# Patient Record
Sex: Female | Born: 1970 | Hispanic: No | Marital: Single | Smoking: Never smoker
Health system: Southern US, Community
[De-identification: ages and names within clinical notes are randomized; demographics above are authoritative.]

## PROBLEM LIST (undated history)

## (undated) ENCOUNTER — Emergency Department (HOSPITAL_BASED_OUTPATIENT_CLINIC_OR_DEPARTMENT_OTHER)

## (undated) DIAGNOSIS — I5189 Other ill-defined heart diseases: Secondary | ICD-10-CM

## (undated) DIAGNOSIS — F329 Major depressive disorder, single episode, unspecified: Secondary | ICD-10-CM

## (undated) DIAGNOSIS — R011 Cardiac murmur, unspecified: Secondary | ICD-10-CM

## (undated) DIAGNOSIS — R87619 Unspecified abnormal cytological findings in specimens from cervix uteri: Secondary | ICD-10-CM

## (undated) DIAGNOSIS — G479 Sleep disorder, unspecified: Secondary | ICD-10-CM

## (undated) DIAGNOSIS — R519 Headache, unspecified: Secondary | ICD-10-CM

## (undated) DIAGNOSIS — G8929 Other chronic pain: Secondary | ICD-10-CM

## (undated) DIAGNOSIS — F419 Anxiety disorder, unspecified: Secondary | ICD-10-CM

## (undated) DIAGNOSIS — N809 Endometriosis, unspecified: Secondary | ICD-10-CM

## (undated) DIAGNOSIS — F32A Depression, unspecified: Secondary | ICD-10-CM

## (undated) DIAGNOSIS — R51 Headache: Secondary | ICD-10-CM

## (undated) DIAGNOSIS — F429 Obsessive-compulsive disorder, unspecified: Secondary | ICD-10-CM

## (undated) DIAGNOSIS — R000 Tachycardia, unspecified: Secondary | ICD-10-CM

## (undated) DIAGNOSIS — A630 Anogenital (venereal) warts: Secondary | ICD-10-CM

## (undated) DIAGNOSIS — I341 Nonrheumatic mitral (valve) prolapse: Secondary | ICD-10-CM

## (undated) HISTORY — PX: OTHER SURGICAL HISTORY: SHX169

## (undated) HISTORY — DX: Other chronic pain: G89.29

## (undated) HISTORY — DX: Other ill-defined heart diseases: I51.89

## (undated) HISTORY — DX: Unspecified abnormal cytological findings in specimens from cervix uteri: R87.619

## (undated) HISTORY — DX: Depression, unspecified: F32.A

## (undated) HISTORY — DX: Major depressive disorder, single episode, unspecified: F32.9

## (undated) HISTORY — DX: Anxiety disorder, unspecified: F41.9

## (undated) HISTORY — DX: Nonrheumatic mitral (valve) prolapse: I34.1

## (undated) HISTORY — DX: Headache: R51

## (undated) HISTORY — DX: Headache, unspecified: R51.9

## (undated) HISTORY — DX: Sleep disorder, unspecified: G47.9

## (undated) HISTORY — DX: Tachycardia, unspecified: R00.0

## (undated) HISTORY — DX: Obsessive-compulsive disorder, unspecified: F42.9

## (undated) HISTORY — DX: Endometriosis, unspecified: N80.9

## (undated) HISTORY — DX: Cardiac murmur, unspecified: R01.1

## (undated) HISTORY — DX: Anogenital (venereal) warts: A63.0

---

## 1999-07-26 ENCOUNTER — Other Ambulatory Visit: Admission: RE | Admit: 1999-07-26 | Discharge: 1999-07-26 | Payer: Self-pay | Admitting: Obstetrics and Gynecology

## 2003-01-28 HISTORY — PX: FOOT SURGERY: SHX648

## 2003-05-29 ENCOUNTER — Other Ambulatory Visit: Admission: RE | Admit: 2003-05-29 | Discharge: 2003-05-29 | Payer: Self-pay | Admitting: Obstetrics and Gynecology

## 2003-11-13 ENCOUNTER — Encounter (INDEPENDENT_AMBULATORY_CARE_PROVIDER_SITE_OTHER): Payer: Self-pay | Admitting: Specialist

## 2003-11-13 ENCOUNTER — Ambulatory Visit (HOSPITAL_COMMUNITY): Admission: RE | Admit: 2003-11-13 | Discharge: 2003-11-13 | Payer: Self-pay | Admitting: Obstetrics and Gynecology

## 2003-11-13 HISTORY — PX: PELVIC LAPAROSCOPY: SHX162

## 2004-07-23 ENCOUNTER — Other Ambulatory Visit: Admission: RE | Admit: 2004-07-23 | Discharge: 2004-07-23 | Payer: Self-pay | Admitting: *Deleted

## 2005-09-09 ENCOUNTER — Other Ambulatory Visit: Admission: RE | Admit: 2005-09-09 | Discharge: 2005-09-09 | Payer: Self-pay | Admitting: Obstetrics & Gynecology

## 2006-12-21 ENCOUNTER — Other Ambulatory Visit: Admission: RE | Admit: 2006-12-21 | Discharge: 2006-12-21 | Payer: Self-pay | Admitting: Obstetrics & Gynecology

## 2007-01-19 ENCOUNTER — Encounter: Admission: RE | Admit: 2007-01-19 | Discharge: 2007-01-19 | Payer: Self-pay | Admitting: Obstetrics & Gynecology

## 2007-01-28 DIAGNOSIS — R87619 Unspecified abnormal cytological findings in specimens from cervix uteri: Secondary | ICD-10-CM

## 2007-01-28 HISTORY — DX: Unspecified abnormal cytological findings in specimens from cervix uteri: R87.619

## 2007-03-22 ENCOUNTER — Other Ambulatory Visit: Admission: RE | Admit: 2007-03-22 | Discharge: 2007-03-22 | Payer: Self-pay | Admitting: Obstetrics & Gynecology

## 2007-03-28 HISTORY — PX: COLPOSCOPY: SHX161

## 2007-09-08 ENCOUNTER — Other Ambulatory Visit: Admission: RE | Admit: 2007-09-08 | Discharge: 2007-09-08 | Payer: Self-pay | Admitting: Obstetrics & Gynecology

## 2007-09-22 ENCOUNTER — Other Ambulatory Visit: Admission: RE | Admit: 2007-09-22 | Discharge: 2007-09-22 | Payer: Self-pay | Admitting: Obstetrics and Gynecology

## 2008-03-30 ENCOUNTER — Other Ambulatory Visit: Admission: RE | Admit: 2008-03-30 | Discharge: 2008-03-30 | Payer: Self-pay | Admitting: Obstetrics & Gynecology

## 2009-10-26 ENCOUNTER — Ambulatory Visit: Payer: Self-pay | Admitting: Family Medicine

## 2009-10-26 DIAGNOSIS — S0083XA Contusion of other part of head, initial encounter: Secondary | ICD-10-CM | POA: Insufficient documentation

## 2009-10-26 DIAGNOSIS — S0003XA Contusion of scalp, initial encounter: Secondary | ICD-10-CM | POA: Insufficient documentation

## 2009-10-26 DIAGNOSIS — R51 Headache: Secondary | ICD-10-CM | POA: Insufficient documentation

## 2009-10-26 DIAGNOSIS — R519 Headache, unspecified: Secondary | ICD-10-CM | POA: Insufficient documentation

## 2009-10-26 DIAGNOSIS — S1093XA Contusion of unspecified part of neck, initial encounter: Secondary | ICD-10-CM

## 2010-02-26 NOTE — Assessment & Plan Note (Signed)
Summary: NP/HEAD AND FACE INJURY/LP   Vital Signs:  Patient profile:   40 year old female Pulse rate:   103 / minute BP sitting:   121 / 83 BP standing:   127 / 85  History of Present Illness: 40 yo F here for head/face injury  Patient reports that yesterday she had lesions biopsied from face by another physician. Today she was looking at face and felt like she was going to pass out upon seeing blood in these areas. She tried to walk back to her bed but before she could do so, she lost consciousness while standing and fell between washing machine and a sink. Mother stated she heard two thud noises - patient thinks she may have hit back of head on something then the ground. No chest pain, shortness of breath, palpitations prior to this. Thinks at most she was unconscious for 2 minutes. When came to, felt dizzy especially with standing and headache occipital/temporal. Face also hurts - swollen over nasal bridge mildly and has bruised left eye. Able to move eyes completely.  Dizziness is now gone - only with headache. No nausea or vomiting. No confusion. No prior h/o concussion. No changes in vision, speech, numbness/tingling or weakness in extremities. Did say it hurts some to look to the left side in her left eye.  Problems Prior to Update: None  Allergies (verified): 1)  Vicodin (Hydrocodone-Acetaminophen)  Family History: + DM in grandfather.  HTN in grandmother and grandfather.  Negative for heart disease.  Social History: nonsmoker, non drinker.  works in Warehouse manager at [employer]  Physical Exam  General:  mildly anxious but in NAD Head:  Bruising over left upper eyelid.  Small superficial hemostatic laceration over nasal bridge with mild swelling.  Small hemostatic area over right eyebrow (from biopsy yesterday).  Eyes:  PERRL.  EOMI.  No stepoffs or crepitus around orbits Ears:  TMs glossy white without blood. Nose:  No stepoffs, mild swelling.  No crepitation.   Tender bilateral nasal bones.  No epistaxis Mouth:  no loose teeth.  No blood in oropharynx. Lungs:  Normal respiratory effort, chest expands symmetrically. Lungs are clear to auscultation, no crackles or wheezes. Heart:  Mildly tachycardic, regular rhythm. S1 and S2 normal without gallop, murmur, click, rub or other extra sounds. Neurologic:  Alert and oriented x3, CN 2-12 grossly intact, Strength grossly intact.  Sensation and MSRs not tested.  Gait normal.  Romberg negative.  Finger to nose normal.  Normal one leg balance bilaterally.  Memory registration and recall intact.  Able to say months of year backwards and do reverse number sequencing without difficulty.   Impression & Recommendations:  Problem # 1:  HEADACHE (ICD-784.0) Assessment New Patient may have had a mild concussion (her headache and dizziness post-accident indicate this) though aside from headache, all other testing is negative.  Discussed relative rest (does not exercise), try meloxicam or advil for pain (but not both) with tramadol for severe pain.  Ice over nasal bridge and left eye - see instructions.  Discussed red flags (persistent vomiting, confusion) though getting further from window for acute intracranial hemorrhage and exam normal outside of headache.    Her updated medication list for this problem includes:    Meloxicam 15 Mg Tabs (Meloxicam) .Marland Kitchen... 1 tab by mouth daily with food for pain and inflammation    Tramadol Hcl 50 Mg Tabs (Tramadol hcl) .Marland Kitchen... 1 tab by mouth qid as needed severe pain  Problem # 2:  CONTUSION,  FACE (ICD-920) Assessment: New Discussed if she had a small crack in nasal bones, may not be seen initially due to swelling.  No indication on exam of obvious fracture requiring reduction at this time (and discussed would wait 1 week before attempting reduction if one was present so swelling could subside).  Meloxicam, tramadol, icing.  If appears asymmetric in 1 week, to return for reevaluation of  possible nasal fracture - we discussed x-rays would then be attempted but are difficult at assessing subtle nasal fractures.  Complete Medication List: 1)  Meloxicam 15 Mg Tabs (Meloxicam) .Marland Kitchen.. 1 tab by mouth daily with food for pain and inflammation 2)  Tramadol Hcl 50 Mg Tabs (Tramadol hcl) .Marland Kitchen.. 1 tab by mouth qid as needed severe pain 3)  Yaz 3-0.02 Mg Tabs (Drospirenone-ethinyl estradiol) 4)  Glucosamine 500 Mg Caps (Glucosamine sulfate) 5)  Daily Multiple Vitamins Tabs (Multiple vitamin) 6)  Ativan 0.5 Mg Tabs (Lorazepam)  Patient Instructions: 1)  You had a vasovagal reaction (passing out from benign causes) and may have had a mild concussion that has since cleared by testing. 2)  Take meloxicam 15mg  daily with food (do not take advil with this - you can take advil 600mg  every 6 hours INSTEAD of this though if you choose). 3)  Ice your nasal bridge for 15 minutes at a time 3-4 times a day at least - put a cloth under the ice. 4)  Take tramadol as needed for severe pain - if this makes you sleepy do not drive while taking this medicine.  Start by splitting this in half to make sure it doesn't make you nauseous. 5)  You do not have evidence of a nasal or orbital fracture on exam or intracranial injury.  Sometimes a small crack in nasal bones is not felt on exam or appreciated because of swelling - if this looks asymmetric in a week when the swelling goes down, make an appointment to come back and see me.  Otherwise follow up with me as needed. 6)  It may take 2-3 weeks for the bruising over your eye to go completely away. Prescriptions: TRAMADOL HCL 50 MG TABS (TRAMADOL HCL) 1 tab by mouth qid as needed severe pain  #60 x 0   Entered and Authorized by:   Norton Blizzard MD   Signed by:   Norton Blizzard MD on 10/26/2009   Method used:   Print then Give to Patient   RxID:   [id number] MELOXICAM 15 MG TABS (MELOXICAM) 1 tab by mouth daily with food for pain and inflammation  #30 x 1    Entered and Authorized by:   Norton Blizzard MD   Signed by:   Norton Blizzard MD on 10/26/2009   Method used:   Print then Give to Patient   RxID:   [id number]

## 2010-03-15 ENCOUNTER — Ambulatory Visit: Payer: Self-pay | Admitting: Family Medicine

## 2010-03-15 ENCOUNTER — Telehealth: Payer: Self-pay | Admitting: Family Medicine

## 2010-03-20 ENCOUNTER — Telehealth: Payer: Self-pay | Admitting: Family Medicine

## 2010-03-20 DIAGNOSIS — F0781 Postconcussional syndrome: Secondary | ICD-10-CM | POA: Insufficient documentation

## 2010-03-20 NOTE — Progress Notes (Signed)
  Phone Note Call from Patient   Caller: Patient Summary of Call: Spoke with patient - since injury she has continued to have headaches, most recently associated with blurry vision.  Also with panic attacks.  She was going to come in today but instead was seen in past week at [facility] - states they did CT scan of head which was negative and prescribed nortriptyline which patient did not take because of risk of swelling.  She is having records faxed here but they have not arrived yet.  Also saw her ophathlmologist and eyes appeared normal per her report.  Advised her I would call her after reviewing records on how to proceed - likely with neurology referral as next step. Initial call taken by: Norton Blizzard MD,  March 15, 2010 4:25 PM

## 2010-03-26 NOTE — Progress Notes (Signed)
  Phone Note Outgoing Call   Call placed by: Norton Blizzard MD,  March 20, 2010 10:23 AM Summary of Call: Spoke with patient while she was at work.  She has been having headaches, blurred vision, dizziness off and on worse since her fall at home back in September.  Reviewed her records from [place] at [facility].  Neurologic exam appears normal.  CT scan without contrast was negative.  She was given a script for nortriptyline for probable migraines but did not take because of side effects.  Since scan she thinks symptoms are somewhat better but only partially.  Ophthalmologic exam normal as well.  Advised I feel she should see a neurologist at this point given length of symptoms and having more than just headaches.  Likely postconcussive syndrome with underlying migraine history as well.  She has been to [facility] in past - will refer back to them. 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

## 2010-04-04 ENCOUNTER — Telehealth: Payer: Self-pay | Admitting: Family Medicine

## 2010-04-09 NOTE — Progress Notes (Signed)
Summary: Wants second neurology referral  ---- 04/03/2010 12:29 PM, Judge Stall wrote: Received phone call at 12:10 today wanting another neurology referral - this time to [facility].  I explained that we had already referred her to a neurologist and that if she wanted further referral that she needed to contact her primary care physician. ------------------------------ Patient has called office on a couple occasions since her neurology consultation with [facility] which was last week.  At that visit she was diagnosed with chronic post-traumatic headache.  She was offered four medications to help treat this (elavil, neurontin, topamax, tizanadine) but declined all of these stating she was concerned about side effects.  They noted if she were to change her mind they would prescribe any to help with her symptoms.  Patient wants a second opinion from [facility] - we had arranged her referral for this location initially but she wanted a sooner appointment so we called several neurology groups in the area and were able to get her in with the group that she saw last week.  Advised her if she would like a second opinion she will have to contact her PCP or contact the neurology group she saw to consider medications that were discussed.  Norton Blizzard MD  April 04, 2010 8:42 AM

## 2010-06-14 NOTE — Op Note (Signed)
NAMEMARYAN, Frances Ho               ACCOUNT NO.:  [id number]   MEDICAL RECORD NO.:  [id number]          PATIENT TYPE:  AMB   LOCATION:  [facility]                           FACILITY:  [facility]   PHYSICIAN:  Cynthia P. Romine, M.D.DATE OF BIRTH:  October 01, 1970   DATE OF PROCEDURE:  11/13/2003  DATE OF DISCHARGE:                                 OPERATIVE REPORT   PREOPERATIVE DIAGNOSES:  Pelvic and leg pain and right ovarian complex cyst  consistent with endometriosis.   POSTOPERATIVE DIAGNOSES:  Bilateral endometriomas, extensive adhesions  involving the pelvis.   PROCEDURE:  Diagnostic laparoscopy, removal of bilateral endometriomas,  extensive lysis of adhesions.   SURGEON:  Cynthia P. Romine, M.D.   ASSISTANT:  Andres Ege, M.D.   ANESTHESIA:  General endotracheal.   ESTIMATED BLOOD LOSS:  50 mL.   COMPLICATIONS:  None.   DESCRIPTION OF PROCEDURE:  The patient was taken to the operating room and  after the induction of adequate general endotracheal anesthesia was prepped  and draped in the usual fashion, placed in the dorsal lithotomy position.  The bladder was drained with a Foley catheter. An acorn uterine manipulator  was placed. Attention was next turned to the abdomen and vertical  infraumbilical incision was made. The Veress needle was inserted into the  peritoneal space. Proper placement was tested by noting a negative aspirate  through the Veress needle with free flow of saline through the Veress needle  again with a negative aspirate and then by noting the response of saline  placed at the hub of the Veress needle to negative pressure as the abdominal  wall was elevated. Pneumoperitoneum was created with 2 liters of CO2 using  the automatic insufflator. A disposable 10/11 mm trocar was then inserted  into the peritoneal space and its proper placement noted with the  laparoscope. Next, two small 5 mm suprapubic incisions were made and the  lower trocars were inserted  under direct visualization. The pelvis was  inspected, the left ovary was enlarged and was adherent to the fundus of the  uterus. The tube was dilated from its proximal portion all the way out to  the fimbria although the fimbria appeared normal and were not clubbed. The  right ovary was socked into the cul-de-sac and adherent to the right pelvic  sidewall, the right tube appeared normal. The cul-de-sac was approximately  50% obliterated with adhesions. The anterior cul-de-sac was free.  The  appendix was enlarged and was filled with a white hard material that made  the appendix round rather than oblong like it is usually. Photographic  documentation was taken.  In order to begin the procedure, it is necessary  to begin with lysing adhesions between the colon and the fundus and the left  ovary. The harmonic scalpel was used to accomplish that goal, lysing  adhesions from the small and large bowel especially on the left between the  bowel and the posterior aspect of the fundus and the ovary that was adherent  to the fundus.  Once the bowel could be pushed away, the ovary could be seen  to be densely adherent along its apex to the fundus of the uterus.  Blunt  and sharp dissection were used to remove the ovary off the fundus of the  uterus and in the process of doing so, the endometrioma in that ovary was  ruptured and exuded a moderately large amount of chocolate brown fluid.  Once the ovary was off the uterus, the cul-de-sac became visible, it was  obliterated about 50% with adhesions. These were taken down careful with  meticulous dissection using the harmonic scalpel.  Once the adhesions in the  cul-de-sac were free then the right ovary became visible. It was densely  adherent to the right pelvic sidewall and contained an endometrioma which  also during the dissection of the ovary off the right pelvic sidewall was  ruptured and exuded and a moderate amount of chocolate brown fluid. Once  the  endometrioma had been ruptured, the ovary could be grasped at the site of  the rupture of the endometrioma. Grasping the ovary and pulling it away from  the sidewall to provide traction so that a plane could be developed between  the sidewall and the ovary, the large vessels could be seen separately and  rolled out of the field as was the ureter. Tedious and meticulous dissection  was used to get the ovary free from the right pelvic sidewall and once the  ovary could be mobilized up it could be seen that approximately 33% of the  ovary had been involved in this endometrioma.  The harmonic scalpel was used  to excise the endometrioma and the specimen was sent to pathology. Good  hemostasis was noted. On the left ovary, the endometrioma involved  approximately a third of the ovary and again the harmonic scalpel was used  to excise the part of the ovary containing the endometrioma.  The base of  the endometrioma was cauterized with bipolar cautery and bipolar was used to  achieve hemostasis on the left ovary.  The cul-de-sac was inspected and  there were raw edges but the cul-de-sac was free and the uterosacral  ligaments on both sides could be visualized.  The anterior cul-de-sac  appeared normal. Intercede was introduced into the pelvis and was placed  over the defect in the right and left ovary. Photographic documentation was  taken and the procedure was terminated. The instruments removed from the  abdomen, the pneumoperitoneum was allowed to escape, the sleeves were  removed, the incisions were closed subcuticularly with 4-0 Vicryl Rapide,  instruments removed from the vagina and the procedure was terminated. The  patient was taken to the recovery room in satisfactory condition.      CPR/MEDQ  D:  11/13/2003  T:  11/14/2003  Job:  [id number]

## 2010-06-21 ENCOUNTER — Encounter: Payer: Self-pay | Admitting: Emergency Medicine

## 2010-07-03 ENCOUNTER — Ambulatory Visit (INDEPENDENT_AMBULATORY_CARE_PROVIDER_SITE_OTHER): Payer: 59 | Admitting: Emergency Medicine

## 2010-07-03 ENCOUNTER — Encounter: Payer: Self-pay | Admitting: Emergency Medicine

## 2010-07-03 DIAGNOSIS — J302 Other seasonal allergic rhinitis: Secondary | ICD-10-CM

## 2010-07-03 DIAGNOSIS — R0989 Other specified symptoms and signs involving the circulatory and respiratory systems: Secondary | ICD-10-CM

## 2010-07-03 DIAGNOSIS — J309 Allergic rhinitis, unspecified: Secondary | ICD-10-CM

## 2010-07-03 DIAGNOSIS — R0609 Other forms of dyspnea: Secondary | ICD-10-CM

## 2010-07-03 DIAGNOSIS — R0600 Dyspnea, unspecified: Secondary | ICD-10-CM

## 2010-07-03 NOTE — Assessment & Plan Note (Signed)
Suspect some deconditioning and restriction from wearing back brace. ? Also component of nasal obstruction and inability to move air thru nasal passages.  - check full PFT - rov to discuss - she wants to defer allergy meds at this time

## 2010-07-03 NOTE — Progress Notes (Signed)
  Subjective:    Patient ID: Frances Ho, female    DOB: 09-Jun-1970, 40 y.o.   MRN: [id number]  HPI    Review of Systems  HENT: Positive for congestion.   Respiratory: Positive for shortness of breath.   Gastrointestinal: Positive for abdominal pain.  Musculoskeletal: Positive for joint swelling.  Neurological: Positive for headaches.  Psychiatric/Behavioral:       Depression anxiety       Objective:   Physical Exam        Assessment & Plan:

## 2010-07-03 NOTE — Progress Notes (Signed)
  Subjective:    Patient ID: Frances Ho, female    DOB: [birth year], 40 y.o.   MRN: [id number]  HPI 40 yo never smoker, hx trauma and head injury, post-concussion HA's, MV prolapse, tells me that she noticed DOE over about 5 months, ? Associated with tachycardia, occurs also at rest, esp when lying down - ? Whether she has sinus or nasal obstruction contributing. The exertional SOB seems to be worsening, she was worse when walking yesterday. She wears a back brace that is like a belt, compresses her abdomen and may be contributing. Has gained 15 lbs in the last year. CXR done 5/29 was normal.  She notes that her exercise routine decreased and that deconditioning could be conditioning. She does have some allergy symptoms.    Review of Systems As per HPI  Past Medical History  Diagnosis Date  . Chronic headaches   . Tachycardia      Family History  Problem Relation Age of Onset  . Diabetes      grandfather  . Hypertension      grandmother  . Hypertension      grandfather  . Heart disease Maternal Grandmother   . Heart disease Maternal Grandfather   . Heart disease Paternal Grandmother   . Heart disease Paternal Grandfather      History   Social History  . Marital Status: Single    Spouse Name: N/A    Number of Children: N/A  . Years of Education: N/A   Occupational History  . clerical at [employer]    Social History Main Topics  . Smoking status: Never Smoker   . Smokeless tobacco: Not on file  . Alcohol Use: No  . Drug Use: No  . Sexually Active: Not on file   Other Topics Concern  . Not on file   Social History Narrative  . No narrative on file     No Known Allergies   Outpatient Prescriptions Prior to Visit  Medication Sig Dispense Refill  . Glucosamine 500 MG CAPS Take 750 mg by mouth daily.       . Multiple Vitamin (MULTIVITAMIN) tablet        . drospirenone-ethinyl estradiol (YAZ) 3-0.02 MG per tablet        . LORazepam (ATIVAN) 0.5 MG tablet as  needed.        . meloxicam (MOBIC) 15 MG tablet Take 15 mg by mouth daily.        . traMADol (ULTRAM) 50 MG tablet Take 50 mg by mouth every 6 (six) hours as needed.             Objective:   Physical Exam  Gen: Pleasant, well-nourished, in no distress,  normal affect  ENT: No lesions,  mouth clear,  oropharynx clear, no postnasal drip  Neck: No JVD, no TMG, no carotid bruits  Lungs: No use of accessory muscles, no dullness to percussion, clear without rales or rhonchi  Cardiovascular: RRR, heart sounds normal, no murmur or gallops, no peripheral edema  Musculoskeletal: No deformities, no cyanosis or clubbing  Neuro: alert, non focal  Skin: Warm, no lesions or rashes      Assessment & Plan:  Dyspnea Suspect some deconditioning and restriction from wearing back brace. ? Also component of nasal obstruction and inability to move air thru nasal passages.  - check full PFT - rov to discuss - she wants to defer allergy meds at this time

## 2010-07-03 NOTE — Patient Instructions (Signed)
We will perform full pulmonary function testing on the same day as your next appointment Follow with Dr Delton Coombes next available opening

## 2010-07-30 ENCOUNTER — Encounter: Payer: Self-pay | Admitting: Emergency Medicine

## 2010-07-30 ENCOUNTER — Ambulatory Visit (INDEPENDENT_AMBULATORY_CARE_PROVIDER_SITE_OTHER): Payer: 59 | Admitting: Emergency Medicine

## 2010-07-30 ENCOUNTER — Encounter: Payer: Self-pay | Admitting: *Deleted

## 2010-07-30 DIAGNOSIS — G4733 Obstructive sleep apnea (adult) (pediatric): Secondary | ICD-10-CM | POA: Insufficient documentation

## 2010-07-30 DIAGNOSIS — R0989 Other specified symptoms and signs involving the circulatory and respiratory systems: Secondary | ICD-10-CM

## 2010-07-30 DIAGNOSIS — G479 Sleep disorder, unspecified: Secondary | ICD-10-CM

## 2010-07-30 DIAGNOSIS — R0600 Dyspnea, unspecified: Secondary | ICD-10-CM

## 2010-07-30 DIAGNOSIS — R0609 Other forms of dyspnea: Secondary | ICD-10-CM

## 2010-07-30 HISTORY — DX: Sleep disorder, unspecified: G47.9

## 2010-07-30 LAB — PULMONARY FUNCTION TEST

## 2010-07-30 NOTE — Patient Instructions (Signed)
Your pulmonary function testing is normal We will arrange for a home sleep test Follow up with Dr Delton Coombes in 6 weeks to review the test

## 2010-07-30 NOTE — Progress Notes (Signed)
  Subjective:    Patient ID: Frances Ho, female    DOB: 04-08-1970, 40 y.o.   MRN: [id number]  HPI 40 yo never smoker, hx trauma and head injury, post-concussion HA's, MV prolapse, tells me that she noticed DOE over about 5 months, ? Associated with tachycardia, occurs also at rest, esp when lying down - ? Whether she has sinus or nasal obstruction contributing. The exertional SOB seems to be worsening, she was worse when walking yesterday. She wears a back brace that is like a belt, compresses her abdomen and may be contributing. Has gained 15 lbs in the last year. CXR done 5/29 was normal.  She notes that her exercise routine decreased and that deconditioning could be conditioning. She does have some allergy symptoms.   ROV 07/30/10 -- returns after PFT's today. She tells me that she is feeling much better, since last time she stopped using the back brace and her breathing has improved. As an aside she tells me that she is having sleep sx - daytime somnulence, poor sleep, etc. PFT today are normal.   Review of Systems As per HPI    Objective:   Physical Exam  Gen: Pleasant, well-nourished, in no distress,  normal affect  ENT: No lesions,  mouth clear,  oropharynx clear, no postnasal drip  Neck: No JVD, no TMG, no carotid bruits  Lungs: No use of accessory muscles, no dullness to percussion, clear without rales or rhonchi  Cardiovascular: RRR, heart sounds normal, no murmur or gallops, no peripheral edema  Musculoskeletal: No deformities, no cyanosis or clubbing  Neuro: alert, non focal  Skin: Warm, no lesions or rashes      Assessment & Plan:   Dyspnea PFT's normal, suspect restriction due to her back brace, which she is no longer using.   Sleep disturbance Will arrange for a home PSG

## 2010-07-30 NOTE — Progress Notes (Signed)
PFT done today. 

## 2010-07-30 NOTE — Assessment & Plan Note (Signed)
Will arrange for a home PSG

## 2010-07-30 NOTE — Assessment & Plan Note (Signed)
PFT's normal, suspect restriction due to her back brace, which she is no longer using.

## 2010-08-15 ENCOUNTER — Ambulatory Visit (INDEPENDENT_AMBULATORY_CARE_PROVIDER_SITE_OTHER): Payer: 59 | Admitting: Pulmonary Disease

## 2010-08-15 DIAGNOSIS — G479 Sleep disorder, unspecified: Secondary | ICD-10-CM

## 2010-08-16 NOTE — Progress Notes (Signed)
  Subjective:    Patient ID: Frances Ho, female    DOB: [birth year], 40 y.o.   MRN: [id number]  HPI  Home sleep study (Apnealink) reviewed : Flow evaluation x 7h AHI 1/h Desaturation index 3/h 10 min saturation < 88% Impression- No evidence of sleep disordered breathing. Mild sleep related hypoxia - please corelate with underlying cardiopulmonary disease.  Review of Systems     Objective:   Physical Exam        Assessment & Plan:

## 2010-08-20 ENCOUNTER — Encounter: Payer: Self-pay | Admitting: Emergency Medicine

## 2010-08-22 ENCOUNTER — Telehealth: Payer: Self-pay | Admitting: Emergency Medicine

## 2010-08-22 NOTE — Telephone Encounter (Signed)
Called and spoke with pt.  Pt states she dropped off apnea link last week and still hasn't heard results yet.  Pt requesting these results asap.  RB, please advise.  Thanks.

## 2010-08-23 NOTE — Telephone Encounter (Signed)
Pt called again for sleep results.Frances Ho

## 2010-08-23 NOTE — Telephone Encounter (Signed)
Called spoke with patient, advised of apnea link results / recs as stated by CDY.  Pt verbalized her understanding but still expressed concern because she wakes up exhausted every morning and reports that it takes her "a long time to become oriented."  Pt states that she wakes up usually once per night to use the bathroom and gets approx 6-7 hours of sleep per night.  Pt requesting now to see sleep physician and would like appt before her scheduled follow up with RB on 8.20.12.  appt for self-referral made with VS for 8.14.12 per pt's request.

## 2010-08-23 NOTE — Telephone Encounter (Signed)
Called spoke with patient who is requesting her apnea link results be read ASAP.  She is becoming concerned and wants this taken care of today.  Advised that RB is not in the office and neither is KC who pt states read the report.  Pt asked why I cannot give her the results and I informed her that only the provider can tell the nurse what the results are for [place] to relay them to the patient.  Pt verbalized her understanding but again requests this be taken care of today.  Dr Maple Hudson, as the only sleep physician in the office can you read pt's apnea link and provide results?  Thanks!  Copy of apnea link printed off and placed with phone note.

## 2010-08-23 NOTE — Telephone Encounter (Signed)
Apnea link does not show significant sleep apnea. Oxygen level fell for short amounts of time, but no emergency,and she can discuss that with Dr Delton Coombes next week.

## 2010-09-10 ENCOUNTER — Ambulatory Visit (INDEPENDENT_AMBULATORY_CARE_PROVIDER_SITE_OTHER): Payer: 59 | Admitting: Pulmonary Disease

## 2010-09-10 ENCOUNTER — Encounter: Payer: Self-pay | Admitting: *Deleted

## 2010-09-10 ENCOUNTER — Encounter: Payer: Self-pay | Admitting: Pulmonary Disease

## 2010-09-10 VITALS — BP 102/58 | HR 94 | Temp 98.6°F | Ht 62.0 in | Wt 149.0 lb

## 2010-09-10 DIAGNOSIS — G479 Sleep disorder, unspecified: Secondary | ICD-10-CM

## 2010-09-10 NOTE — Progress Notes (Signed)
Subjective:    Patient ID: Frances Ho, female    DOB: [birth year], 40 y.o.   MRN: [id number]  HPI 40 yo female with sleep trouble.  She has noticed a problem with her sleep for years.  This has been getting worse since she fell and hit her head in Sept 2011.  She is not sure if she had a concussion then.  She does get frequent headaches.  She denies any history of seizures.  She had CT head in February 2012, and was told this was normal.  She had home sleep test in July 2012; this showed AHI 1 and spent 10 min with SpO2 < 88%.  She is does not recall sleeping much that night.  She was worried about knocking the device off while asleep.  She goes to bed at 10 pm.  She will take ativan once per week.  She has also tried using OTC sleep aides.  These help, but she gets a hangover affect the next morning.  It can take up to an hour to fall asleep.  She wakes up several times during the night, especially when sleeping on her back.  She drools at night and her mouth will get dry.  She sleeps alone, and is not sure if she snores or stops breathing while asleep.  She gets out of bed at 645 am, but feels lousy.  She is tired throughout the day.  She is not using anything to help stay awake.  She naps occasionally on the weekends when she has time.  She will still feel tired even if she can sleep for 12 hours.  She used to grind her teeth as a child, but is not sure if she does this as an adult.  The patient denies sleep walking, sleep talking, or nightmares.  There is no history of restless legs.  The patient denies sleep hallucinations, sleep paralysis, or cataplexy.  She does occasional get sinus congestion and allergies.  There is no history of thyroid disease.  She will occasionally feel depressed, but never have been diagnosed with depression.  She is often late for work due to oversleeping.  Epworth score is 11 out of 24.  Past Medical History  Diagnosis Date  . Chronic headaches   . Tachycardia     . Mitral valve prolapse      Family History  Problem Relation Age of Onset  . Diabetes      grandfather  . Hypertension      grandmother  . Hypertension      grandfather  . Heart disease Maternal Grandmother   . Heart disease Maternal Grandfather   . Heart disease Paternal Grandmother   . Heart disease Paternal Grandfather      History   Social History  . Marital Status: Single    Spouse Name: N/A    Number of Children: N/A  . Years of Education: N/A   Occupational History  . clerical at [employer]    Social History Main Topics  . Smoking status: Never Smoker   . Smokeless tobacco: Not on file  . Alcohol Use: No  . Drug Use: No  . Sexually Active: Not on file   Other Topics Concern  . Not on file   Social History Narrative  . No narrative on file     No Known Allergies   Outpatient Prescriptions Prior to Visit  Medication Sig Dispense Refill  . Calcium Carbonate-Vitamin D (CALCIUM 600+D) 600-400 MG-UNIT per tablet  Take 1 tablet by mouth 2 (two) times daily.        . Cholecalciferol (VITAMIN D3) 1000 UNITS CAPS Take 1 capsule by mouth daily.        . drospirenone-ethinyl estradiol (YAZ) 3-0.02 MG per tablet        . Flaxseed, Linseed, (FLAXSEED OIL) 1000 MG CAPS Take 1 capsule by mouth daily.        . folic acid (FOLVITE) 800 MCG tablet Take 800 mcg by mouth daily.        . Glucosamine 500 MG CAPS Take 750 mg by mouth daily.       Marland Kitchen LORazepam (ATIVAN) 0.5 MG tablet as needed.        . Methylsulfonylmethane (MSM) 1000 MG CAPS Take 1 capsule by mouth daily.        . Multiple Vitamin (MULTIVITAMIN) tablet        . Multiple Vitamins-Minerals (HAIR/SKIN/NAILS) TABS Take 2 tablets by mouth daily.        . vitamin E 400 UNIT capsule Take 400 Units by mouth daily.         Review of Systems  Constitutional: Positive for unexpected weight change.  HENT: Positive for congestion.   Respiratory: Positive for shortness of breath.   Cardiovascular: Positive for chest  pain.  Gastrointestinal: Positive for abdominal pain.  Musculoskeletal: Positive for joint swelling.  Neurological: Positive for headaches.  Psychiatric/Behavioral: Positive for dysphoric mood.       Objective:   Physical Exam  BP 102/58  Pulse 94  Temp(Src) 98.6 F (37 C) (Oral)  Ht 5\' 2"  (1.575 m)  Wt 149 lb (67.586 kg)  BMI 27.25 kg/m2  SpO2 100%  General - Healthy HEENT - PERRLA, EOMI, narrow nasal angles, no sinus drainage, MP 4, enlarged tongue, no LAN, no thyromegaly Cardiac - s1s2 with 2/6 SM, pulses symmetric Chest - CTA Abd - thin, soft, nontender, normal bowel sounds Ext - no e/c/c Neuro - normal strength, CN intact Psych - normal mood/behavior     Assessment & Plan:   Sleep disturbance She has sleep disruption and daytime sleepiness.  She has frequent headaches.  She has physical findings suggestive of sleep apnea.  She had recent home sleep test that had AHI in normal range, but she reports not sleeping much while wearing device.  Therefore her sleep apnea severity may be underestimated.  She did have oxygen desaturation while using home sleep test device.  Her recent pulmonary evaluation did not show an alternative explanation for her nocturnal oxygen desaturation.  I am concerned that she does in fact have sleep apnea.  To further assess I will arrange for an in-lab sleep study.  Depending on results will determine if additional testing is required.    Updated Medication List Outpatient Encounter Prescriptions as of 09/10/2010  Medication Sig Dispense Refill  . Calcium Carbonate-Vitamin D (CALCIUM 600+D) 600-400 MG-UNIT per tablet Take 1 tablet by mouth 2 (two) times daily.        . Cholecalciferol (VITAMIN D3) 1000 UNITS CAPS Take 1 capsule by mouth daily.        . drospirenone-ethinyl estradiol (YAZ) 3-0.02 MG per tablet        . Flaxseed, Linseed, (FLAXSEED OIL) 1000 MG CAPS Take 1 capsule by mouth daily.        . folic acid (FOLVITE) 800 MCG tablet Take  800 mcg by mouth daily.        . Glucosamine 500 MG CAPS Take 750 mg  by mouth daily.       Marland Kitchen LORazepam (ATIVAN) 0.5 MG tablet as needed.        . Methylsulfonylmethane (MSM) 1000 MG CAPS Take 1 capsule by mouth daily.        . Multiple Vitamin (MULTIVITAMIN) tablet        . Multiple Vitamins-Minerals (HAIR/SKIN/NAILS) TABS Take 2 tablets by mouth daily.        . vitamin E 400 UNIT capsule Take 400 Units by mouth daily.

## 2010-09-10 NOTE — Patient Instructions (Signed)
Will schedule sleep test Will call to schedule follow up after sleep test reviewed 

## 2010-09-10 NOTE — Assessment & Plan Note (Signed)
She has sleep disruption and daytime sleepiness.  She has frequent headaches.  She has physical findings suggestive of sleep apnea.  She had recent home sleep test that had AHI in normal range, but she reports not sleeping much while wearing device.  Therefore her sleep apnea severity may be underestimated.  She did have oxygen desaturation while using home sleep test device.  Her recent pulmonary evaluation did not show an alternative explanation for her nocturnal oxygen desaturation.  I am concerned that she does in fact have sleep apnea.  To further assess I will arrange for an in-lab sleep study.  Depending on results will determine if additional testing is required.

## 2010-09-16 ENCOUNTER — Ambulatory Visit: Payer: 59 | Admitting: Emergency Medicine

## 2010-09-26 ENCOUNTER — Telehealth: Payer: Self-pay | Admitting: Pulmonary Disease

## 2010-09-26 NOTE — Telephone Encounter (Signed)
Spoke with pt and she states the first sleep study she had she was advised to would be covered. Pt states she received a bill for $500. Pt states she had to send the bill to coding so they can get this taking care of. Insurance advised pt the reason she received a bill was b/c not enough medical documentation was submitted to the insurance. Pt is requesting this be done this go around so she doesn't have to go through that again. Pt states [facility] advised her they would send everything the insurance would need to cover her sleep study cost. Please advise [facility] if we sent supporting documents for pt sleep study to the insurance. Thanks  Carver Fila, CMA

## 2010-09-26 NOTE — Telephone Encounter (Signed)
Called [facility] and got # to fax records for precert for split night npsg 10/04/10

## 2010-10-04 ENCOUNTER — Ambulatory Visit (HOSPITAL_BASED_OUTPATIENT_CLINIC_OR_DEPARTMENT_OTHER): Payer: 59

## 2010-10-20 ENCOUNTER — Encounter (HOSPITAL_BASED_OUTPATIENT_CLINIC_OR_DEPARTMENT_OTHER): Payer: 59

## 2010-10-23 ENCOUNTER — Ambulatory Visit (HOSPITAL_BASED_OUTPATIENT_CLINIC_OR_DEPARTMENT_OTHER): Payer: 59 | Attending: Pulmonary Disease

## 2010-10-23 DIAGNOSIS — G4710 Hypersomnia, unspecified: Secondary | ICD-10-CM | POA: Insufficient documentation

## 2010-10-23 DIAGNOSIS — G479 Sleep disorder, unspecified: Secondary | ICD-10-CM

## 2010-10-31 ENCOUNTER — Encounter: Payer: Self-pay | Admitting: Pulmonary Disease

## 2010-10-31 ENCOUNTER — Telehealth: Payer: Self-pay | Admitting: Pulmonary Disease

## 2010-10-31 DIAGNOSIS — G479 Sleep disorder, unspecified: Secondary | ICD-10-CM

## 2010-10-31 NOTE — Procedures (Signed)
[name], LEY NO.:  [id number]  MEDICAL RECORD NO.:  [id number]          PATIENT TYPE:  OUT  LOCATION:  SLEEP CENTER                 FACILITY:  [facility]  PHYSICIAN:  Coralyn Helling, MD        DATE OF BIRTH:  1970-08-12  DATE OF STUDY:  10/23/2010                           NOCTURNAL POLYSOMNOGRAM  REFERRING PHYSICIAN:  Coralyn Helling, MD  FACILITY:  [facility].  REFERRING PHYSICIAN:  Coralyn Helling, MD.  INDICATION FOR STUDY:  [name] is a 40 year old female who has a history of snoring, sleep disruption, and daytime sleepiness.  She also has frequent headaches in the morning.  She is referred to sleep lab for evaluation of hypersomnia with obstructive sleep apnea.  Height is 5 feet 2 inches.  Weight is 140 pounds.  BMI is 26.  Neck size is 11.5 inches.  EPWORTH SLEEPINESS SCORE:  9.  MEDICATIONS: 1. Flaxseed oil. 2. Multivitamin. 3. Glucosamine. 4. Yaz. 5. Folic acid. 6. The patient took a Tylenol PM on the night of the study.  SLEEP ARCHITECTURE:  Total recording time was 370 minutes.  Total sleep time was 149 minutes.  Sleep efficiency was 40%.  Sleep latency was 75 minutes.  The study was notable for lack of stage III sleep and REM sleep.  The patient slept in both the supine and non-supine positions. The patient appeared to have difficulty with sleep initiation and sleep maintenance due to respiratory events.  RESPIRATORY DATA:  The average respiratory rate was 18.  Moderate snoring was noted by technician.  The overall apnea-hypopnea index was zero.  The respiratory disturbance index was 17.7.  OXYGEN DATA:  The baseline oxygenation was 90%.  The oxygen saturation nadir was 95%.  The study was conducted without the use of supplemental oxygen.  CARDIAC DATA:  The average heart rate was 80 and the rhythm strip showed sinus rhythm.  MOVEMENT-PARASOMNIA:  The periodic limb movement index was zero and the patient had 1 restroom  trip.  IMPRESSIONS-RECOMMENDATIONS:  This study shows evidence for mild-to- moderate sleep disordered breathing with a respiratory disturbance index of 17.7.  Of note is that the patient did not have REM sleep and therefore the severity of sleep disordered breathing may be underestimated.  In addition, she appeared to have difficulty with sleep initiation and sleep maintenance due to difficulty with respiratory events and therefore this may also cause underestimation in the severity of sleep disordered breathing.  I would recommend that the patient consider additional therapeutic options such as CPAP, oral appliance, or surgical intervention.     Coralyn Helling, MD Diplomat, American Board of Sleep Medicine    VS/MEDQ  D:  10/31/2010 11:15:30  T:  10/31/2010 16:29:20  Job:  [id number]

## 2010-10-31 NOTE — Telephone Encounter (Signed)
PSG 10/23/10>>AHI 0, RDI 17.7, SpO2 low 95%.  Will have my nurse schedule ROV to review.

## 2010-11-01 ENCOUNTER — Telehealth: Payer: Self-pay | Admitting: Pulmonary Disease

## 2010-11-01 NOTE — Telephone Encounter (Signed)
UNNECESSARY  

## 2010-11-01 NOTE — Telephone Encounter (Signed)
lmomtcb  

## 2010-11-04 ENCOUNTER — Telehealth: Payer: Self-pay | Admitting: Pulmonary Disease

## 2010-11-04 NOTE — Telephone Encounter (Signed)
I spoke with pt and she states she would like to be sooner than 10/25. Their are no available openings. Pt is requesting for either she come in for to see another provider for these results or Dr. Craige Cotta call her. Please advise Dr. Craige Cotta, thanks  Carver Fila, CMA

## 2010-11-04 NOTE — Telephone Encounter (Signed)
Pt is scheduled 11/21/10 at 2:15

## 2010-11-05 NOTE — Telephone Encounter (Signed)
OK to have her seen another provider (either Dr. Vassie Loll or Dr. Shelle Iron) to review sleep study results if okay with other provider.

## 2010-11-05 NOTE — Telephone Encounter (Signed)
lmomtcb to advise Dr. Vassie Loll does not have any sooner apts. I looked at [facility] schedule as well and he has no available openings either.

## 2010-11-05 NOTE — Telephone Encounter (Signed)
OK  If there are any cancellations DOn' t think I have any openings otherwise.

## 2010-11-05 NOTE — Telephone Encounter (Signed)
Please advise Dr. Vassie Loll if you will be willing to see pt to discuss her sleep results. Thanks  Carver Fila, CMA

## 2010-11-05 NOTE — Telephone Encounter (Signed)
I spoke with pt, since VS had an opening on 10/18 at 4:15 she is going to come in at that time. Nothing further was needed

## 2010-11-14 ENCOUNTER — Ambulatory Visit (INDEPENDENT_AMBULATORY_CARE_PROVIDER_SITE_OTHER): Payer: 59 | Admitting: Pulmonary Disease

## 2010-11-14 ENCOUNTER — Encounter: Payer: Self-pay | Admitting: Pulmonary Disease

## 2010-11-14 VITALS — BP 110/88 | HR 89 | Temp 98.7°F | Ht 62.0 in

## 2010-11-14 DIAGNOSIS — G4733 Obstructive sleep apnea (adult) (pediatric): Secondary | ICD-10-CM

## 2010-11-14 NOTE — Assessment & Plan Note (Signed)
She has moderate obstructive sleep apnea.  I have reviewed his sleep test results with the patient.  Explained how sleep apnea can affect the patient's health.  Driving precautions and importance of weight loss were discussed.  Treatment options for sleep apnea were reviewed.  Will refer to Dr. Althea Grimmer to assess for oral appliance to treat sleep apnea.

## 2010-11-14 NOTE — Patient Instructions (Signed)
Will arrange for appointment with Dr. Althea Grimmer to assess for an oral appliance (mandibular advancement device) to treat sleep apnea You can get more information about sleep apnea from the American Academy of Sleep Medicine website Follow up in 4 months

## 2010-11-14 NOTE — Progress Notes (Signed)
Subjective:    Patient ID: Frances Ho, female    DOB: [birth year], 40 y.o.   MRN: [id number]  HPI 40 yo female with sleep apnea.  She is here to review her sleep study from 10/23/10>>AHI 0, RDI 17.7, SpO2 low 95%.  This is consistent with moderate sleep apnea.  She continues to have trouble with her sleep, feeling sleepy during the day, and headaches during the day.  Past Medical History  Diagnosis Date  . Chronic headaches   . Tachycardia   . Mitral valve prolapse   . OSA (obstructive sleep apnea) 07/30/2010     Family History  Problem Relation Age of Onset  . Diabetes      grandfather  . Hypertension      grandmother  . Hypertension      grandfather  . Heart disease Maternal Grandmother   . Heart disease Maternal Grandfather   . Heart disease Paternal Grandmother   . Heart disease Paternal Grandfather      History   Social History  . 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 Smoking status: Never Smoker   . Alcohol Use: No  . Drug Use: No   No Known Allergies   Outpatient Prescriptions Prior to Visit  Medication Sig Dispense Refill  . drospirenone-ethinyl estradiol (YAZ) 3-0.02 MG per tablet        . Flaxseed, Linseed, (FLAXSEED OIL) 1000 MG CAPS Take 1 capsule by mouth daily.        . folic acid (FOLVITE) 800 MCG tablet Take 800 mcg by mouth daily.        . Glucosamine 500 MG CAPS Take 750 mg by mouth 2 (two) times daily.       Marland Kitchen LORazepam (ATIVAN) 0.5 MG tablet as needed.        . Methylsulfonylmethane (MSM) 1000 MG CAPS Take 1 capsule by mouth daily.        . Multiple Vitamin (MULTIVITAMIN) tablet        . Multiple Vitamins-Minerals (HAIR/SKIN/NAILS) TABS Take 2 tablets by mouth daily.        . vitamin E 400 UNIT capsule Take 400 Units by mouth daily.        . Calcium Carbonate-Vitamin D (CALCIUM 600+D) 600-400 MG-UNIT per tablet Take 1 tablet by mouth daily.       . Cholecalciferol  (VITAMIN D3) 1000 UNITS CAPS Take 1 capsule by mouth daily.         Review of Systems     Objective:   Physical Exam  BP 110/88  Pulse 89  Temp(Src) 98.7 F (37.1 C) (Oral)  Ht 5\' 2"  (1.575 m)  SpO2 100%  General - Healthy  HEENT - PERRLA, EOMI, narrow nasal angles, no sinus drainage, MP 4, enlarged tongue, no LAN, no thyromegaly  Cardiac - s1s2 with 2/6 SM, pulses symmetric  Chest - CTA  Abd - thin, soft, nontender, normal bowel sounds  Ext - no e/c/c  Neuro - normal strength, CN intact  Psych - normal mood/behavior      Assessment & Plan:   OSA (obstructive sleep apnea) She has moderate obstructive sleep apnea.  I have reviewed his sleep test results with the patient.  Explained how sleep apnea can affect the patient's health.  Driving precautions and importance of weight loss were discussed.  Treatment options for sleep apnea were reviewed.  Will refer to Dr. Althea Grimmer  to assess for oral appliance to treat sleep apnea.    Updated Medication List Outpatient Encounter Prescriptions as of 11/14/2010  Medication Sig Dispense Refill  . drospirenone-ethinyl estradiol (YAZ) 3-0.02 MG per tablet        . Flaxseed, Linseed, (FLAXSEED OIL) 1000 MG CAPS Take 1 capsule by mouth daily.        . folic acid (FOLVITE) 800 MCG tablet Take 800 mcg by mouth daily.        . Glucosamine 500 MG CAPS Take 750 mg by mouth 2 (two) times daily.       Marland Kitchen LORazepam (ATIVAN) 0.5 MG tablet as needed.        . Methylsulfonylmethane (MSM) 1000 MG CAPS Take 1 capsule by mouth daily.        . Multiple Vitamin (MULTIVITAMIN) tablet        . Multiple Vitamins-Minerals (HAIR/SKIN/NAILS) TABS Take 2 tablets by mouth daily.        . vitamin E 400 UNIT capsule Take 400 Units by mouth daily.        . Calcium Carbonate-Vitamin D (CALCIUM 600+D) 600-400 MG-UNIT per tablet Take 1 tablet by mouth daily.       Marland Kitchen DISCONTD: Cholecalciferol (VITAMIN D3) 1000 UNITS CAPS Take 1 capsule by mouth daily.

## 2010-11-19 ENCOUNTER — Telehealth: Payer: Self-pay | Admitting: Pulmonary Disease

## 2010-11-19 NOTE — Telephone Encounter (Signed)
I looked at referral and it states Frances Ho did fax over records. Per rhonda send message to them and they will resend sleep study results

## 2010-11-19 NOTE — Telephone Encounter (Signed)
Pt returned triage's call.  Pt can be reached at [phone].  Frances Ho

## 2010-11-19 NOTE — Telephone Encounter (Signed)
Pt aware will refax sleep results to Dr. Myrtis Ser. Please advise [facility], thanks  Frances Ho, CMA

## 2010-11-19 NOTE — Telephone Encounter (Signed)
Sleep Study re-faxed to Dr. Henrietta Hoover office [phone]).

## 2010-11-21 ENCOUNTER — Ambulatory Visit: Payer: 59 | Admitting: Pulmonary Disease

## 2010-11-21 ENCOUNTER — Encounter: Payer: Self-pay | Admitting: Pulmonary Disease

## 2010-12-02 DIAGNOSIS — G4730 Sleep apnea, unspecified: Secondary | ICD-10-CM

## 2010-12-16 ENCOUNTER — Telehealth: Payer: Self-pay | Admitting: Pulmonary Disease

## 2010-12-16 DIAGNOSIS — G4733 Obstructive sleep apnea (adult) (pediatric): Secondary | ICD-10-CM

## 2010-12-16 NOTE — Telephone Encounter (Addendum)
LMOMTCB x 1. Did pt ever try the oral appliance or change her mind?

## 2010-12-16 NOTE — Telephone Encounter (Signed)
I spoke with pt and she states she currently has a temporary oral appliance and it is not helping her. Pt states she has decided she wants try CPAP instead. Please advise Dr. Craige Cotta. thanks

## 2010-12-17 NOTE — Telephone Encounter (Signed)
Please advise her to discuss with dentist first to see if her appliance can be adjusted before switching to CPAP.

## 2010-12-17 NOTE — Telephone Encounter (Signed)
lmomtcb  

## 2010-12-18 NOTE — Telephone Encounter (Signed)
lmomtcb  

## 2010-12-23 NOTE — Telephone Encounter (Signed)
[email or web address] 

## 2010-12-23 NOTE — Telephone Encounter (Signed)
Pt returned triage's call.  Relayed VS's message to pt.  Pt stated that her appliance fits fine, but wants to switch to CPAP.  Pt can be reached in the next 1/2 hour at  [phone] or after 3:00 pm at  [phone].    Antionette Fairy

## 2010-12-23 NOTE — Telephone Encounter (Signed)
Pt has returned triage's call & can be reached at [phone].  Frances Ho

## 2010-12-24 NOTE — Telephone Encounter (Addendum)
Pt states that the temporary oral appliance fits fine but is not helping and she wishes to try cpap. Also her insurance would not cover the oral appliance and she cannot afford to pay $2500 out of pocket. Pls advise.  It is best to reach the pt at [phone] after 10am.

## 2010-12-24 NOTE — Telephone Encounter (Signed)
[email or web address].Tramaine Snell, CMA  

## 2010-12-26 NOTE — Telephone Encounter (Signed)
Pt returning call & can be reached at [phone].  Frances Ho

## 2010-12-26 NOTE — Telephone Encounter (Signed)
ATC pt.  NA and no option to leave message.  Will try again tomorrow.

## 2010-12-26 NOTE — Telephone Encounter (Signed)
Please inform patient that I have sent order for her to get a CPAP machine.

## 2010-12-26 NOTE — Telephone Encounter (Signed)
PT CALLED BACK AGAIN. SHE ASKS THAT THIS PLEASE BE DONE SOON AS  SHE HASN'T HAD SLEEP FOR 9 DAYS- EXHAUSTED. Frances Ho

## 2010-12-26 NOTE — Telephone Encounter (Signed)
lmomtcb  

## 2010-12-27 NOTE — Telephone Encounter (Signed)
I left a detailed msg to let the pt know that [facility] has sent an order for her CPAP machine and to call next week if she has not heard from the DME company, [employer].

## 2011-01-01 ENCOUNTER — Telehealth: Payer: Self-pay | Admitting: Pulmonary Disease

## 2011-01-01 NOTE — Telephone Encounter (Signed)
While her AHI was 0 her RDI was 17.  This is consistent with moderate sleep apnea.  This should qualify her for CPAP.  Can we please confirm this with her DME and insurance.  Will route to [facility] also.

## 2011-01-01 NOTE — Telephone Encounter (Signed)
I spoke with pt and explained to her that According to [facility] her insurance will not cover CPAP and our [facility] is checking on this. Pt voiced her understanding and needed nothing further

## 2011-01-01 NOTE — Telephone Encounter (Signed)
Will forward to Dr. Craige Cotta for review. Spoke with [place] with [facility] and she advised pt will have to come out of pocket due to her not meeting requirements for insurance to cover. Dr. Craige Cotta, please advise, thanks.

## 2011-01-09 ENCOUNTER — Telehealth: Payer: Self-pay | Admitting: Pulmonary Disease

## 2011-01-09 NOTE — Telephone Encounter (Signed)
Spoke to pt, she is waiting on [facility] to reprocess the cpap to see if ins is going to pay then she will let them set her up

## 2011-01-09 NOTE — Telephone Encounter (Signed)
Pt still waiting to hear back from [place] regarding getting set up with cpap.  Looks like her ins will not approve this as her AHI was 0 but RDI was 17 which diagnosis her with moderate OSA. [facility], please advise and update pt on where we are on the status of getting her a cpap machine approved through her ins.  Thanks.

## 2011-02-19 ENCOUNTER — Telehealth: Payer: Self-pay | Admitting: Pulmonary Disease

## 2011-02-19 NOTE — Telephone Encounter (Signed)
I spoke with pt and she states she is wanting to switch from VS to either Dr. Maple Hudson or Dr. Shelle Iron. She states VS schedule is not convenient for her since she works full time. Pt is aware of our protocol to switch physcians. Please advise Dr. Craige Cotta if you are okay with the switch, thanks

## 2011-02-20 NOTE — Telephone Encounter (Signed)
LMOMTCB X1 to schedule consult apt w/ [facility]

## 2011-02-20 NOTE — Telephone Encounter (Signed)
That is fine with me.  Please confirm with either Dr. Maple Hudson or Dr. Shelle Iron.

## 2011-02-20 NOTE — Telephone Encounter (Signed)
Set appt w/ [facility] as previously requested on 2/18 @ 9:30 am.  Pt stated nothing further needed at this time.   Frances Ho

## 2011-02-20 NOTE — Telephone Encounter (Signed)
Please advise Dr. Shelle Iron if you are willing to accept this pt, thanks

## 2011-02-20 NOTE — Telephone Encounter (Signed)
Ok with me 

## 2011-03-05 ENCOUNTER — Telehealth: Payer: Self-pay | Admitting: Pulmonary Disease

## 2011-03-05 NOTE — Telephone Encounter (Signed)
lmomtcb x1 

## 2011-03-06 ENCOUNTER — Institutional Professional Consult (permissible substitution): Payer: 59 | Admitting: Pulmonary Disease

## 2011-03-06 NOTE — Telephone Encounter (Signed)
LMTCBx2. Jennifer Castillo, CMA  

## 2011-03-07 NOTE — Telephone Encounter (Signed)
I spoke with pt and she states she has been blowing blood out of her nose. She states her DME company advised her that the cpap could cause this and she needed to adjust her humidifier on her cpap. Pt states she has been doing this and it has helped. Pt states she is going to keep Playing with the humidifier and see how she does. I advised pt to call back if she had any other problems

## 2011-03-17 ENCOUNTER — Ambulatory Visit (INDEPENDENT_AMBULATORY_CARE_PROVIDER_SITE_OTHER): Payer: 59 | Admitting: Pulmonary Disease

## 2011-03-17 ENCOUNTER — Encounter: Payer: Self-pay | Admitting: Pulmonary Disease

## 2011-03-17 DIAGNOSIS — G4710 Hypersomnia, unspecified: Secondary | ICD-10-CM

## 2011-03-17 DIAGNOSIS — G4733 Obstructive sleep apnea (adult) (pediatric): Secondary | ICD-10-CM

## 2011-03-17 NOTE — Progress Notes (Signed)
Subjective:    Patient ID: Frances Ho, female    DOB: 06/18/1970, 41 y.o.   MRN: [id number]  HPI The patient is a 41 year old female who comes in today as a second opinion for daytime fatigue and sleepiness.  The patient had a sleep study in September of last year, with no apneas or hypopneas, but did show respiratory effort-related arousals with an RDI of 17 per hour.  The patient has been started on CPAP which she has worn compliantly, and feels that it does help her get to sleep quicker at night. Her CPAP is currently set on on auto, and she uses nasal pillows.   However, she still feels unrested in the mornings upon arising, and has "a hard time waking up".  The patient notes significant fatigue and tiredness during the day, but also has a component of sleepiness.  She does not fall asleep during the day at work.  She has no issues watching television in the early evenings, but will get sleepy in the later evening.  She denies any sleepiness with driving.  Her Epworth score today is 9.  The patient denies any insomnia issues, or symptoms that are consistent with RLS.  She denies any chronic pain issues that can bother her sleep, but does have ongoing back problems.  She denies any environmental issues that may disrupt her sleep.  The patient states that she has had this fatigue and sleepiness issue for his long as she can remember, although it is primarily manifested as difficulty becoming alert after arising.  The patient does have a history of head trauma in 2011 where she hit her head on a sink, but a CT of her head in February of 2012 was unremarkable according to the patient.  The patient also states she has had blood work last year with no evidence for thyroid disease or anemia.  Sleep Questionnaire: What time do you typically go to bed?( Between what hours) 12 to 1 am How long does it take you to fall asleep? usually within 30 mins How many times during the night do you wake up? 3 What time do  you get out of bed to start your day? 0900 Do you drive or operate heavy machinery in your occupation? No How much has your weight changed (up or down) over the past two years? (In pounds) 15 lb (6.804 kg) Have you ever had a sleep study before? Yes If yes, location of study? [facility] If yes, date of study? 09/29/2010 Do you currently use CPAP? Yes If so, what pressure? Do you wear oxygen at any time? No     Review of Systems  Constitutional: Positive for unexpected weight change. Negative for fever.  HENT: Positive for nosebleeds. Negative for ear pain, congestion, sore throat, rhinorrhea, sneezing, trouble swallowing, dental problem, postnasal drip and sinus pressure.   Eyes: Negative for redness and itching.  Respiratory: Negative for cough, chest tightness, shortness of breath and wheezing.   Cardiovascular: Negative for palpitations and leg swelling.  Gastrointestinal: Negative for nausea and vomiting.  Genitourinary: Negative for dysuria.  Musculoskeletal: Positive for joint swelling.  Skin: Negative for rash.  Neurological: Positive for headaches.  Hematological: Does not bruise/bleed easily.  Psychiatric/Behavioral: Positive for dysphoric mood. The patient is nervous/anxious.        Objective:   Physical Exam Constitutional:  Well developed, no acute distress  HENT:  Nares patent without discharge  Oropharynx without exudate, palate and uvula are mildly elongated.   Eyes:  Perrla, eomi, no scleral icterus  Neck:  No JVD, no TMG  Cardiovascular:  Normal rate, regular rhythm, no rubs or gallops. 1/6 sem        Intact distal pulses  Pulmonary :  Normal breath sounds, no stridor or respiratory distress   No rales, rhonchi, or wheezing  Abdominal:  Soft, nondistended, bowel sounds present.  No tenderness noted.   Musculoskeletal:  No lower extremity edema noted.  Lymph Nodes:  No cervical lymphadenopathy noted  Skin:  No cyanosis noted  Neurologic:  Alert, appropriate, moves  all 4 extremities without obvious deficit.         Assessment & Plan:

## 2011-03-17 NOTE — Assessment & Plan Note (Signed)
The patient is having issues during the day with fatigue and tiredness, but she also reports sleepiness.  Her primary issue is severe sleepiness in the mornings when awakening, and this is leading to issues getting to work on time.  She appears to be getting enough quantity of sleep, has no insomnia issues, and denies any environmental issues that may disrupt her sleep.  She is wearing CPAP compliantly on an auto setting, and reports no issues with tolerance or mask leaks.  I will get a download off her machine for verification.  Based on her history today, I find it very unlikely that sleep apnea is the cause for her symptoms.  This may have nothing to do with any sleep disorder, such as depression.  However, since she has had similar symptoms for most of her life, I think we need to at least consider narcolepsy or idiopathic hypersomnia.  The patient also had some type of closed head injury in 2011, and that raises the question of posttraumatic hypersomnia.  We'll schedule the patient for an MS LT, and see her back to review the results.

## 2011-03-17 NOTE — Patient Instructions (Signed)
Will get download off your current cpap machine, and let you know if there are any issues Please fill out sleep logs for next 2 weeks, and bring to your daytime sleep testing. Will set up for daytime nap study to evaluate for daytime sleep disorders.

## 2011-03-17 NOTE — Assessment & Plan Note (Signed)
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  She is wearing CPAP compliantly, and has not seen a big change in her symptoms.  This would make it fairly likely that her degree of sleep disordered breathing is having nothing to do with her daytime symptoms.  For now, I advanced the patient to continue on CPAP, and I will get a download off her current machine.

## 2011-03-18 ENCOUNTER — Telehealth: Payer: Self-pay | Admitting: Pulmonary Disease

## 2011-03-18 ENCOUNTER — Encounter: Payer: Self-pay | Admitting: *Deleted

## 2011-03-18 NOTE — Telephone Encounter (Signed)
ok'd per [facility] to give pt dr's note stating she had an appt yesterday with him.    Printed note and mailed to pt's home address. LMOM informing pt of this.

## 2011-03-20 ENCOUNTER — Telehealth: Payer: Self-pay | Admitting: Pulmonary Disease

## 2011-03-20 NOTE — Telephone Encounter (Signed)
Called and spoke with pt.  Pt states she is really hesitant to do the MSLT.  Pt states she sleeps 8-10hrs every weekend and will take multiple naps throughout the day lasting 2-3 hours therefore  Pt wanted to know if KC "would treat pt first for narcolepsy" instead of having the study first.  Pt states she is really worried about cost of the study and also states she only has 1 vacation day left at work until June and she is very concerned about being in an "uncomfortable place" and states this "stresses her out."  .  Pt states she has done "extensive research" on narcolepsy and treatment medications and a common side effect of the medications is headaches.  Pt states she would be willing to try alternative treatment to narcolepsy first ie: herbals, etc before trying a medication.  Pt states if this doesn't work, she would then be willing to have the MSLT.  KC, please advise.  Thanks!

## 2011-03-21 NOTE — Telephone Encounter (Signed)
Pt returned triage's call.  Frances Ho ° °

## 2011-03-21 NOTE — Telephone Encounter (Signed)
I know of no herbal medication that helps with alertness, although I am sure there are those advertised out there.   She doesn';t need to do sleep diaries unless she is going to have the mslt.

## 2011-03-21 NOTE — Telephone Encounter (Signed)
LMOMTCB

## 2011-03-21 NOTE — Telephone Encounter (Signed)
I called and spoke with pt. I informed pt of KC's thoughts.  Pt states she is not wanting a medication but would rather try a herbal if [facility] knows of anything.  Also, would like to know if she needs to do the sleep log only if she is going to proceed with the MSLT or even if she does not proceed with it.  Dr. Shelle Iron, pls advise.  Thanks!

## 2011-03-21 NOTE — Telephone Encounter (Signed)
I do not even know if she has narcolepsy or not.  The MSLT needs to be done if she wishes to evaluate her symptoms.  Would not expose her to any medication without knowing what we are treating.

## 2011-03-21 NOTE — Telephone Encounter (Signed)
I called, spoke with pt.  I informed her of KC's response.  She verbalized understanding and will call back if she decides to have the MSLT done.  Nothing further needed at this time.

## 2011-03-27 ENCOUNTER — Telehealth: Payer: Self-pay | Admitting: Pulmonary Disease

## 2011-03-27 NOTE — Telephone Encounter (Signed)
Called and spoke with pt. Informed her i have remailed letter to her home address, which i verified was correct.    Also, pt wanted to know if [facility] would be willing to do a cpap titration study as well as the MSLT (all done the same day/night).  Pt states she wonders if her cpap is adequately treating her OSA and if that is why she is having daytime sleepiness.  KC, please advise.  Thanks!    Another # to reach pt at is [phone]

## 2011-03-28 NOTE — Telephone Encounter (Signed)
Her current cpap machine is set on auto, therefore it gives her whatever pressure she needs during the night.  Studies have shown that auto devices are just as good as a titration study at predicting therapeutic pressures. Therefore, would like to do MSLT only when she is ready .

## 2011-03-28 NOTE — Telephone Encounter (Signed)
LMOM for pt TCB 

## 2011-03-31 ENCOUNTER — Encounter (HOSPITAL_BASED_OUTPATIENT_CLINIC_OR_DEPARTMENT_OTHER): Payer: 59

## 2011-03-31 NOTE — Telephone Encounter (Signed)
Pt aware. Casey Fye, CMA  

## 2011-04-14 ENCOUNTER — Ambulatory Visit: Payer: 59 | Admitting: Pulmonary Disease

## 2011-04-16 ENCOUNTER — Telehealth: Payer: Self-pay | Admitting: Pulmonary Disease

## 2011-04-16 NOTE — Telephone Encounter (Signed)
Megan, please let pt know that I have reviewed her download.  It shows that her sleep apnea is well controlled on the auto setting, and her mask is not leaking to any significant degree. Let her know that she is wearing cpap only 63% of the time 4 or more hours, and this will need to increase.  Have her work on this.  The goal is 6+ hours a night everynight.

## 2011-04-18 NOTE — Telephone Encounter (Signed)
LMOM for pt TCB 

## 2011-04-21 ENCOUNTER — Other Ambulatory Visit: Payer: Self-pay | Admitting: Obstetrics & Gynecology

## 2011-04-21 DIAGNOSIS — Z1231 Encounter for screening mammogram for malignant neoplasm of breast: Secondary | ICD-10-CM

## 2011-04-22 NOTE — Telephone Encounter (Signed)
Pt returned [name] call.  Holly D Pryor ° °

## 2011-04-22 NOTE — Telephone Encounter (Signed)
LMOM for pt TCB 

## 2011-04-22 NOTE — Telephone Encounter (Signed)
Pt returned [name] call & asked to be called back.  Frances Ho

## 2011-04-23 NOTE — Telephone Encounter (Signed)
Called and spoke with pt.  Informed her of download results and KC's response/ recs. Pt verbalized understanding and denied any questions.

## 2011-05-01 ENCOUNTER — Encounter: Payer: Self-pay | Admitting: Pulmonary Disease

## 2011-05-28 ENCOUNTER — Ambulatory Visit: Payer: 59

## 2011-05-28 ENCOUNTER — Ambulatory Visit
Admission: RE | Admit: 2011-05-28 | Discharge: 2011-05-28 | Disposition: A | Discharge status: Home / Self Care | Payer: 59 | Source: Ambulatory Visit | Attending: Obstetrics & Gynecology | Admitting: Obstetrics & Gynecology

## 2011-05-28 DIAGNOSIS — Z1231 Encounter for screening mammogram for malignant neoplasm of breast: Secondary | ICD-10-CM

## 2011-06-13 ENCOUNTER — Ambulatory Visit: Payer: 59

## 2012-02-12 ENCOUNTER — Telehealth: Payer: Self-pay | Admitting: Pulmonary Disease

## 2012-02-12 NOTE — Telephone Encounter (Signed)
Please advise Frances Ho if you have seen download on pt thanks

## 2012-02-13 NOTE — Telephone Encounter (Signed)
Patient calling back about download.  She is upset she did not get a call back yesterday.

## 2012-02-13 NOTE — Telephone Encounter (Signed)
Spoke with Sue Lush with [facility] to request a copy of D/L...states that D/L was faxed Wednesday but they will refax by Monday morning to triage.  Patient aware that fax is in route and we will contact her as soon as they are reviewed. Pt expressed understanding.

## 2012-02-13 NOTE — Telephone Encounter (Signed)
Pt called back again. Wants to know why she can't get this info left on her phone in detail so she doesn't have to keep calling back. Frances Ho

## 2012-02-13 NOTE — Telephone Encounter (Signed)
[email or web address] to apologize to pt about not receiving a call back and let her know KC was out of the office yesterday evening and advised I have sent msg to [facility] and his nurse to see if they have received this yet.  Ashtyn, pls advise if download was received.  Thank you.

## 2012-02-14 ENCOUNTER — Telehealth: Payer: Self-pay | Admitting: Pulmonary Disease

## 2012-02-14 ENCOUNTER — Other Ambulatory Visit: Payer: Self-pay | Admitting: Pulmonary Disease

## 2012-02-14 NOTE — Telephone Encounter (Signed)
Please let pt know that I have seen her download She is wearing cpap compliantly, has no significant mask leaks, and her sleep apnea is completely controlled on her auto setting.

## 2012-02-16 ENCOUNTER — Other Ambulatory Visit: Payer: Self-pay | Admitting: Pulmonary Disease

## 2012-02-16 DIAGNOSIS — G4733 Obstructive sleep apnea (adult) (pediatric): Secondary | ICD-10-CM

## 2012-02-16 NOTE — Telephone Encounter (Signed)
I spoke with pt. She stated she has already spoken with Frances Ho. She needed nothing further

## 2012-02-16 NOTE — Telephone Encounter (Signed)
Patient states that she is having trouble breathing with the machine with the auto setting; she states she is having bloating issues and she feels this is from the machine. Pt states that she has been measuring her stomach on days she feels bloated and she is measuring 1.5in larger on some mornings. Patient is wondering if this could be something coming from her machine and if there is an adjustment that can be made to give her some relief.   Dr Shelle Iron please advise. Thanks.

## 2012-02-16 NOTE — Telephone Encounter (Signed)
Called, spoke with pt.  Explained below per [facility] to her.  She verbalized understanding of this.  States on her last download, she wasn't going quite up to 10 and it can go up to 20.  Therefore, she would like to try to lower the pressure to see if this helps.  She will also try the simethicone drops.  Dr. Shelle Iron, pls advise on how you would like order placed.  Thank you.

## 2012-02-16 NOTE — Telephone Encounter (Signed)
D/L has been reviewed, patient called per [facility]. Message to be closed

## 2012-02-16 NOTE — Telephone Encounter (Signed)
Pt has an appt w/ KC tomorrow, 02/17/12 am & wishes to keep this appt.  Pt states she saw a different sleep doctor in July.  Unsure when she will need to do compliance since she saw the other doctor.  I asked which doctor her Rx for her CPAP was written by, pt states it was written by VS (her previous sleep doc).  Frances Ho

## 2012-02-16 NOTE — Telephone Encounter (Signed)
She has been on the auto setting all of last year and I thought she was doing well???  Did she change masks or something she was doing?  Pt's can air gulp on cpap and get a lot of air in their stomach at times, but don't know why she would do this all of a sudden.  We can turn pressure down and see how she does, but her sleep apnea may not be totally controlled at lower pressure.  The other option is to flip her over to a fixed pressure, but this usually makes things worse.   She can also try simethicone drops, otc, and put in water and drink at bedtime before putting cpap on.  Let me know what she wants to do.

## 2012-02-16 NOTE — Telephone Encounter (Signed)
Order sent to [facility].  

## 2012-02-17 ENCOUNTER — Encounter: Payer: Self-pay | Admitting: Pulmonary Disease

## 2012-02-17 ENCOUNTER — Encounter: Payer: Self-pay | Admitting: *Deleted

## 2012-02-17 ENCOUNTER — Ambulatory Visit (INDEPENDENT_AMBULATORY_CARE_PROVIDER_SITE_OTHER): Payer: 59 | Admitting: Pulmonary Disease

## 2012-02-17 VITALS — BP 100/68 | HR 84 | Temp 98.9°F | Ht 62.0 in | Wt 150.0 lb

## 2012-02-17 DIAGNOSIS — G4733 Obstructive sleep apnea (adult) (pediatric): Secondary | ICD-10-CM

## 2012-02-17 NOTE — Progress Notes (Signed)
  Subjective:    Patient ID: Frances Ho, female    DOB: 02-19-1970, 42 y.o.   MRN: [id number]  HPI The patient comes in today for followup of her mild obstructive sleep apnea, as well as daytime hypersomnia.  The patient has been having issues with her CPAP mask, and also with air gulping.  We have tried to change her pressure, but she has continued to have issues with this.  Her most recent download shows excellent compliance, complete control of her obstructive sleep apnea, and no significant mask leaks.  I have recommended a multiple sleep latency test in the past because of her persistent sleepiness, and she has not wanted to do this.  However, she recently went to [facility] for a second opinion, and did have a MSLT there.  I have a note that simply states the MS LT was normal.   Review of Systems  Constitutional: Negative for fever and unexpected weight change.  HENT: Positive for rhinorrhea ( since starting CPAP), sneezing and sinus pressure. Negative for ear pain, nosebleeds, congestion, sore throat, trouble swallowing, dental problem and postnasal drip.   Eyes: Positive for visual disturbance ( blurred vision.Marland KitchenMarland KitchenMarland Kitchen1 week. patient states that she had accident 09/2009; thinks it could be from that  but it has worsened with CPAP). Negative for redness and itching.  Respiratory: Positive for shortness of breath ( at times). Negative for cough, chest tightness and wheezing.   Cardiovascular: Negative for palpitations and leg swelling.  Gastrointestinal: Negative for nausea and vomiting.  Genitourinary: Negative for dysuria.  Musculoskeletal: Negative for joint swelling.  Skin: Negative for rash.  Neurological: Positive for headaches.  Hematological: Does not bruise/bleed easily.  Psychiatric/Behavioral: Positive for dysphoric mood. The patient is nervous/anxious.        Objective:   Physical Exam Overweight female in no acute distress Nose with purulent discharge noted No skin  breakdown or pressure necrosis from the CPAP mask Neck without lymphadenopathy or thyromegaly Lower extremities without significant edema, no cyanosis Alert, does not appear to be sleepy, moves all 4 extremities.       Assessment & Plan:

## 2012-02-17 NOTE — Patient Instructions (Addendum)
Will set your cpap machine on a fixed pressure of 8cm to see if tolerance better. Will get you sleep studies from [facility] to review. Think about trying full face mask again. Will call you once I receive your sleep studies.

## 2012-02-18 ENCOUNTER — Encounter: Payer: Self-pay | Admitting: Pulmonary Disease

## 2012-02-18 NOTE — Assessment & Plan Note (Signed)
The patient is wearing CPAP compliantly by her recent download, and his not having breakthrough event.  Despite this, she continues to have daytime sleepiness, and is having air gulping as well as mask issues.  I would like to turn down her pressure a little bit to see if this improves, and I've also asked her to consider trying a fullface mask.  I will try and get her MS LT report from [facility] for review.

## 2012-02-20 ENCOUNTER — Telehealth: Payer: Self-pay | Admitting: Pulmonary Disease

## 2012-02-20 DIAGNOSIS — G4733 Obstructive sleep apnea (adult) (pediatric): Secondary | ICD-10-CM

## 2012-02-20 NOTE — Telephone Encounter (Signed)
Rhonda aware I am sending this in now to [facility] box I called and spoke with pt and made her aware She states that she will just take her machine to [facility] on Monday 1/27 She does not want them to come to her

## 2012-02-20 NOTE — Telephone Encounter (Signed)
Patient calling back about cpap settings again.

## 2012-02-20 NOTE — Telephone Encounter (Signed)
Let her know that we will change her machine setting back to auto at 5-10cm, and try full face mask to see if helps with air gulping.  Let us know how you do with this Please send an order to [facility] to see if this can be taken care of this weekend.

## 2012-02-20 NOTE — Telephone Encounter (Signed)
Pt called back again. "very anxious to have this taken care of asap- can't wait until the days end". Frances Ho

## 2012-02-20 NOTE — Telephone Encounter (Signed)
Spoke with patient, patient states she had the pressure settings changed on her CPAP yesterday (02/19/12) to 8 and she is unable to tolerate this. States she wore the mask through most of the night but ended up having to take it off.  Patient is requesting a change is pressure today because she will not be able to wear her CPAP at the current setting through the weekend.  Dr. Shelle Iron please advise, thank you.

## 2012-03-23 ENCOUNTER — Telehealth: Payer: Self-pay | Admitting: Pulmonary Disease

## 2012-03-23 NOTE — Telephone Encounter (Signed)
Received 18 pages from [facility], sent to Dr. Shelle Iron. 03/23/12/sd

## 2012-03-26 ENCOUNTER — Telehealth: Payer: Self-pay | Admitting: *Deleted

## 2012-03-26 NOTE — Telephone Encounter (Signed)
Records requested have been received and placed in your [employer] folder for review.

## 2012-03-30 ENCOUNTER — Telehealth: Payer: Self-pay | Admitting: Pulmonary Disease

## 2012-03-30 ENCOUNTER — Encounter: Payer: Self-pay | Admitting: Pulmonary Disease

## 2012-03-30 NOTE — Telephone Encounter (Signed)
Patient aware of results and recs per [facility]. Pt will call if anything further needed.

## 2012-03-30 NOTE — Telephone Encounter (Signed)
Patient returning call.

## 2012-03-30 NOTE — Telephone Encounter (Signed)
Please let pt know that I have reviewed her sleep tests from [facility].  The nighttime study showed good control of her OSA with the auto device.  Her daytime nap study showed no significant sleepiness and no evidence for narcolepsy. I would like for her to see how things go on lower pressure and after getting mask that fits well. She needs to keep working on good sleep hygiene, as well as weight loss.

## 2012-03-30 NOTE — Telephone Encounter (Signed)
LMOM x 1 

## 2012-03-31 ENCOUNTER — Telehealth: Payer: Self-pay | Admitting: Pulmonary Disease

## 2012-03-31 NOTE — Telephone Encounter (Signed)
Rec'd from [facility] forward 17 pages to Dr. Shelle Iron

## 2012-04-30 ENCOUNTER — Telehealth: Payer: Self-pay | Admitting: Obstetrics and Gynecology

## 2012-04-30 NOTE — Telephone Encounter (Signed)
Patient advised that ideal Lipid profile would be done fasting for 12 hours or at least after midnight.  Patient requesting to be able to eat a light breakfast and have labs at afternoon appointment.  Lives in [place] and only wants to make one trip but since fainted last year, wants afternoon appointment so she doesn't have to return to work.  Gave patient the option to have labs done at location closer to home or just be aware that if Lipid panel elevated, she could plan to repeat after 12 hour fast.  Patient also frustrated with inability to loose weight and wants lab work done to check this.  Advised will need to discuss this with MD.  Patient to consider options and discuss at [facility].

## 2012-04-30 NOTE — Telephone Encounter (Signed)
Pt is scheduled for Friday May 28, 2012 with Dr. Edward Jolly. Patient wants to have her Cholesterol and TSH checked the same day. How long does she have to fast before those labs? She wants to see if she could eat breakfast early (before 8am) then fast until her appt to do the labs. Patient wants you to leave a message if she does not answer.

## 2012-05-03 ENCOUNTER — Other Ambulatory Visit: Payer: Self-pay

## 2012-05-03 DIAGNOSIS — Z1231 Encounter for screening mammogram for malignant neoplasm of breast: Secondary | ICD-10-CM

## 2012-05-05 NOTE — Telephone Encounter (Signed)
Should be fine to do lipids in afternoon after light breakfast.  Don't eat eggs that day.  Drinks lots of water so doesn't come dehydrated.  Can discuss other lab requests at visit.

## 2012-05-07 NOTE — Telephone Encounter (Signed)
Notified of Dr Rondel Baton instructions regarding labwork.

## 2012-05-21 ENCOUNTER — Ambulatory Visit: Payer: Self-pay | Admitting: Obstetrics and Gynecology

## 2012-05-28 ENCOUNTER — Other Ambulatory Visit: Payer: Self-pay | Admitting: Obstetrics and Gynecology

## 2012-05-28 ENCOUNTER — Encounter: Payer: Self-pay | Admitting: Obstetrics and Gynecology

## 2012-05-28 ENCOUNTER — Ambulatory Visit (INDEPENDENT_AMBULATORY_CARE_PROVIDER_SITE_OTHER): Payer: 59 | Admitting: Obstetrics and Gynecology

## 2012-05-28 VITALS — BP 120/60 | Ht 62.0 in | Wt 149.0 lb

## 2012-05-28 DIAGNOSIS — R198 Other specified symptoms and signs involving the digestive system and abdomen: Secondary | ICD-10-CM

## 2012-05-28 DIAGNOSIS — R5383 Other fatigue: Secondary | ICD-10-CM

## 2012-05-28 DIAGNOSIS — Z0000 Encounter for general adult medical examination without abnormal findings: Secondary | ICD-10-CM

## 2012-05-28 DIAGNOSIS — Z8742 Personal history of other diseases of the female genital tract: Secondary | ICD-10-CM

## 2012-05-28 DIAGNOSIS — R5381 Other malaise: Secondary | ICD-10-CM

## 2012-05-28 DIAGNOSIS — Z01419 Encounter for gynecological examination (general) (routine) without abnormal findings: Secondary | ICD-10-CM

## 2012-05-28 LAB — FOLATE: Folate: >20 ng/mL

## 2012-05-28 LAB — POCT URINALYSIS DIPSTICK
Bilirubin, UA: NEGATIVE
Glucose, UA: NEGATIVE
Nitrite, UA: NEGATIVE
Protein, UA: NEGATIVE
pH, UA: 5

## 2012-05-28 LAB — COMPREHENSIVE METABOLIC PANEL
Albumin: 4.7 g/dL (ref 3.5–5.2)
Alkaline Phosphatase: 57 U/L (ref 39–117)
Creat: 0.91 mg/dL (ref 0.50–1.10)
Sodium: 136 mEq/L (ref 135–145)
Total Protein: 7.9 g/dL (ref 6.0–8.3)

## 2012-05-28 LAB — CBC
Hemoglobin: 14.4 g/dL (ref 12.0–15.0)
RBC: 4.76 MIL/uL (ref 3.87–5.11)
WBC: 5 10*3/uL (ref 4.0–10.5)

## 2012-05-28 LAB — LIPID PANEL
HDL: 65 mg/dL (ref >39)
LDL Cholesterol: 117 mg/dL — ABNORMAL HIGH (ref 0–99)
Triglycerides: 163 mg/dL — ABNORMAL HIGH (ref <150)

## 2012-05-28 LAB — VITAMIN B12: Vitamin B-12: 854 pg/mL (ref 211–911)

## 2012-05-28 MED ORDER — DROSPIRENONE-ETHINYL ESTRADIOL 3-0.02 MG PO TABS: 1.0000 | ORAL_TABLET | Freq: Every day | ORAL | Status: DC

## 2012-05-28 NOTE — Patient Instructions (Signed)

## 2012-05-28 NOTE — Progress Notes (Signed)
Patient ID: Frances Ho, female   DOB: 1970-12-30, 42 y.o.   MRN: [id number] 42 y.o.  Single  Caucasian female   G0P0 here for annual exam.   Patient fasted today and had a late afternoon appointment.  History of syncope with blood draws.    New onset headaches. Notes weight gain.  20 pounds over two years.  Patient attributed it to Minoxidil and stopped this.  Weight Watchers worked but weight loss was slow so became discouraged.  Taking probiotics.  Lost a couple of pounds with this.   Feels pain after has bowel movements, frequently.  No blood in stool.  Has not seen GI.   Patient has had a consultation with [facility] regarding extensive endometriosis.   OCPS working well but wonders if she should be doing more for her endometriosis.  Patient's last menstrual period was 05/16/2012.          Sexually active: no  The current method of family planning is OCP (estrogen/progesterone).    Exercising: No Last mammogram:  05-28-11 wnl: [facility].  Going at the end of this month.   Last pap smear: 01-24-11 wnl:no HPV done History of abnormal pap:  Yes in 2009--LSIL:Colposcopy normal, paps reverted back to normal. Smoking: no Alcohol:no Last colonoscopy:never Last Bone Density: never  Last tetanus shot:03/2008 Last cholesterol check: 12/2011  Hgb:                Urine:Neg    Health Maintenance  Topic Date Due  . Pap Smear  01/24/2012  . Mammogram  05/27/2012  . Influenza Vaccine  09/27/2012  . Tetanus/tdap  03/31/2018    Family History  Problem Relation Age of Onset  . Diabetes      grandfather  . Hypertension      grandfather/grandmother  . Heart disease Maternal Grandmother   . Heart disease Maternal Grandfather   . Heart disease Paternal Grandmother   . Heart disease Paternal Grandfather     Patient Active Problem List   Diagnosis Date Noted  . Hypersomnia 03/17/2011  . OSA (obstructive sleep apnea) 07/30/2010  . Dyspnea 07/03/2010  . Allergic rhinitis, seasonal  07/03/2010  . 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 HEADACHE 10/26/2009  . CONTUSION, FACE 10/26/2009    Past Medical History  Diagnosis Date  . Chronic headaches   . Tachycardia   . Mitral valve prolapse   . OSA (obstructive sleep apnea) 07/30/2010  . Anxiety   . Depression   . Endometriosis   . Genital warts   . Heart murmur     Past Surgical History  Procedure Laterality Date  . Foot surgery  2005    left  . Ovarian cyst removed    . Pelvic laparoscopy Bilateral 11/13/03    laparoscopic removal bilateral endometriomas  . Colposcopy  3/09    w/ECC-neg    Allergies: Review of patient's allergies indicates no known allergies.  Current Outpatient Prescriptions  Medication Sig Dispense Refill  . Ascorbic Acid (VITAMIN C) 1000 MG tablet Take 1,000 mg by mouth daily.      . Biotin (BIOTIN 5000) 5 MG CAPS Take 1 capsule by mouth daily.      . Calcium Carbonate-Vitamin D (CALCIUM 600+D) 600-400 MG-UNIT per tablet Take 1 tablet by mouth daily.       . drospirenone-ethinyl estradiol (YAZ) 3-0.02 MG per tablet Take 1 tablet by mouth daily.       . Flaxseed, Linseed, (FLAXSEED OIL) 1000 MG CAPS Take 1 capsule  by mouth daily.        . folic acid (FOLVITE) 800 MCG tablet Take 800 mcg by mouth daily.        . Glucosamine 500 MG CAPS Take 750 mg by mouth 2 (two) times daily.       . Methylsulfonylmethane (MSM) 1000 MG CAPS Take 1 capsule by mouth daily.        . Multiple Vitamin (MULTIVITAMIN) tablet Take 1 tablet by mouth daily.       . Multiple Vitamins-Minerals (HAIR/SKIN/NAILS) TABS Take 2 tablets by mouth daily.        . vitamin E 400 UNIT capsule Take 400 Units by mouth daily.        Marland Kitchen LORazepam (ATIVAN) 0.5 MG tablet as needed.         No current facility-administered medications for this visit.    ROS: Pertinent items are noted in HPI.  Social Hx:  Does clerical work.  Exam:    BP 120/60  Ht 5\' 2"  (1.575 m)  Wt 149 lb (67.586 kg)  BMI 27.25 kg/m2  LMP 05/16/2012    Wt Readings from Last 3 Encounters:  05/28/12 149 lb (67.586 kg)  02/17/12 150 lb (68.04 kg)  03/17/11 151 lb 9.6 oz (68.765 kg)     Ht Readings from Last 3 Encounters:  05/28/12 5\' 2"  (1.575 m)  02/17/12 5\' 2"  (1.575 m)  03/17/11 5\' 2"  (1.575 m)    General appearance: alert, cooperative and appears stated age Head: Normocephalic, without obvious abnormality, atraumatic Neck: no adenopathy, supple, symmetrical, trachea midline and thyroid not enlarged, symmetric, no tenderness/mass/nodules Lungs: clear to auscultation bilaterally Breasts: Inspection negative, No nipple retraction or dimpling, No nipple discharge or bleeding, No axillary or supraclavicular adenopathy, Normal to palpation without dominant masses Heart: regular rate and rhythm Abdomen: soft, non-tender; no masses,  no organomegaly Extremities: extremities normal, atraumatic, no cyanosis or edema Skin: Skin color, texture, turgor normal. No rashes or lesions Lymph nodes: Cervical, supraclavicular, and axillary nodes normal. No abnormal inguinal nodes palpated Neurologic: Grossly normal   Pelvic: External genitalia:  no lesions              Urethra:  normal appearing urethra with no masses, tenderness or lesions              Bartholins and Skenes: normal                 Vagina: normal appearing vagina with normal color and discharge, no lesions              Cervix: normal appearance              Pap taken: yes and high risk HPV requested        Bimanual Exam:  Uterus:  uterus is normal size, shape, consistency and nontender                                      Adnexa: normal adnexa in size, nontender and no masses                                      Rectovaginal: Confirms  Anus:  normal sphincter tone, no lesions  A: normal gyn exam Fatigue. Tenesmus. History of endometriosis.      P: mammogram pap smear and high risk HPV Will refer to GI Thyroid studies, lipid profile,  CMP, folate, B12,CBC. Continue OCPs Reassess after finishes GI evaluation. return annually or prn     An After Visit Summary was printed and given to the patient.

## 2012-05-31 ENCOUNTER — Telehealth: Payer: Self-pay | Admitting: Obstetrics and Gynecology

## 2012-05-31 NOTE — Telephone Encounter (Signed)
Pt returning call to Amanda.

## 2012-05-31 NOTE — Telephone Encounter (Signed)
LMTCB  aa 

## 2012-05-31 NOTE — Telephone Encounter (Signed)
Patient has questions re: std testing. Patient was in 05/28/12 and wants to know if her blood was held?

## 2012-06-01 NOTE — Telephone Encounter (Signed)
Spoke with pt who is interested in HIV testing from the extra tube of blood that was taken at her last visit. Pt thinks it has been at least a couple of years since she had testing and she is concerned because "HIV can take awhile to show up". Pt not interested in other STD testing at this time. OK to leave message on voicemail if test is to be done.

## 2012-06-02 LAB — HIV ANTIBODY (ROUTINE TESTING W REFLEX): HIV: NONREACTIVE

## 2012-06-02 LAB — IPS PAP TEST WITH HPV

## 2012-06-02 NOTE — Telephone Encounter (Signed)
Patient would like to add labs to her blood work done earlier this week

## 2012-06-02 NOTE — Telephone Encounter (Signed)
Please let patient know that an HIV test will be added to her blood work.  [employer]

## 2012-06-03 NOTE — Telephone Encounter (Signed)
LM on pt's VM that the additional labwork we discussed was added, and we would be in touch with the results.  aa

## 2012-06-25 ENCOUNTER — Ambulatory Visit
Admission: RE | Admit: 2012-06-25 | Discharge: 2012-06-25 | Disposition: A | Discharge status: Home / Self Care | Payer: 59 | Source: Ambulatory Visit

## 2012-06-25 ENCOUNTER — Ambulatory Visit: Payer: 59

## 2012-06-25 DIAGNOSIS — Z1231 Encounter for screening mammogram for malignant neoplasm of breast: Secondary | ICD-10-CM

## 2012-11-23 ENCOUNTER — Ambulatory Visit (INDEPENDENT_AMBULATORY_CARE_PROVIDER_SITE_OTHER): Payer: 59

## 2012-11-23 ENCOUNTER — Telehealth: Payer: Self-pay | Admitting: *Deleted

## 2012-11-23 ENCOUNTER — Encounter: Payer: Self-pay | Admitting: Podiatry

## 2012-11-23 ENCOUNTER — Ambulatory Visit (INDEPENDENT_AMBULATORY_CARE_PROVIDER_SITE_OTHER): Payer: 59 | Admitting: Podiatry

## 2012-11-23 VITALS — BP 109/72 | HR 91 | Resp 16 | Ht 62.0 in | Wt 150.0 lb

## 2012-11-23 DIAGNOSIS — M79609 Pain in unspecified limb: Secondary | ICD-10-CM

## 2012-11-23 DIAGNOSIS — M79671 Pain in right foot: Secondary | ICD-10-CM

## 2012-11-23 DIAGNOSIS — M722 Plantar fascial fibromatosis: Secondary | ICD-10-CM

## 2012-11-23 NOTE — Patient Instructions (Signed)
Plantar Fasciitis Plantar fasciitis is a common condition that causes foot pain. It is soreness (inflammation) of the band of tough fibrous tissue on the bottom of the foot that runs from the heel bone (calcaneus) to the ball of the foot. The cause of this soreness may be from excessive standing, poor fitting shoes, running on hard surfaces, being overweight, having an abnormal walk, or overuse (this is common in runners) of the painful foot or feet. It is also common in aerobic exercise dancers and ballet dancers. SYMPTOMS  Most people with plantar fasciitis complain of:  Severe pain in the morning on the bottom of their foot especially when taking the first steps out of bed. This pain recedes after a few minutes of walking.  Severe pain is experienced also during walking following a long period of inactivity.  Pain is worse when walking barefoot or up stairs DIAGNOSIS   Your caregiver will diagnose this condition by examining and feeling your foot.  Special tests such as X-rays of your foot, are usually not needed. PREVENTION   Consult a sports medicine professional before beginning a new exercise program.  Walking programs offer a good workout. With walking there is a lower chance of overuse injuries common to runners. There is less impact and less jarring of the joints.  Begin all new exercise programs slowly. If problems or pain develop, decrease the amount of time or distance until you are at a comfortable level.  Wear good shoes and replace them regularly.       Plantar Fasciitis (Heel Spur Syndrome) with Rehab The plantar fascia is a fibrous, ligament-like, soft-tissue structure that spans the bottom of the foot. Plantar fasciitis is a condition that causes pain in the foot due to inflammation of the tissue. SYMPTOMS   Pain and tenderness on the underneath side of the foot.  Pain that worsens with standing or walking. CAUSES  Plantar fasciitis is caused by irritation  and injury to the plantar fascia on the underneath side of the foot. Common mechanisms of injury include:  Direct trauma to bottom of the foot.  Damage to a small nerve that runs under the foot where the main fascia attaches to the heel bone.  Stress placed on the plantar fascia due to bone spurs. RISK INCREASES WITH:   Activities that place stress on the plantar fascia (running, jumping, pivoting, or cutting).  Poor strength and flexibility.  Improperly fitted shoes.  Tight calf muscles.  Flat feet.  Failure to warm-up properly before activity.  Obesity. PREVENTION  Warm up and stretch properly before activity.  Allow for adequate recovery between workouts.  Maintain physical fitness:  Strength, flexibility, and endurance.  Cardiovascular fitness.  Maintain a health body weight.  Avoid stress on the plantar fascia.  Wear properly fitted shoes, including arch supports for individuals who have flat feet. PROGNOSIS  If treated properly, then the symptoms of plantar fasciitis usually resolve without surgery. However, occasionally surgery is necessary. RELATED COMPLICATIONS   Recurrent symptoms that may result in a chronic condition.  Problems of the lower back that are caused by compensating for the injury, such as limping.  Pain or weakness of the foot during push-off following surgery.  Chronic inflammation, scarring, and partial or complete fascia tear, occurring more often from repeated injections. TREATMENT  Treatment initially involves the use of ice and medication to help reduce pain and inflammation. The use of strengthening and stretching exercises may help reduce pain with activity, especially stretches of the  Achilles tendon. These exercises may be performed at home or with a therapist. Your caregiver may recommend that you use heel cups of arch supports to help reduce stress on the plantar fascia. Occasionally, corticosteroid injections are given to reduce  inflammation. If symptoms persist for greater than 6 months despite non-surgical (conservative), then surgery may be recommended.  MEDICATION   If pain medication is necessary, then nonsteroidal anti-inflammatory medications, such as aspirin and ibuprofen, or other minor pain relievers, such as acetaminophen, are often recommended.  Do not take pain medication within 7 days before surgery.  Prescription pain relievers may be given if deemed necessary by your caregiver. Use only as directed and only as much as you need.  Corticosteroid injections may be given by your caregiver. These injections should be reserved for the most serious cases, because they may only be given a certain number of times. HEAT AND COLD  Cold treatment (icing) relieves pain and reduces inflammation. Cold treatment should be applied for 10 to 15 minutes every 2 to 3 hours for inflammation and pain and immediately after any activity that aggravates your symptoms. Use ice packs or massage the area with a piece of ice (ice massage).  Heat treatment may be used prior to performing the stretching and strengthening activities prescribed by your caregiver, physical therapist, or athletic trainer. Use a heat pack or soak the injury in warm water. SEEK IMMEDIATE MEDICAL CARE IF:  Treatment seems to offer no benefit, or the condition worsens.  Any medications produce adverse side effects. EXERCISES RANGE OF MOTION (ROM) AND STRETCHING EXERCISES - Plantar Fasciitis (Heel Spur Syndrome) These exercises may help you when beginning to rehabilitate your injury. Your symptoms may resolve with or without further involvement from your physician, physical therapist or athletic trainer. While completing these exercises, remember:   Restoring tissue flexibility helps normal motion to return to the joints. This allows healthier, less painful movement and activity.  An effective stretch should be held for at least 30 seconds.  A stretch  should never be painful. You should only feel a gentle lengthening or release in the stretched tissue. RANGE OF MOTION - Toe Extension, Flexion  Sit with your right / left leg crossed over your opposite knee.  Grasp your toes and gently pull them back toward the top of your foot. You should feel a stretch on the bottom of your toes and/or foot.  Hold this stretch for __________ seconds.  Now, gently pull your toes toward the bottom of your foot. You should feel a stretch on the top of your toes and or foot.  Hold this stretch for __________ seconds. Repeat __________ times. Complete this stretch __________ times per day.  RANGE OF MOTION - Ankle Dorsiflexion, Active Assisted  Remove shoes and sit on a chair that is preferably not on a carpeted surface.  Place right / left foot under knee. Extend your opposite leg for support.  Keeping your heel down, slide your right / left foot back toward the chair until you feel a stretch at your ankle or calf. If you do not feel a stretch, slide your bottom forward to the edge of the chair, while still keeping your heel down.  Hold this stretch for __________ seconds. Repeat __________ times. Complete this stretch __________ times per day.  STRETCH  Gastroc, Standing  Place hands on wall.  Extend right / left leg, keeping the front knee somewhat bent.  Slightly point your toes inward on your back foot.  Keeping  your right / left heel on the floor and your knee straight, shift your weight toward the wall, not allowing your back to arch.  You should feel a gentle stretch in the right / left calf. Hold this position for __________ seconds. Repeat __________ times. Complete this stretch __________ times per day. STRETCH  Soleus, Standing  Place hands on wall.  Extend right / left leg, keeping the other knee somewhat bent.  Slightly point your toes inward on your back foot.  Keep your right / left heel on the floor, bend your back knee, and  slightly shift your weight over the back leg so that you feel a gentle stretch deep in your back calf.  Hold this position for __________ seconds. Repeat __________ times. Complete this stretch __________ times per day. STRETCH  Gastrocsoleus, Standing  Note: This exercise can place a lot of stress on your foot and ankle. Please complete this exercise only if specifically instructed by your caregiver.   Place the ball of your right / left foot on a step, keeping your other foot firmly on the same step.  Hold on to the wall or a rail for balance.  Slowly lift your other foot, allowing your body weight to press your heel down over the edge of the step.  You should feel a stretch in your right / left calf.  Hold this position for __________ seconds.  Repeat this exercise with a slight bend in your right / left knee. Repeat __________ times. Complete this stretch __________ times per day.  STRENGTHENING EXERCISES - Plantar Fasciitis (Heel Spur Syndrome)  These exercises may help you when beginning to rehabilitate your injury. They may resolve your symptoms with or without further involvement from your physician, physical therapist or athletic trainer. While completing these exercises, remember:   Muscles can gain both the endurance and the strength needed for everyday activities through controlled exercises.  Complete these exercises as instructed by your physician, physical therapist or athletic trainer. Progress the resistance and repetitions only as guided. STRENGTH - Towel Curls  Sit in a chair positioned on a non-carpeted surface.  Place your foot on a towel, keeping your heel on the floor.  Pull the towel toward your heel by only curling your toes. Keep your heel on the floor.  If instructed by your physician, physical therapist or athletic trainer, add ____________________ at the end of the towel. Repeat __________ times. Complete this exercise __________ times per day. STRENGTH  - Ankle Inversion  Secure one end of a rubber exercise band/tubing to a fixed object (table, pole). Loop the other end around your foot just before your toes.  Place your fists between your knees. This will focus your strengthening at your ankle.  Slowly, pull your big toe up and in, making sure the band/tubing is positioned to resist the entire motion.  Hold this position for __________ seconds.  Have your muscles resist the band/tubing as it slowly pulls your foot back to the starting position. Repeat __________ times. Complete this exercises __________ times per day.  Document Released: 01/13/2005 Document Revised: 04/07/2011 Document Reviewed: 04/27/2008 [facility] Patient Information 2014 [place], [place].   Stretch your foot and the heel cords at the back of the ankle (Achilles tendon) both before and after exercise.  Run or exercise on even surfaces that are not hard. For example, asphalt is better than pavement.  Do not run barefoot on hard surfaces.  If using a treadmill, vary the incline.  Do not continue  to workout if you have foot or joint problems. Seek professional help if they do not improve. HOME CARE INSTRUCTIONS   Avoid activities that cause you pain until you recover.  Use ice or cold packs on the problem or painful areas after working out.  Only take over-the-counter or prescription medicines for pain, discomfort, or fever as directed by your caregiver.  Soft shoe inserts or athletic shoes with air or gel sole cushions may be helpful.  If problems continue or become more severe, consult a sports medicine caregiver or your own health care provider. Cortisone is a potent anti-inflammatory medication that may be injected into the painful area. You can discuss this treatment with your caregiver. MAKE SURE YOU:   Understand these instructions.  Will watch your condition.  Will get help right away if you are not doing well or get worse. Document Released:  10/08/2000 Document Revised: 04/07/2011 Document Reviewed: 12/08/2007 [facility] Patient Information 2014 [place], [place].

## 2012-11-23 NOTE — Progress Notes (Signed)
  Subjective:    Patient ID: Frances Ho, female    DOB: Jun 10, 1970, 42 y.o.   MRN: [id number]  HPI Comments: "My right foot really hurts"  Plantar/Lateral heel right - some lateral ankle right  N - aches L - Plantar/Lateral heel right - some lateral ankle right D - couple mos. O - gradual C - AM pain, worse A - walking T - advil      Review of Systems  Musculoskeletal: Positive for back pain.       Leg pain  All other systems reviewed and are negative.       Objective:   Physical Exam: I have reviewed her past medical history her medications and allergies. Vital signs are stable she is alert oriented x3. Pulses are strongly palpable bilateral lower extremity. Neurologic sensorium is intact per since once the monofilament. Deep tendon reflexes are intact and brisk bilaterally. Muscle strength is 5 over 5 dorsiflexors plantar flexors inverters and evertors. Orthopedic evaluation Mr. is all joints distal to the ankle a full range of motion without crepitation. Moderately tender on palpation. She also has tenderness on the lateral aspect of the foot overlying the fourth fifth met cuboid articulation as well as the posterior tibial tendon medially. Radiographic evaluation does demonstrate soft tissue increase in density at the plantar fascial calcaneal insertion site of the right heel. Otherwise no other osseous abnormalities noted.        Assessment & Plan:  Impression: Plantar fasciitis right with lateral compensatory syndrome.  Plan: We discussed the etiology pathology conservative versus surgical therapies. I injected 20 mg of Kenalog to the point of maximal tenderness today right foot. The plantar fascial strapping was provided. She will use a night splint assured he has a time. We discussed both oral and written home-going instructions for stretching exercises and ice therapy I also wrote her a prescription for Sterapred Dosepak to be followed by Mobic. We discussed  appropriate shoe gear stretching exercises and ice therapy. Followup with her in one month.

## 2012-11-23 NOTE — Telephone Encounter (Signed)
Pt called states she does not want steroids, because they cause her to gain weight.  Returned call - left message stating that one of the medications was a steroid tapering dose pack and that some people felt it caused them to retain fluid, which was temporary.  I informed her she could decline the steroid pack and begin on the anti-inflamatory Mobic/Meloxicam.  I encouraged pt to call with concerns.  I will inform Dr. Al Corpus of pt's concerns'

## 2012-11-23 NOTE — Telephone Encounter (Signed)
Pt called states she has decided to take the steroid dose pack, but has Mobic that didn't work in the cabinet at home.  What else can she use, has Advil 200 mg?  I told pt she should complete the dose pack and if still having problems, can use Advil 200 mg 2 tablets qid for 2 weeks and call again.  Pt agreed.

## 2012-11-24 NOTE — Telephone Encounter (Signed)
Pt states pharmacist said the steroid would bother her sleeping, so she has decided not to take the steroid dose pack.  Pt asked what else could she take.  Return call - informed pt she could take the Advil we had discussed 11/23/2012.

## 2012-12-07 ENCOUNTER — Ambulatory Visit: Payer: 59 | Admitting: Podiatry

## 2012-12-09 ENCOUNTER — Ambulatory Visit: Payer: 59 | Admitting: Podiatry

## 2012-12-10 ENCOUNTER — Other Ambulatory Visit: Payer: Self-pay | Admitting: Obstetrics & Gynecology

## 2012-12-10 NOTE — Telephone Encounter (Signed)
eScribe request for refill on YAZ Last filled - 05/28/12, #1 x 11 Last AEX - 05/28/12 Next AEX - 11/22/12 RX denied.  Already sent on 05/28/12.

## 2012-12-13 ENCOUNTER — Telehealth: Payer: Self-pay | Admitting: Obstetrics and Gynecology

## 2012-12-13 NOTE — Telephone Encounter (Signed)
Pt would like to know what her chlolesterol and glucose reading were at her last visit.

## 2012-12-14 NOTE — Telephone Encounter (Signed)
Patient calling for lab results from last visit. States she has been "feeling different" Complains of headache, sweating blurry vision, weakness and fatigue. These are not new symptoms and are not sudden in onset, not all at one time. Describes this as a generalized problem, not acute and that no one has been able to figure it out. Checked her own blood glucose on a monitor she bought from drug store, non fasting reading was 65. Following day fasting reading did not register, "error code or too low to read" Checking on last results here to see what glucose was. Advised of results including very normal FBS of 87. Advised that we are GYN office and that all of these symptoms warrant evaluation by PCP and if sudden or increasing, change from her normal, recommend ED immediately.  She states she has many medical issues, sleep apnea, managed by sleep physician, advised needs PCP to coordinate care. Given name of DR Darrell Jewel and Dr Duanne Guess, female PCP's to establish care. Patient asking about MY Chart, advised this is for minor questions and refill requests, not a substitute for triage, transferred to front desk for access code. Again, recommend not to minimize any change in these symptoms as these can be serious and recommend ED if sudden onset or change in symptoms.  Routing to provider for final review. Patient agreeable to disposition. Will close encounter

## 2012-12-14 NOTE — Telephone Encounter (Signed)
Message left to return call to [place] at [phone].   Lab Results  Component Value Date   CHOL 215* 05/28/2012   HDL 65 05/28/2012   LDLCALC 865* 05/28/2012   TRIG 163* 05/28/2012   CHOLHDL 3.3 05/28/2012   Glucose, Bld 70 - 99 mg/dL  87

## 2012-12-16 ENCOUNTER — Ambulatory Visit: Payer: 59 | Admitting: Podiatry

## 2012-12-27 ENCOUNTER — Telehealth: Payer: Self-pay | Admitting: *Deleted

## 2012-12-27 NOTE — Telephone Encounter (Addendum)
Pt states Air Fracture Frances Ho is difficult and hot to sleep in. Could she get a night brace? Dr Al Corpus had ordered a Nightsplint, but pt had opted for sleeping in the [employer]  I told pt about the Nightsplint, she asked if it was hot.  I described it.  Pt states lives in [place] and works in [place], doesn't know when she can get it, so will think about it and call again.  12/29/2102 informed pt that she could purchase the nightsplint she preferred, no prescripotion was necessary for durable medical equipment.

## 2012-12-27 NOTE — Telephone Encounter (Signed)
Of course.  I had prescribed it initially.

## 2012-12-28 ENCOUNTER — Telehealth: Payer: Self-pay | Admitting: *Deleted

## 2012-12-28 NOTE — Telephone Encounter (Signed)
Pt states the brace has a form over foot and toes, a strap at the ankle, pulls foot straight up.  I left message that if the brace held the foot at a 90 degree angle it would be fine.

## 2012-12-30 ENCOUNTER — Ambulatory Visit: Payer: 59 | Admitting: Podiatry

## 2013-05-23 ENCOUNTER — Telehealth: Payer: Self-pay | Admitting: Emergency Medicine

## 2013-05-23 NOTE — Telephone Encounter (Signed)
Spoke with patient regarding message from Dr. Hyacinth MeekerMiller on MRI of Lumbar Spine WO contrast from [facility] on 05/01/13. Called patient to speak about Dr. Rondel BatonMiller's message and patient states she no longer needs assistant as report was incorrect "due to a voice recognition error" upon dictation.   Advised patient to let us know if needs anything further. Will send MRI to scan.   Routing to provider for final review. Patient agreeable to disposition. Will close encounter

## 2013-05-31 ENCOUNTER — Other Ambulatory Visit: Payer: Self-pay | Admitting: Obstetrics and Gynecology

## 2013-06-01 NOTE — Telephone Encounter (Signed)
Last AEX and refill 05/28/12 #1/11 refills Next appt 08/09/2013   Will refill once until appt 08/09/2013.

## 2013-06-03 ENCOUNTER — Ambulatory Visit: Payer: 59 | Admitting: Obstetrics and Gynecology

## 2013-07-04 ENCOUNTER — Ambulatory Visit: Payer: 59 | Admitting: Obstetrics & Gynecology

## 2013-08-03 ENCOUNTER — Telehealth: Payer: Self-pay | Admitting: Obstetrics & Gynecology

## 2013-08-03 DIAGNOSIS — Z0000 Encounter for general adult medical examination without abnormal findings: Secondary | ICD-10-CM

## 2013-08-03 NOTE — Telephone Encounter (Signed)
Routing to Dr. Hyacinth MeekerMiller, okay to place fasting labs for patient prior to annual exam?

## 2013-08-03 NOTE — Telephone Encounter (Signed)
Pt wants to come in for  fasting bloodwork on this Friday. She has her AEX scheduled on 08/09/2013 with Dr.MSM. Will she need a order?

## 2013-08-04 NOTE — Telephone Encounter (Signed)
Yes. Agree. Thanks

## 2013-08-04 NOTE — Telephone Encounter (Signed)
Called patient. Advised labs ordered.  Patient given appointment to have labs drawn. Advised fasting. Patient would like to come in at 1130 to have fasting lab drawn as she states she has a hair appointment earlier.  Patient states she sometimes faints as well. Advised may be better to have labs drawn earlier in the day. She states she will change eating scheduled and that she will be fine. Noted in lab notes that patient reports fainting. Advise to let lab technician be aware that she faints as well.

## 2013-08-05 ENCOUNTER — Other Ambulatory Visit: Payer: 59

## 2013-08-05 ENCOUNTER — Telehealth: Payer: Self-pay | Admitting: Obstetrics & Gynecology

## 2013-08-05 NOTE — Telephone Encounter (Signed)
Left message to call Lamanda Rudder at [phone]. 

## 2013-08-05 NOTE — Telephone Encounter (Signed)
Patient calling to speak with nurse about seeing if she can get labs drawn somewhere else over the weekend. She had an appointment today for: Fasting blood draw/TSH, Lipid, Vit D, CMP/faints/tf." She had an appointment today but called and cancelled as she is not feeling well.

## 2013-08-09 ENCOUNTER — Encounter: Payer: Self-pay | Admitting: Obstetrics & Gynecology

## 2013-08-09 ENCOUNTER — Ambulatory Visit (INDEPENDENT_AMBULATORY_CARE_PROVIDER_SITE_OTHER): Payer: 59 | Admitting: Obstetrics & Gynecology

## 2013-08-09 VITALS — BP 118/78 | HR 64 | Resp 16 | Ht 62.0 in | Wt 151.2 lb

## 2013-08-09 DIAGNOSIS — N809 Endometriosis, unspecified: Secondary | ICD-10-CM

## 2013-08-09 DIAGNOSIS — Z124 Encounter for screening for malignant neoplasm of cervix: Secondary | ICD-10-CM

## 2013-08-09 DIAGNOSIS — Z01419 Encounter for gynecological examination (general) (routine) without abnormal findings: Secondary | ICD-10-CM

## 2013-08-09 DIAGNOSIS — R300 Dysuria: Secondary | ICD-10-CM

## 2013-08-09 DIAGNOSIS — Z0000 Encounter for general adult medical examination without abnormal findings: Secondary | ICD-10-CM

## 2013-08-09 LAB — LIPID PANEL
CHOLESTEROL: 217 mg/dL — AB (ref 0–200)
HDL: 66 mg/dL (ref >39)
LDL Cholesterol: 120 mg/dL — ABNORMAL HIGH (ref 0–99)
TRIGLYCERIDES: 154 mg/dL — AB (ref <150)
Total CHOL/HDL Ratio: 3.3 Ratio
VLDL: 31 mg/dL (ref 0–40)

## 2013-08-09 LAB — POCT URINALYSIS DIPSTICK
BILIRUBIN UA: NEGATIVE
Glucose, UA: NEGATIVE
LEUKOCYTES UA: NEGATIVE
NITRITE UA: NEGATIVE
PROTEIN UA: NEGATIVE
RBC UA: NEGATIVE
Urobilinogen, UA: NEGATIVE
pH, UA: 5

## 2013-08-09 LAB — COMPREHENSIVE METABOLIC PANEL
ALT: 15 U/L (ref 0–35)
AST: 24 U/L (ref 0–37)
Albumin: 4.3 g/dL (ref 3.5–5.2)
Alkaline Phosphatase: 55 U/L (ref 39–117)
BILIRUBIN TOTAL: 0.6 mg/dL (ref 0.2–1.2)
BUN: 10 mg/dL (ref 6–23)
CHLORIDE: 101 meq/L (ref 96–112)
CO2: 22 mEq/L (ref 19–32)
CREATININE: 0.91 mg/dL (ref 0.50–1.10)
Calcium: 8.8 mg/dL (ref 8.4–10.5)
GLUCOSE: 87 mg/dL (ref 70–99)
Potassium: 3.8 mEq/L (ref 3.5–5.3)
Sodium: 135 mEq/L (ref 135–145)
Total Protein: 7.2 g/dL (ref 6.0–8.3)

## 2013-08-09 MED ORDER — DROSPIRENONE-ETHINYL ESTRADIOL 3-0.02 MG PO TABS: ORAL_TABLET | ORAL | Status: DC

## 2013-08-09 NOTE — Progress Notes (Signed)
43 y.o. G0P0 SingleCaucasianF here for annual exam.  Working at [employer].  Has been there for four weeks.  Cycles regular.  Flow lasts 2-3 days.  Pain is minimal.  She is really doing well from this standpoint.     Patient's last menstrual period was 08/01/2013.          Sexually active: No.  The current method of family planning is OCP (estrogen/progesterone).    Exercising: No.  not regularly Smoker:  no  Health Maintenance: Pap:  05/31/12 WNL/negative HR HPV History of abnormal Pap:  Yes h/o LGSIL MMG:  06/25/12-normal Colonoscopy:  none BMD:   none TDaP:  2010 Screening Labs: today, Hb today: 13.0, Urine today: KETONES-trace   reports that she has never smoked. She has never used smokeless tobacco. She reports that she does not drink alcohol or use illicit drugs.  Past Medical History  Diagnosis Date  . Chronic headaches   . Tachycardia   . Mitral valve prolapse   . OSA (obstructive sleep apnea) 07/30/2010  . Anxiety   . Depression   . Endometriosis   . Genital warts   . Heart murmur     Past Surgical History  Procedure Laterality Date  . Foot surgery  2005    left  . Ovarian cyst removed    . Pelvic laparoscopy Bilateral 11/13/03    laparoscopic removal bilateral endometriomas  . Colposcopy  3/09    w/ECC-neg    Current Outpatient Prescriptions  Medication Sig Dispense Refill  . Ascorbic Acid (VITAMIN C) 1000 MG tablet Take 1,000 mg by mouth daily.      . Biotin (BIOTIN 5000) 5 MG CAPS Take 1 capsule by mouth daily.      . Calcium Carbonate-Vitamin D (CALCIUM 600+D) 600-400 MG-UNIT per tablet Take 1 tablet by mouth daily.       . Flaxseed, Linseed, (FLAXSEED OIL) 1000 MG CAPS Take 1 capsule by mouth daily.        . folic acid (FOLVITE) 800 MCG tablet Take 800 mcg by mouth daily.        . Glucosamine 500 MG CAPS Take 750 mg by mouth 2 (two) times daily.       Marland Kitchen. loratadine (CLARITIN) 10 MG tablet Take 10 mg by mouth daily.      . Methylsulfonylmethane (MSM) 1000  MG CAPS Take 1 capsule by mouth daily.        . Multiple Vitamin (MULTIVITAMIN) tablet Take 1 tablet by mouth daily.       . Multiple Vitamins-Minerals (HAIR/SKIN/NAILS) TABS Take 2 tablets by mouth daily.        . vitamin E 400 UNIT capsule Take 400 Units by mouth daily.        Marland Kitchen. YAZ 3-0.02 MG tablet TAKE 1 TABLET BY MOUTH DAILY.  28 tablet  2   No current facility-administered medications for this visit.    Family History  Problem Relation Age of Onset  . Diabetes      grandfather  . Hypertension      grandfather/grandmother  . Heart disease Maternal Grandmother   . Heart disease Maternal Grandfather   . Heart disease Paternal Grandmother   . Heart disease Paternal Grandfather   . Skin cancer Mother     ROS:  Pertinent items are noted in HPI.  Otherwise, a comprehensive ROS was negative.  Exam:   BP 118/78  Pulse 64  Resp 16  Ht 5\' 2"  (1.575 m)  Wt 151 lb  3.2 oz (68.584 kg)  BMI 27.65 kg/m2  LMP 08/01/2013  Weight change: +3#  Height: 5\' 2"  (157.5 cm)  Ht Readings from Last 3 Encounters:  08/09/13 5\' 2"  (1.575 m)  11/23/12 5\' 2"  (1.575 m)  05/28/12 5\' 2"  (1.575 m)    General appearance: alert, cooperative and appears stated age Head: Normocephalic, without obvious abnormality, atraumatic Neck: no adenopathy, supple, symmetrical, trachea midline and thyroid normal to inspection and palpation Lungs: clear to auscultation bilaterally Breasts: normal appearance, no masses or tenderness Heart: regular rate and rhythm Abdomen: soft, non-tender; bowel sounds normal; no masses,  no organomegaly Extremities: extremities normal, atraumatic, no cyanosis or edema Skin: Skin color, texture, turgor normal. No rashes or lesions Lymph nodes: Cervical, supraclavicular, and axillary nodes normal. No abnormal inguinal nodes palpated Neurologic: Grossly normal   Pelvic: External genitalia:  no lesions              Urethra:  normal appearing urethra with no masses, tenderness or  lesions              Bartholins and Skenes: normal                 Vagina: normal appearing vagina with normal color and discharge, no lesions              Cervix: no lesions              Pap taken: Yes.   Bimanual Exam:  Uterus:  firmness c/w endometriosis              Adnexa: as above               Rectovaginal: Confirms               Anus:  normal sphincter tone, no lesions  A:  Well Woman with normal exam Endometriosis, Stage IV Depression/Anxiety H/O HR HPV with neg Pap H/O genital warts Dysuria On OCPs  P:   Mammogram yearly or every other year per guidelines pap smear only today CMP, Lipids, TSH, Vit D No STD testing indicated as not SA since last testing was completed Rx for yaz for one year to pharmacy Urine culture pending return annually or prn  An After Visit Summary was printed and given to the patient.

## 2013-08-09 NOTE — Telephone Encounter (Signed)
Patient is scheduled for AEX tooday with dr Hyacinth MeekerMiller, will address labs at that time.  Routing to provider for final review. Patient agreeable to disposition. Will close encounter

## 2013-08-10 LAB — HEMOGLOBIN, FINGERSTICK: Hemoglobin, fingerstick: 13 g/dL (ref 12.0–16.0)

## 2013-08-10 LAB — VITAMIN D 25 HYDROXY (VIT D DEFICIENCY, FRACTURES): VIT D 25 HYDROXY: 58 ng/mL (ref 30–89)

## 2013-08-10 LAB — TSH: TSH: 2.09 u[IU]/mL (ref 0.350–4.500)

## 2013-08-11 ENCOUNTER — Telehealth: Payer: Self-pay

## 2013-08-11 LAB — URINE CULTURE
Colony Count: NO GROWTH
Organism ID, Bacteria: NO GROWTH

## 2013-08-11 LAB — IPS PAP SMEAR ONLY

## 2013-08-11 NOTE — Telephone Encounter (Signed)
Patient notified of all results.//kn 

## 2013-08-11 NOTE — Telephone Encounter (Signed)
Message copied by Elisha HeadlandNIX, Renea Schoonmaker S on Thu Aug 11, 2013  1:39 PM ------      Message from: Jerene BearsMILLER, MARY S      Created: Wed Aug 10, 2013 12:15 PM       Inform CMP nl.  TSH nl.  Vit D nl.  Lipids mildly elevated.  Total 217.  LDL 120 but HDLs good.  TGs mildly elevated.  No treatment needed.  Repeat one year. ------

## 2013-08-11 NOTE — Telephone Encounter (Signed)
Lmtcb//kn 

## 2013-09-01 ENCOUNTER — Telehealth: Payer: Self-pay | Admitting: Cardiology

## 2013-09-01 NOTE — Telephone Encounter (Signed)
Janina MayoURNER,TRACI M 06/27/2012 04:59:43 PM > please let patient konw that heart function is normal with mild MV prolapse and mild leakiness of MV - please have patient follouwp with me in 1 year for OV  Dr Mayford Knifeurner this was your last Remark with pts last echo... Please advise.

## 2013-09-01 NOTE — Telephone Encounter (Signed)
New Prob    Pt is calling to see if she needs to have an ECHO done this year. Please call.

## 2013-09-02 NOTE — Telephone Encounter (Signed)
F/u ° ° °Pt calling about previous message °

## 2013-09-02 NOTE — Telephone Encounter (Signed)
I spoke with pt & she would like a repeat ECHO this year & would like to have it coded diagnostic so it would be covered by her insurance. She is aware Dr. Mayford Knifeurner is out of the office & will return Monday.  I will forward this to Dr. Mayford Knifeurner & Milagros Lollanielle Debbie Tomasa Dobransky RN

## 2013-09-05 NOTE — Telephone Encounter (Signed)
She only had mild MR on last echo a year ago.  She does not need an echo for 2 years from now and insurance will not pay for it any sooner.

## 2013-09-05 NOTE — Telephone Encounter (Signed)
On your cart for your review.Marland Kitchen..Marland Kitchen

## 2013-09-05 NOTE — Telephone Encounter (Signed)
lmtrc

## 2013-09-05 NOTE — Telephone Encounter (Signed)
Please get a copy of the last OV note and echo

## 2013-09-06 ENCOUNTER — Telehealth: Payer: Self-pay | Admitting: Cardiology

## 2013-09-06 NOTE — Telephone Encounter (Signed)
New message ° ° ° ° ° ° ° ° ° °Pt returning nurses call °

## 2013-09-07 NOTE — Telephone Encounter (Signed)
Spoke with pt. Pt set to see Dr Mayford Knifeurner November 3rd at 345

## 2013-09-07 NOTE — Telephone Encounter (Signed)
lmtrc

## 2013-09-07 NOTE — Telephone Encounter (Signed)
Spoke to pt and made aware. Also schedule 1 year f/u

## 2013-09-10 ENCOUNTER — Encounter: Payer: Self-pay | Admitting: *Deleted

## 2013-11-17 ENCOUNTER — Other Ambulatory Visit: Payer: Self-pay

## 2013-11-17 DIAGNOSIS — Z1231 Encounter for screening mammogram for malignant neoplasm of breast: Secondary | ICD-10-CM

## 2013-11-22 ENCOUNTER — Ambulatory Visit: Payer: 59 | Admitting: Obstetrics & Gynecology

## 2013-11-29 ENCOUNTER — Ambulatory Visit: Payer: 59 | Admitting: Cardiology

## 2013-12-20 ENCOUNTER — Ambulatory Visit (INDEPENDENT_AMBULATORY_CARE_PROVIDER_SITE_OTHER): Payer: 59 | Admitting: Cardiology

## 2013-12-20 ENCOUNTER — Encounter: Payer: Self-pay | Admitting: Cardiology

## 2013-12-20 ENCOUNTER — Ambulatory Visit
Admission: RE | Admit: 2013-12-20 | Discharge: 2013-12-20 | Disposition: A | Discharge status: Home / Self Care | Payer: 59 | Source: Ambulatory Visit

## 2013-12-20 VITALS — BP 104/74 | HR 91 | Ht 62.0 in | Wt 152.6 lb

## 2013-12-20 DIAGNOSIS — I340 Nonrheumatic mitral (valve) insufficiency: Secondary | ICD-10-CM

## 2013-12-20 DIAGNOSIS — R0789 Other chest pain: Secondary | ICD-10-CM

## 2013-12-20 DIAGNOSIS — Z1231 Encounter for screening mammogram for malignant neoplasm of breast: Secondary | ICD-10-CM

## 2013-12-20 DIAGNOSIS — R079 Chest pain, unspecified: Secondary | ICD-10-CM | POA: Insufficient documentation

## 2013-12-20 DIAGNOSIS — I341 Nonrheumatic mitral (valve) prolapse: Secondary | ICD-10-CM

## 2013-12-20 NOTE — Progress Notes (Signed)
[address] [place], [place]  [id number] Phone: [phone] Fax:  [phone]  Date:  12/20/2013   ID:  Frances Ho, DOB 12-Nov-1970, MRN [id number]  PCP:  No PCP Per Patient  Cardiologist:  Armanda Magicraci Scott Fix, MD    History of Present Illness: Frances Ho is a 43 y.o. 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  She says that she has had a few sharp pains recently that only last a few seconds and resolve.  They occur midsternal with no radiation.  She denies any SOB, LE edema, dizziness, palpitations or syncope.   Wt Readings from Last 3 Encounters:  12/20/13 152 lb 9.6 oz (69.219 kg)  08/09/13 151 lb 3.2 oz (68.584 kg)  11/23/12 150 lb (68.04 kg)     Past Medical History  Diagnosis Date  . Chronic headaches   . Tachycardia   . Mitral valve prolapse   . OSA (obstructive sleep apnea) 07/30/2010  . Anxiety   . Depression   . Endometriosis   . Genital warts   . Heart murmur     Current Outpatient Prescriptions  Medication Sig Dispense Refill  . Ascorbic Acid (VITAMIN C) 1000 MG tablet Take 1,000 mg by mouth daily.    . Biotin (BIOTIN 5000) 5 MG CAPS Take 1 capsule by mouth daily.    . Calcium Carbonate-Vitamin D (CALCIUM 600+D) 600-400 MG-UNIT per tablet Take 1 tablet by mouth daily.     . drospirenone-ethinyl estradiol (YAZ) 3-0.02 MG tablet TAKE 1 TABLET BY MOUTH DAILY. 1 Package 13  . Flaxseed, Linseed, (FLAXSEED OIL) 1000 MG CAPS Take 1 capsule by mouth daily.      . folic acid (FOLVITE) 800 MCG tablet Take 800 mcg by mouth daily.      . Glucosamine 500 MG CAPS Take 750 mg by mouth 2 (two) times daily.     Marland Kitchen. loratadine (CLARITIN) 10 MG tablet Take 10 mg by mouth daily.    . Methylsulfonylmethane (MSM) 1000 MG CAPS Take 1 capsule by mouth daily.      . Multiple Vitamin (MULTIVITAMIN) tablet Take 1 tablet by mouth daily.     . Multiple Vitamins-Minerals (HAIR/SKIN/NAILS) TABS Take 2 tablets by mouth  daily.      . vitamin E 400 UNIT capsule Take 400 Units by mouth daily.       No current facility-administered medications for this visit.    Allergies:   No Known Allergies  Social History:  The patient  reports that she has never smoked. She has never used smokeless tobacco. She reports that she does not drink alcohol or use illicit drugs.   Family History:  The patient's family history includes Diabetes in an other family member; Heart disease in her maternal grandfather, maternal grandmother, paternal grandfather, and paternal grandmother; Hypertension in an other family member; Skin cancer in her mother.   ROS:  Please see the history of present illness.      All other systems reviewed and negative.   PHYSICAL EXAM: VS:  BP 104/74 mmHg  Pulse 91  Ht 5\' 2"  (1.575 m)  Wt 152 lb 9.6 oz (69.219 kg)  BMI 27.90 kg/m2 Well nourished, well developed, in no acute distress HEENT: normal Neck: no JVD Cardiac:  normal S1, S2; RRR; no murmur Lungs:  clear to auscultation bilaterally, no wheezing, rhonchi or rales Abd: soft, nontender, no hepatomegaly Ext: no edema Skin: warm and  dry Neuro:  CNs 2-12 intact, no focal abnormalities noted  EKG:    NSR with no ST changes   ASSESSMENT AND PLAN:  1. 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  Will recheck echo in May to reevaluate the MV.   2.   Atypical sharp chest pain that only lasts seconds and is very atypical for coronary ischemia.  It sounds musculoskeletal and EKG is nonischemic.  No further workup necessary at this time.  Followup with me in 1 year  Signed, Armanda Magicraci Marquist Binstock, MD [facility] 12/20/2013 3:22 PM

## 2013-12-20 NOTE — Patient Instructions (Signed)
Your physician has requested that you have an echocardiogram IN MAY 2016. Echocardiography is a painless test that uses sound waves to create images of your heart. It provides your doctor with information about the size and shape of your heart and how well your heart's chambers and valves are working. This procedure takes approximately one hour. There are no restrictions for this procedure.  Your physician recommends that you continue on your current medications as directed. Please refer to the Current Medication list given to you today.  Your physician wants you to follow-up in: 1 year with Dr. Mayford Knifeurner. You will receive a reminder letter in the mail two months in advance. If you don't receive a letter, please call our office to schedule the follow-up appointment.

## 2014-01-04 ENCOUNTER — Encounter: Payer: Self-pay | Admitting: Cardiology

## 2014-01-05 ENCOUNTER — Encounter: Payer: Self-pay | Admitting: Cardiology

## 2014-03-21 ENCOUNTER — Ambulatory Visit (INDEPENDENT_AMBULATORY_CARE_PROVIDER_SITE_OTHER): Payer: 59 | Admitting: Diagnostic Neuroimaging

## 2014-03-21 ENCOUNTER — Encounter: Payer: Self-pay | Admitting: Diagnostic Neuroimaging

## 2014-03-21 VITALS — BP 104/70 | HR 69 | Ht 62.0 in | Wt 156.0 lb

## 2014-03-21 DIAGNOSIS — M542 Cervicalgia: Secondary | ICD-10-CM

## 2014-03-21 DIAGNOSIS — T148XXA Other injury of unspecified body region, initial encounter: Secondary | ICD-10-CM

## 2014-03-21 DIAGNOSIS — M62838 Other muscle spasm: Secondary | ICD-10-CM

## 2014-03-21 DIAGNOSIS — T148 Other injury of unspecified body region: Secondary | ICD-10-CM

## 2014-03-21 DIAGNOSIS — M25511 Pain in right shoulder: Secondary | ICD-10-CM

## 2014-03-21 NOTE — Progress Notes (Signed)
[facility]  PATIENT: Frances Ho DOB: 12-08-1970  REFERRING CLINICIAN: Bryson HaKwan HISTORY FROM: patient  REASON FOR VISIT: new consult    HISTORICAL  CHIEF COMPLAINT:  Chief Complaint  Patient presents with  . New Evaluation    neck pain    HISTORY OF PRESENT ILLNESS:   44 year old left-handed female here for evaluation of neck and shoulder pain on the right side since last week. Patient reports moving a refrigerator with her mother on Saturday. The next day she had significant pain and muscle spasm in her right cervical paraspinal region, trapezius region, with pain radiating to the right arm. Patient with a chiropractor and received acupuncture, electrical stimulation  treatments. Patient also used Advil as needed. Gradually symptoms have improved. Today patient feels 80% back to normal.  No pain, numbness or tingling on left side. No problems with the feet or legs.   REVIEW OF SYSTEMS: Full 14 system review of systems performed and notable only for fatigue ringing in ears running nose headache.  ALLERGIES: No Known Allergies  HOME MEDICATIONS: Outpatient Prescriptions Prior to Visit  Medication Sig Dispense Refill  . Biotin (BIOTIN 5000) 5 MG CAPS Take 1 capsule by mouth daily.    . drospirenone-ethinyl estradiol (YAZ) 3-0.02 MG tablet TAKE 1 TABLET BY MOUTH DAILY. 1 Package 13  . folic acid (FOLVITE) 800 MCG tablet Take 800 mcg by mouth daily.      . Glucosamine 500 MG CAPS Take 750 mg by mouth 2 (two) times daily.     . Methylsulfonylmethane (MSM) 1000 MG CAPS Take 1 capsule by mouth daily.      . Multiple Vitamin (MULTIVITAMIN) tablet Take 1 tablet by mouth daily.     . vitamin E 400 UNIT capsule Take 400 Units by mouth daily.      . Ascorbic Acid (VITAMIN C) 1000 MG tablet Take 1,000 mg by mouth daily.    . Calcium Carbonate-Vitamin D (CALCIUM 600+D) 600-400 MG-UNIT per tablet Take 1 tablet by mouth daily.     . Flaxseed, Linseed, (FLAXSEED OIL)  1000 MG CAPS Take 1 capsule by mouth daily.      Marland Kitchen. loratadine (CLARITIN) 10 MG tablet Take 10 mg by mouth daily.    . Multiple Vitamins-Minerals (HAIR/SKIN/NAILS) TABS Take 2 tablets by mouth daily.       No facility-administered medications prior to visit.    PAST MEDICAL HISTORY: Past Medical History  Diagnosis Date  . Chronic headaches   . Tachycardia   . Mitral valve prolapse   . OSA (obstructive sleep apnea) 07/30/2010  . Anxiety   . Depression   . Endometriosis   . Genital warts   . Heart murmur     PAST SURGICAL HISTORY: Past Surgical History  Procedure Laterality Date  . Foot surgery  2005    left  . Ovarian cyst removed    . Pelvic laparoscopy Bilateral 11/13/03    laparoscopic removal bilateral endometriomas  . Colposcopy  3/09    w/ECC-neg    FAMILY HISTORY: Family History  Problem Relation Age of Onset  . Diabetes      grandfather  . Hypertension      grandfather/grandmother  . Heart disease Maternal Grandmother   . Heart disease Maternal Grandfather   . Heart disease Paternal Grandmother   . Heart disease Paternal Grandfather   . Skin cancer Mother     SOCIAL HISTORY:  History   Social History  . Marital Status: Single  Spouse Name: N/A  . Number of Children: 0  . Years of Education: N/A   Occupational History  . clerical at [employer]    Social History Main Topics  . Smoking status: Never Smoker   . Smokeless tobacco: Never Used  . Alcohol Use: No  . Drug Use: No  . Sexual Activity: No     Comment: yaz   Other Topics Concern  . Not on file   Social History Narrative   Pt is single and lives with family.      PHYSICAL EXAM  Filed Vitals:   03/21/14 1105  BP: 104/70  Pulse: 69  Height:  (1.575 m)  Weight: 156 lb (70.761 kg)    Body mass index is 28.53 kg/(m^2).  No exam data present  No flowsheet data found.  GENERAL EXAM: Patient is in no distress; well developed, nourished and groomed; neck is supple;  MILD TENDERNESS AND MUSCLE SPASM IN RIGHT TRAPEZIUS AND RIGHT CERVICAL PARASPINAL REGION  CARDIOVASCULAR: Regular rate and rhythm, no murmurs, no carotid bruits  NEUROLOGIC: MENTAL STATUS: awake, alert, oriented to person, place and time, recent and remote memory intact, normal attention and concentration, language fluent, comprehension intact, naming intact, fund of knowledge appropriate CRANIAL NERVE: no papilledema on fundoscopic exam, pupils equal and reactive to light, visual fields full to confrontation, extraocular muscles intact, no nystagmus, facial sensation and strength symmetric, hearing intact, palate elevates symmetrically, uvula midline, shoulder shrug symmetric, tongue midline. MOTOR: normal bulk and tone, full strength in the BUE, BLE SENSORY: normal and symmetric to light touch, temperature, vibration  COORDINATION: finger-nose-finger, fine finger movements normal REFLEXES: deep tendon reflexes present and symmetric GAIT/STATION: narrow based gait; able to walk tandem; romberg is negative    DIAGNOSTIC DATA (LABS, IMAGING, TESTING) - I reviewed patient records, labs, notes, testing and imaging myself where available.  Lab Results  Component Value Date   WBC 5.0 05/28/2012   HGB 14.4 05/28/2012   HCT 41.6 05/28/2012   MCV 87.4 05/28/2012   PLT 222 05/28/2012      Component Value Date/Time   NA 135 08/09/2013 1655   K 3.8 08/09/2013 1655   CL 101 08/09/2013 1655   CO2 22 08/09/2013 1655   GLUCOSE 87 08/09/2013 1655   BUN 10 08/09/2013 1655   CREATININE 0.91 08/09/2013 1655   CALCIUM 8.8 08/09/2013 1655   PROT 7.2 08/09/2013 1655   ALBUMIN 4.3 08/09/2013 1655   AST 24 08/09/2013 1655   ALT 15 08/09/2013 1655   ALKPHOS 55 08/09/2013 1655   BILITOT 0.6 08/09/2013 1655   Lab Results  Component Value Date   CHOL 217* 08/09/2013   HDL 66 08/09/2013   LDLCALC 120* 08/09/2013   TRIG 154* 08/09/2013   CHOLHDL 3.3 08/09/2013   No results found for:  HGBA1C Lab Results  Component Value Date   VITAMINB12 854 05/28/2012   Lab Results  Component Value Date   TSH 2.090 08/09/2013       ASSESSMENT AND PLAN  44 y.o. year old female here with Right neck and shoulder muscle spasm and pain, following heavy lifting the day before. Symptoms gradually improving. Likely represents muscle strain / spasm. No evidence of cervical radiculopathy. Symptoms should gradually improve over time.    PLAN: - Stretching exercises, alternate ice pack and heat pad therapy, ibuprofen / NSAIDs as needed - May continue chiropractic therapy (acupuncture, electrical stimulation) as needed - May consider physical therapy referral if symptoms not improved in next  4-6 weeks  Return if symptoms worsen or fail to improve, for return to PCP.    Suanne Marker, MD 03/21/2014, 11:38 AM Certified in Neurology, Neurophysiology and Neuroimaging  [facility] [address], [address] [place], [place] [address] [phone]

## 2014-03-21 NOTE — Patient Instructions (Signed)
Monitor symptoms. Should continue to improve over next 4-6 weeks.  Continue ibuprofen, alternating ice pack / heat pad, stretching as needed.

## 2014-03-29 ENCOUNTER — Institutional Professional Consult (permissible substitution): Payer: Self-pay | Admitting: Neurology

## 2014-09-03 ENCOUNTER — Other Ambulatory Visit: Payer: Self-pay | Admitting: Obstetrics & Gynecology

## 2014-09-04 NOTE — Telephone Encounter (Signed)
Medication refill request: Yaz Last AEX:  08-09-13 Next AEX: 09-15-14 Last MMG (if hormonal medication request): 12-20-13 WNL Refill authorized: Please advise

## 2014-09-08 ENCOUNTER — Encounter: Payer: Self-pay | Admitting: Cardiology

## 2014-09-15 ENCOUNTER — Encounter: Payer: Self-pay | Admitting: Obstetrics & Gynecology

## 2014-09-15 ENCOUNTER — Other Ambulatory Visit: Payer: Self-pay | Admitting: Obstetrics & Gynecology

## 2014-09-15 ENCOUNTER — Ambulatory Visit (INDEPENDENT_AMBULATORY_CARE_PROVIDER_SITE_OTHER): Payer: 59 | Admitting: Obstetrics & Gynecology

## 2014-09-15 VITALS — BP 120/80 | HR 104 | Resp 15 | Ht 62.0 in | Wt 150.0 lb

## 2014-09-15 DIAGNOSIS — R102 Pelvic and perineal pain: Secondary | ICD-10-CM | POA: Diagnosis not present

## 2014-09-15 DIAGNOSIS — Z0000 Encounter for general adult medical examination without abnormal findings: Secondary | ICD-10-CM | POA: Diagnosis not present

## 2014-09-15 DIAGNOSIS — Z01419 Encounter for gynecological examination (general) (routine) without abnormal findings: Secondary | ICD-10-CM | POA: Diagnosis not present

## 2014-09-15 DIAGNOSIS — Z124 Encounter for screening for malignant neoplasm of cervix: Secondary | ICD-10-CM | POA: Diagnosis not present

## 2014-09-15 DIAGNOSIS — N809 Endometriosis, unspecified: Secondary | ICD-10-CM | POA: Diagnosis not present

## 2014-09-15 LAB — LIPID PANEL
Cholesterol: 209 mg/dL — ABNORMAL HIGH (ref 125–200)
HDL: 64 mg/dL (ref >=46)
LDL CALC: 115 mg/dL (ref <130)
Total CHOL/HDL Ratio: 3.3 Ratio (ref <=5.0)
Triglycerides: 152 mg/dL — ABNORMAL HIGH (ref <150)
VLDL: 30 mg/dL (ref <30)

## 2014-09-15 LAB — POCT URINALYSIS DIPSTICK
Bilirubin, UA: NEGATIVE
Blood, UA: NEGATIVE
Glucose, UA: NEGATIVE
Ketones, UA: NEGATIVE
Leukocytes, UA: NEGATIVE
Nitrite, UA: NEGATIVE
PROTEIN UA: NEGATIVE
SPEC GRAV UA: 1.01
Urobilinogen, UA: NEGATIVE
pH, UA: 6.5

## 2014-09-15 MED ORDER — DROSPIRENONE-ETHINYL ESTRADIOL 3-0.02 MG PO TABS: 1.0000 | ORAL_TABLET | Freq: Every day | ORAL | Status: DC

## 2014-09-15 NOTE — Addendum Note (Signed)
Addended by: Jerene Bears on: 09/15/2014 04:47 PM   Modules accepted: Orders, SmartSet

## 2014-09-15 NOTE — Progress Notes (Addendum)
Patient ID: Frances Ho, female   DOB: 17-Dec-1970, 44 y.o.   MRN: [id number]   44 y.o. G0P0 SingleCaucasianF here for annual exam.  Reports she is the same--"same old boring stuff".  Working at [employer].  Cycles are regular.  Flow lasts about three days.  Pt reports pain is stable--continues to have leg pain with her cycles.  She is having some increased sharp, shooting pains before her cycle.  As well having increased pain right before a bowel movement and then afterwards as well.    Pt and I have discussed hx of endometriosis in the past.  She went to [facility] for consultation regarding surgery several years ago.  Pt decided not to proceed at that time.  Wants to discuss again.  At that time (and now) preferred to see a female provider but really didn't want to have to travel to [facility].  D/W pt possibility of Dr. Andrey Farmer performing her surgery.  Advised will need to have a new consultation.  Also, has been several years since had last PUS.  Advised should consider again to see what ovaries look like and if there are larger endometriomas on ovaries.  Many questions regarding surgery, need for HRT, types of HRT, risks, hospital stay, recovery all discussed.  Patient's last menstrual period was 09/10/2014.          Sexually active: No.  The current method of family planning is OCP (estrogen/progesterone).    Exercising: No.  The patient does not participate in regular exercise at present. Smoker:  no  Health Maintenance: Pap:  08-09-13 WNL  History of abnormal Pap:  Yes 2009 Colposcopy done MMG:  12-20-13 WNL BI Rads1 Colonoscopy:  N/A BMD:   N/A TDaP:  03-30-08  Screening Labs:  Urine today: WNL    reports that she has never smoked. She has never used smokeless tobacco. She reports that she does not drink alcohol or use illicit drugs.  Past Medical History  Diagnosis Date  . Chronic headaches   . Tachycardia   . Mitral valve prolapse   . OSA (obstructive sleep apnea) 07/30/2010   . Anxiety   . Depression   . Endometriosis   . Genital warts   . Heart murmur     Past Surgical History  Procedure Laterality Date  . Foot surgery  2005    left  . Ovarian cyst removed    . Pelvic laparoscopy Bilateral 11/13/03    laparoscopic removal bilateral endometriomas  . Colposcopy  3/09    w/ECC-neg    Current Outpatient Prescriptions  Medication Sig Dispense Refill  . Biotin (BIOTIN 5000) 5 MG CAPS Take 1 capsule by mouth daily.    . fluorometholone (FML) 0.1 % ophthalmic suspension   1  . folic acid (FOLVITE) 800 MCG tablet Take 800 mcg by mouth daily.      . Glucosamine 500 MG CAPS Take 750 mg by mouth 2 (two) times daily.     . Glucosamine-Chondroitin-MSM 500-200-150 MG TABS Take 1 tablet by mouth.    . Methylsulfonylmethane (MSM) 1000 MG CAPS Take 1 capsule by mouth daily.      . Multiple Vitamin (MULTIVITAMIN) tablet Take 1 tablet by mouth daily.     . vitamin E 400 UNIT capsule Take 400 Units by mouth daily.      Marland Kitchen YAZ 3-0.02 MG tablet TAKE 1 TABLET EVERY DAY 28 tablet 1   No current facility-administered medications for this visit.    Family  History  Problem Relation Age of Onset  . Diabetes      grandfather  . Hypertension      grandfather/grandmother  . Heart disease Maternal Grandmother   . Heart disease Maternal Grandfather   . Heart disease Paternal Grandmother   . Heart disease Paternal Grandfather   . Skin cancer Mother     ROS:  Pertinent items are noted in HPI.  Otherwise, a comprehensive ROS was negative.  Exam:   BP 120/80 mmHg  Pulse 104  Resp 15  Ht  (1.575 m)  Wt 150 lb (68.04 kg)  BMI 27.43 kg/m2  LMP 09/10/2014    Height:  (157.5 cm)  Ht Readings from Last 3 Encounters:  09/15/14  (1.575 m)  03/21/14  (1.575 m)  12/20/13  (1.575 m)    General appearance: alert, cooperative and appears stated age Head: Normocephalic, without obvious abnormality, atraumatic Neck: no adenopathy, supple,  symmetrical, trachea midline and thyroid normal to inspection and palpation Lungs: clear to auscultation bilaterally Breasts: normal appearance, no masses or tenderness Heart: regular rate and rhythm Abdomen: soft, non-tender; bowel sounds normal; no masses,  no organomegaly Extremities: extremities normal, atraumatic, no cyanosis or edema Skin: Skin color, texture, turgor normal. No rashes or lesions Lymph nodes: Cervical, supraclavicular, and axillary nodes normal. No abnormal inguinal nodes palpated Neurologic: Grossly normal   Pelvic: External genitalia:  no lesions              Urethra:  normal appearing urethra with no masses, tenderness or lesions              Bartholins and Skenes: normal                 Vagina: normal appearing vagina with normal color and discharge, no lesions              Cervix: no lesions              Pap taken: Yes.   Bimanual Exam:  Uterus:  fixed, about 8-10 weeks in size              Adnexa: bilateral tenderness, discrete ovaries cannot be felt due to fixed pelvis               Rectovaginal: Confirms               Anus:  normal sphincter tone, no lesions  Chaperone was present for exam.  A:  Well Woman with normal exam Endometriosis, Stage IV, diagnosed with laparoscopy Depression/Anxiety H/O HR HPV with neg Pap.  Most recent HPV testing 2014 was negative. H/O genital warts Dysuria Pain with bowel movements On OCPs  P: Mammogram screening discussed pap smear only today Lipids, CMP today Return for TVUS to assess ovaries.  Pt considering hysterectomy again and referral to gyn/onc (Dr. Andrey Farmer).  She will let me know if desires referral when returns for PUS. Rx for yaz for one year to pharmacy return annually or prn  In additional to AEX, about 20 additional minutes spent discussing endometriosis, surgery, referral, recovery, and additional queitons documented in note above.

## 2014-09-18 LAB — COMPLETE METABOLIC PANEL WITH GFR
ALT: 20 U/L (ref 6–29)
AST: 23 U/L (ref 10–30)
Albumin: 4 g/dL (ref 3.6–5.1)
Alkaline Phosphatase: 59 U/L (ref 33–115)
BILIRUBIN TOTAL: 0.4 mg/dL (ref 0.2–1.2)
BUN: 16 mg/dL (ref 7–25)
CO2: 23 mmol/L (ref 20–31)
Calcium: 8.5 mg/dL — ABNORMAL LOW (ref 8.6–10.2)
Chloride: 105 mmol/L (ref 98–110)
Creat: 0.9 mg/dL (ref 0.50–1.10)
GFR, EST NON AFRICAN AMERICAN: 79 mL/min (ref >=60)
GFR, Est African American: >89 mL/min (ref >=60)
Glucose, Bld: 81 mg/dL (ref 65–99)
Potassium: 3.7 mmol/L (ref 3.5–5.3)
SODIUM: 140 mmol/L (ref 135–146)
Total Protein: 7 g/dL (ref 6.1–8.1)

## 2014-09-18 NOTE — Addendum Note (Signed)
Addended by: Elisha Headland on: 09/18/2014 09:18 AM   Modules accepted: Orders, SmartSet

## 2014-09-19 LAB — IPS PAP TEST WITH REFLEX TO HPV

## 2014-09-29 ENCOUNTER — Telehealth: Payer: Self-pay | Admitting: Obstetrics & Gynecology

## 2014-09-29 NOTE — Telephone Encounter (Signed)
Patient says she is waiting on someone to call her insurance about benefits for an ultrasound.

## 2014-10-09 ENCOUNTER — Telehealth: Payer: Self-pay

## 2014-10-09 DIAGNOSIS — N809 Endometriosis, unspecified: Secondary | ICD-10-CM

## 2014-10-09 NOTE — Telephone Encounter (Signed)
New order placed for PUS with Dr.Miller. Order linked to appointment.  Cc: Braxton Feathers  Routing to provider for final review. Patient agreeable to disposition. Will close encounter.

## 2014-10-09 NOTE — Telephone Encounter (Signed)
-----   Message from Jari Favre sent at 10/06/2014  4:38 PM EDT ----- Could you please enter an order for PUS for Frances Ho. She is scheduled with dr Hyacinth Meeker on 10/19/14 at 4 per her request.   Thank you!!  Becky

## 2014-10-19 ENCOUNTER — Other Ambulatory Visit: Payer: 59

## 2014-10-19 ENCOUNTER — Other Ambulatory Visit: Payer: 59 | Admitting: Obstetrics & Gynecology

## 2014-10-19 ENCOUNTER — Ambulatory Visit (INDEPENDENT_AMBULATORY_CARE_PROVIDER_SITE_OTHER): Payer: 59

## 2014-10-19 ENCOUNTER — Encounter: Payer: Self-pay | Admitting: Obstetrics & Gynecology

## 2014-10-19 ENCOUNTER — Ambulatory Visit (INDEPENDENT_AMBULATORY_CARE_PROVIDER_SITE_OTHER): Payer: 59 | Admitting: Obstetrics & Gynecology

## 2014-10-19 VITALS — BP 120/78 | HR 90

## 2014-10-19 DIAGNOSIS — N809 Endometriosis, unspecified: Secondary | ICD-10-CM | POA: Diagnosis not present

## 2014-10-19 NOTE — Progress Notes (Signed)
44 y.o. Singlefemale here for a pelvic ultrasound due to chronic pelvic pain and history of stage IV endometriosis.  Pt and I have discussed surgical options many, many, many times.  She was referred to [facility] to the pelvic pain clinic in the past.  Declined surgery after that consultation.  Is contemplating this again.  Does not want to go to [facility].  Advised I would refer her to gyn/onc if she decided she wanted to proceed with surgery.  Pt continues to be unsure.  On OCPs with control of symptoms.    No LMP recorded.  Sexually active:  no  Contraception: oral contraceptives (estrogen/progesterone)  FINDINGS: UTERUS: 9.9 x 4.7 x 3.9cm EMS: 4.31mm ADNEXA:  Left ovary 2.7 x 1.2 x 1.6cm with 2.3cm cyst with internal echoes, avascular, consistent with endometrioma    Right ovary 2.9 x 2.0 x 0.8cm CUL DE SAC: mild free fluid, clear in appearance on ultrasoun Lateral to umbilicus and superior to right ovary is a 2 x 2 x 5cm inclusion cyst.  This is separate from the ovary and uterus.  Avascular.  Only seen with transabdominal imaging.    Findings reviewed with pt.  She does not want to proceed with any follow-up or additional testing at this time.  Continues to consider surgery but not ready for referral.  Knows to call if changes mind.  All questions answered.    Assessment:  H/o stage IV endometriosis with 2.3cm left probable endometrioma and 5cm inclusion cyst on ultrasound today.  Plan: Follow up for AEX and stay on OCPs.  Pt will call if desires referral for surgery.  ~15 minutes spent with patient >50% of time was in face to face discussion of above.

## 2014-11-02 ENCOUNTER — Other Ambulatory Visit: Payer: 59 | Admitting: Obstetrics & Gynecology

## 2014-11-02 ENCOUNTER — Other Ambulatory Visit: Payer: 59

## 2014-11-02 ENCOUNTER — Encounter: Payer: Self-pay | Admitting: Cardiology

## 2014-11-06 ENCOUNTER — Telehealth (HOSPITAL_COMMUNITY): Payer: Self-pay | Admitting: Radiology

## 2014-11-14 NOTE — Telephone Encounter (Signed)
Patient called to reschedule echo due to office closure.

## 2014-12-13 ENCOUNTER — Other Ambulatory Visit: Payer: Self-pay

## 2014-12-13 DIAGNOSIS — Z1231 Encounter for screening mammogram for malignant neoplasm of breast: Secondary | ICD-10-CM

## 2014-12-14 ENCOUNTER — Other Ambulatory Visit (HOSPITAL_COMMUNITY): Payer: 59

## 2014-12-14 ENCOUNTER — Other Ambulatory Visit (HOSPITAL_COMMUNITY): Payer: Self-pay

## 2014-12-18 ENCOUNTER — Other Ambulatory Visit (HOSPITAL_COMMUNITY): Payer: 59

## 2014-12-22 ENCOUNTER — Other Ambulatory Visit (HOSPITAL_COMMUNITY): Payer: Self-pay

## 2014-12-22 ENCOUNTER — Ambulatory Visit: Payer: Self-pay | Admitting: Cardiology

## 2015-01-19 ENCOUNTER — Ambulatory Visit
Admission: RE | Admit: 2015-01-19 | Discharge: 2015-01-19 | Disposition: A | Discharge status: Home / Self Care | Payer: 59 | Source: Ambulatory Visit

## 2015-01-19 DIAGNOSIS — Z1231 Encounter for screening mammogram for malignant neoplasm of breast: Secondary | ICD-10-CM

## 2015-01-31 ENCOUNTER — Telehealth: Payer: Self-pay | Admitting: Obstetrics & Gynecology

## 2015-01-31 MED ORDER — DROSPIRENONE-ETHINYL ESTRADIOL 3-0.03 MG PO TABS: 1.0000 | ORAL_TABLET | Freq: Every day | ORAL | Status: DC

## 2015-01-31 NOTE — Telephone Encounter (Signed)
Spoke with patient. Patient states that her current OCP Dianah FieldYaz will cost her $75 dollars per month. Generic for Yaz per patient is more expensive through her insurance. Patient was advised by the pharmacy that Yasmin would be around $37 a month. Patient would like to switch to Yasmin at this time to reduce cost. Is due to pick up and start a new pack at this time. Advised I will speak with the covering provider regarding medication change request and return call. Patient is agreeable. Requests return call to her cell phone number on file.  Dr.Silva, okay to switch patient to Yasmin at this time?

## 2015-01-31 NOTE — Telephone Encounter (Signed)
Patient wanting to speak with nurse about switching her medication.

## 2015-01-31 NOTE — Telephone Encounter (Signed)
Ok to switch to Yasmin until annual exam is due with Dr. Hyacinth MeekerMiller.

## 2015-01-31 NOTE — Telephone Encounter (Signed)
Spoke with patient. Advised of message as seen below from Dr.Silva. Rx for Yasmin #1 [id number] sent to pharmacy on file. Aex is scheduled for 01/04/2016 at 10:30 am with Dr.Miller.  Cc: Dr.Miller  Routing to covering provider for final review. Patient agreeable to disposition. Will close encounter.

## 2015-02-02 ENCOUNTER — Ambulatory Visit: Payer: Self-pay | Admitting: Cardiology

## 2015-02-02 ENCOUNTER — Telehealth: Payer: Self-pay | Admitting: Obstetrics & Gynecology

## 2015-02-02 NOTE — Telephone Encounter (Signed)
Spoke with patient. Patient states "Who did you speak with the other day about changing my prescription?" Advised I spoke with Dr.Silva as Dr.Miller is out of the office on Wednesday's. Please see telephone encounter from 01/31/2015. Patient called in requesting medication change from Yaz to Yasmin due to cost. "I need you to check with Dr.Miller about this because she knows me inside and out, literally." Advised Dr.Silva and Dr.Miller work closely together and this recommendation was reviewed by Dr.Miller. Patient insists that I speak with Dr.Miller and return her call.

## 2015-02-02 NOTE — Telephone Encounter (Signed)
Patient returning your call please call [phone].

## 2015-02-02 NOTE — Telephone Encounter (Signed)
Patient has a medication question. 

## 2015-02-02 NOTE — Telephone Encounter (Signed)
Returned call to patient. Advised I have spoken with Dr.Miller who states she reviewed note from when she was out and is in agreeable with patient changing OCP to Yasmin. Dr.Miller reviews all notes from days she is out of the office. Patient is agreeable.  Routing to provider for final review. Patient agreeable to disposition. Will close encounter.

## 2015-02-02 NOTE — Telephone Encounter (Signed)
Left message to call Shalece Staffa at [phone]. 

## 2015-02-07 ENCOUNTER — Ambulatory Visit (INDEPENDENT_AMBULATORY_CARE_PROVIDER_SITE_OTHER): Payer: 59 | Admitting: Cardiology

## 2015-02-07 ENCOUNTER — Other Ambulatory Visit: Payer: Self-pay

## 2015-02-07 ENCOUNTER — Encounter: Payer: Self-pay | Admitting: Cardiology

## 2015-02-07 ENCOUNTER — Ambulatory Visit (HOSPITAL_COMMUNITY): Payer: 59 | Attending: Cardiovascular Disease

## 2015-02-07 VITALS — BP 104/70 | HR 76 | Ht 62.0 in | Wt 152.4 lb

## 2015-02-07 DIAGNOSIS — I340 Nonrheumatic mitral (valve) insufficiency: Secondary | ICD-10-CM | POA: Diagnosis not present

## 2015-02-07 DIAGNOSIS — I341 Nonrheumatic mitral (valve) prolapse: Secondary | ICD-10-CM

## 2015-02-07 DIAGNOSIS — R079 Chest pain, unspecified: Secondary | ICD-10-CM | POA: Diagnosis not present

## 2015-02-07 NOTE — Progress Notes (Signed)
Cardiology Office Note   Date:  02/07/2015   ID:  Frances Ho, DOB 31-Jul-1970, MRN [id number]  PCP:  No PCP Per Patient    Chief Complaint  Patient presents with  . Mitral Valve Prolapse      History of Present Illness: Frances Ho is a 45 y.o. 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  She denies any SOB, LE edema, dizziness, palpitations or syncope.  She has been having some tightness in her chest but has had a lot of stress.  It is located on the left side of her chest with no radiation.  It is not associated with nausea or diaphoresis.  She has DOE only when going up stairs.  She denies any LE edema, dizziness, palpitations or syncope.      Past Medical History  Diagnosis Date  . Chronic headaches   . Tachycardia   . Mitral valve prolapse   . OSA (obstructive sleep apnea) 07/30/2010  . Anxiety   . Depression   . Endometriosis   . Genital warts   . Heart murmur     Past Surgical History  Procedure Laterality Date  . Foot surgery  2005    left  . Ovarian cyst removed    . Pelvic laparoscopy Bilateral 11/13/03    laparoscopic removal bilateral endometriomas  . Colposcopy  3/09    w/ECC-neg     Current Outpatient Prescriptions  Medication Sig Dispense Refill  . Biotin (BIOTIN 5000) 5 MG CAPS Take 1 capsule by mouth daily.    . drospirenone-ethinyl estradiol (YASMIN 28) 3-0.03 MG tablet Take 1 tablet by mouth daily. 1 Package 11  . fluorometholone (FML) 0.1 % ophthalmic suspension Place 1 drop into both eyes daily as needed (DRY EYES).   1  . folic acid (FOLVITE) 800 MCG tablet Take 800 mcg by mouth daily.      Marland Kitchen GLUCOSA-CHONDR-NA CHONDR-MSM PO Take 1,500 mg by mouth daily.    . Methylsulfonylmethane (MSM) 1000 MG CAPS Take 1 capsule by mouth daily.      . Multiple Vitamin (MULTIVITAMIN) tablet Take 1 tablet by mouth daily.     . vitamin E 400 UNIT capsule Take 400 Units by mouth  daily.       No current facility-administered medications for this visit.    Allergies:   Review of patient's allergies indicates no known allergies.    Social History:  The patient  reports that she has never smoked. She has never used smokeless tobacco. She reports that she does not drink alcohol or use illicit drugs.   Family History:  The patient's family history includes Heart disease in her maternal grandfather, maternal grandmother, paternal grandfather, and paternal grandmother; Skin cancer in her mother.    ROS:  Please see the history of present illness.   Otherwise, review of systems are positive for none.   All other systems are reviewed and negative.    PHYSICAL EXAM: VS:  BP 104/70 mmHg  Pulse 76  Ht 5\' 2"  (1.575 m)  Wt 152 lb 6.7 oz (69.137 kg)  BMI 27.87 kg/m2 , BMI Body mass index is 27.87 kg/(m^2). GEN: Well nourished, well developed, in no acute distress HEENT: normal Neck: no JVD, carotid bruits, or masses Cardiac: RRR; no murmurs, rubs, or gallops,no edema  Respiratory:  clear to auscultation bilaterally,  normal work of breathing GI: soft, nontender, nondistended, + BS MS: no deformity or atrophy Skin: warm and dry, no rash Neuro:  Strength and sensation are intact Psych: euthymic mood, full affect   EKG:  EKG is ordered today. The ekg ordered today demonstrates NSR at 77bpm with no ST changes and PAC   Recent Labs: 09/15/2014: ALT 20; BUN 16; Creat 0.90; Potassium 3.7; Sodium 140    Lipid Panel    Component Value Date/Time   CHOL 209* 09/15/2014 1138   TRIG 152* 09/15/2014 1138   HDL 64 09/15/2014 1138   CHOLHDL 3.3 09/15/2014 1138   VLDL 30 09/15/2014 1138   LDLCALC 115 09/15/2014 1138      Wt Readings from Last 3 Encounters:  02/07/15 152 lb 6.7 oz (69.137 kg)  09/15/14 150 lb (68.04 kg)  03/21/14 156 lb (70.761 kg)    ASSESSMENT AND PLAN:  1. 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 She got her echo done prior to OV so  will notify her of results once read.   2. Chest pain that sounds atypical and could be associated with MVP syndrome.  I will get an ETT to rule out ischemia.       Current medicines are reviewed at length with the patient today.  The patient does not have concerns regarding medicines.  The following changes have been made:  no change  Labs/ tests ordered today: See above Assessment and Plan No orders of the defined types were placed in this encounter.     Disposition:   FU with me in 1 year  Signed, Quintella ReichertURNER,TRACI R, MD  02/07/2015 3:31 PM    [facility] [address], [place], [place]  [address] Phone: [phone]; Fax: [phone]

## 2015-02-07 NOTE — Patient Instructions (Signed)
Medication Instructions:  Your physician recommends that you continue on your current medications as directed. Please refer to the Current Medication list given to you today.   Labwork: None  Testing/Procedures: Your physician has requested that you have an exercise tolerance test. For further information please visit [email or web address]. Please also follow instruction sheet, as given.  Follow-Up: Your physician wants you to follow-up in: 1 year with Dr. Turner. You will receive a reminder letter in the mail two months in advance. If you don't receive a letter, please call our office to schedule the follow-up appointment.   Any Other Special Instructions Will Be Listed Below (If Applicable).     If you need a refill on your cardiac medications before your next appointment, please call your pharmacy.   

## 2015-02-26 ENCOUNTER — Telehealth: Payer: Self-pay | Admitting: Cardiology

## 2015-02-26 NOTE — Telephone Encounter (Signed)
New problem    Pt has a GXT scheduled and stated her legs hurt and she want to know if she can have one done on a bicycle. Please advise.

## 2015-02-27 NOTE — Telephone Encounter (Signed)
Patient st she has a herniated disc in her back that causes leg pain.  She requests a different kind of stress test.  To Dr. Mayford Knife for recommendations.

## 2015-02-27 NOTE — Telephone Encounter (Signed)
Dobutamine echo will avoid radiation

## 2015-02-27 NOTE — Telephone Encounter (Signed)
Left message to call back  

## 2015-02-27 NOTE — Telephone Encounter (Signed)
Follow Up ° ° ° ° °Pt is returning call from earlier. Please call. °

## 2015-03-02 ENCOUNTER — Encounter: Payer: 59 | Admitting: Nurse Practitioner

## 2015-03-06 NOTE — Telephone Encounter (Signed)
Left message to call back  

## 2015-03-06 NOTE — Telephone Encounter (Signed)
Patient refuses dobutamine echo.  She agrees to try GXT. She will call back if she decides to refuse.

## 2015-03-06 NOTE — Telephone Encounter (Signed)
Follow Up ° °Pt returned call//  °

## 2015-03-14 ENCOUNTER — Telehealth: Payer: Self-pay | Admitting: Cardiology

## 2015-03-14 NOTE — Telephone Encounter (Signed)
Pt did not need to talk to a nurse.

## 2015-03-16 ENCOUNTER — Encounter: Payer: 59 | Admitting: Nurse Practitioner

## 2015-04-30 ENCOUNTER — Telehealth: Payer: Self-pay | Admitting: Cardiology

## 2015-04-30 ENCOUNTER — Emergency Department (HOSPITAL_BASED_OUTPATIENT_CLINIC_OR_DEPARTMENT_OTHER)
Admission: EM | Admit: 2015-04-30 | Discharge: 2015-04-30 | Disposition: A | Discharge status: Home / Self Care | Payer: 59 | Attending: Emergency Medicine | Admitting: Emergency Medicine

## 2015-04-30 ENCOUNTER — Encounter (HOSPITAL_BASED_OUTPATIENT_CLINIC_OR_DEPARTMENT_OTHER): Payer: Self-pay

## 2015-04-30 ENCOUNTER — Emergency Department (HOSPITAL_BASED_OUTPATIENT_CLINIC_OR_DEPARTMENT_OTHER): Payer: 59

## 2015-04-30 ENCOUNTER — Telehealth: Payer: Self-pay | Admitting: Obstetrics & Gynecology

## 2015-04-30 DIAGNOSIS — Z79899 Other long term (current) drug therapy: Secondary | ICD-10-CM | POA: Diagnosis not present

## 2015-04-30 DIAGNOSIS — Z8679 Personal history of other diseases of the circulatory system: Secondary | ICD-10-CM | POA: Diagnosis not present

## 2015-04-30 DIAGNOSIS — Z8742 Personal history of other diseases of the female genital tract: Secondary | ICD-10-CM | POA: Insufficient documentation

## 2015-04-30 DIAGNOSIS — Z79818 Long term (current) use of other agents affecting estrogen receptors and estrogen levels: Secondary | ICD-10-CM | POA: Diagnosis not present

## 2015-04-30 DIAGNOSIS — R000 Tachycardia, unspecified: Secondary | ICD-10-CM | POA: Diagnosis not present

## 2015-04-30 DIAGNOSIS — R011 Cardiac murmur, unspecified: Secondary | ICD-10-CM | POA: Diagnosis not present

## 2015-04-30 DIAGNOSIS — Z8619 Personal history of other infectious and parasitic diseases: Secondary | ICD-10-CM | POA: Diagnosis not present

## 2015-04-30 DIAGNOSIS — R0789 Other chest pain: Secondary | ICD-10-CM

## 2015-04-30 DIAGNOSIS — R0602 Shortness of breath: Secondary | ICD-10-CM | POA: Diagnosis present

## 2015-04-30 DIAGNOSIS — Z8659 Personal history of other mental and behavioral disorders: Secondary | ICD-10-CM | POA: Insufficient documentation

## 2015-04-30 DIAGNOSIS — G8929 Other chronic pain: Secondary | ICD-10-CM | POA: Diagnosis not present

## 2015-04-30 LAB — BASIC METABOLIC PANEL WITH GFR
Anion gap: 6 (ref 5–15)
BUN: 12 mg/dL (ref 6–20)
CO2: 24 mmol/L (ref 22–32)
Calcium: 8.6 mg/dL — ABNORMAL LOW (ref 8.9–10.3)
Chloride: 109 mmol/L (ref 101–111)
Creatinine, Ser: 0.83 mg/dL (ref 0.44–1.00)
GFR calc Af Amer: >60 mL/min
GFR calc non Af Amer: >60 mL/min
Glucose, Bld: 101 mg/dL — ABNORMAL HIGH (ref 65–99)
Potassium: 3.8 mmol/L (ref 3.5–5.1)
Sodium: 139 mmol/L (ref 135–145)

## 2015-04-30 LAB — D-DIMER, QUANTITATIVE: D-Dimer, Quant: 0.33 ug{FEU}/mL (ref 0.00–0.50)

## 2015-04-30 LAB — CBC WITH DIFFERENTIAL/PLATELET
Basophils Absolute: 0 K/uL (ref 0.0–0.1)
Basophils Relative: 0 %
Eosinophils Absolute: 0 K/uL (ref 0.0–0.7)
Eosinophils Relative: 0 %
HCT: 39.5 % (ref 36.0–46.0)
Hemoglobin: 13.7 g/dL (ref 12.0–15.0)
Lymphocytes Relative: 20 %
Lymphs Abs: 1.2 K/uL (ref 0.7–4.0)
MCH: 31.4 pg (ref 26.0–34.0)
MCHC: 34.7 g/dL (ref 30.0–36.0)
MCV: 90.6 fL (ref 78.0–100.0)
Monocytes Absolute: 0.4 K/uL (ref 0.1–1.0)
Monocytes Relative: 7 %
Neutro Abs: 4.2 K/uL (ref 1.7–7.7)
Neutrophils Relative %: 73 %
Platelets: 220 K/uL (ref 150–400)
RBC: 4.36 MIL/uL (ref 3.87–5.11)
RDW: 11.8 % (ref 11.5–15.5)
WBC: 5.7 K/uL (ref 4.0–10.5)

## 2015-04-30 LAB — TROPONIN I: Troponin I: <0.03 ng/mL

## 2015-04-30 MED ORDER — IBUPROFEN 400 MG PO TABS
600.0000 mg | ORAL_TABLET | Freq: Once | ORAL | Status: DC
  Filled 2015-04-30: qty 1

## 2015-04-30 NOTE — ED Notes (Signed)
Pt made aware to return if symptoms worsen or if any life threatening symptoms occur.   

## 2015-04-30 NOTE — Telephone Encounter (Signed)
Patient returning call.

## 2015-04-30 NOTE — Telephone Encounter (Signed)
[facility]ford Knifeurner at approximately [phone]. They will call patient directly and discuss.  Routing to provider for final review. Will close encounter.

## 2015-04-30 NOTE — Telephone Encounter (Signed)
Spoke with Kennon RoundsSally at Dr. Rondel BatonMiller's office. Kennon RoundsSally st that the patient called the GYN with c/o chest pain on the L side and shortness of breath for 2 days. They advised her to go to the ED and she declined - [facility] and not "wait in line for 5 hours at the hospital."  [facility], but she did not answer her phone. Left message to call back and to ask for a triage nurse if she is having active SOB.

## 2015-04-30 NOTE — Telephone Encounter (Signed)
Spoke with patient at time of incoming call.  8 Minutes for triage call.  She states that yesterday, 04/30/15 , developed pain on inspiration, states it is worse on the L side, but feels SOB with taking a a deep breath and that "I can hear myself trying to take a deep breath."  She denies chest pain. She denies recent URI symptoms. No cough.  Patient currently on Yasmin, started in January. She reports she has not taken Yasmin today or yesterday.  Advised patient needs evaluation with PCP or Urgent Care or Emergency Department to rule out any emergency symptoms such as blood clots. Patient states she does not have a PCP and does not want to go to Urgent Care or Emergency Department states "I don't ever get good care there."  Discussed with patient that she needs evaluation due to her complaints and it is important for evaluation today, as soon as possible. Patient does have a pulmonologist but she states she has not been there in quite some time and may need a referral. Advised that she needs evaluation today and referral process may not meet those needs. She asks if she should call her cardiologist. I advised that she can, however, my advice remains the same and that she should be evaluated today in urgent care or emergency department as soon as possible.  Patient verbalized understanding. She states she would like to know what she should take for birth control at this time and instructions from Dr. Hyacinth MeekerMiller and I advised that she needs evaluation first before any changes can be made. Patient agreeable. Advised will discuss with Dr. Hyacinth MeekerMiller and return call if any additional instructions.

## 2015-04-30 NOTE — ED Notes (Signed)
Pt on heart monitor 

## 2015-04-30 NOTE — Telephone Encounter (Signed)
Patient says her Cardiologist needs an order from our office before they will do a EKG. Dr.Turner office at [phone].

## 2015-04-30 NOTE — Telephone Encounter (Signed)
Patient st she is in the ED. Instructed patient to stay there, receive treatment and to call when she is discharged for appropriate follow-up. Patient was grateful for call.

## 2015-04-30 NOTE — Telephone Encounter (Signed)
New message    Patient call Dr. Hyacinth MeekerMiller office C/O sob on left side patient on birth control pills.     The office advise patient to go to emergency room which patient decline.    Patient call the office was advise to have Dr. Hyacinth MeekerMiller office fax over an order for EKG.     Dr. Hyacinth MeekerMiller GYN- office calling back stating they do not send an order over for EKG - would like a nurse to call her back

## 2015-04-30 NOTE — Telephone Encounter (Signed)
Follow Up Call:    Ms. Frances Ho is calling because she is having some SOB and is wanting to be seen today .

## 2015-04-30 NOTE — Telephone Encounter (Signed)
Spoke with pt.  My concern for PE discussed with pt given SOB and pain with inspiration.  Risk of death discussed if PE is undiagnosed.  Pt states she is better than yesterday when it was "awful".  She refuses going to the ER but she will go to Urgent Care near her home.  She understands my concern and states she appreciates the phone call.  Advised she may end up being sent to the ER.  She states she will go if advised by Urgent Care.  Pt states she will leave her home now.

## 2015-04-30 NOTE — Telephone Encounter (Signed)
Call to Dr Norris Crossurner's office. Left message with Marlowe KaysGesila who will send message to Dr Norris Crossurner's nurse. Advised of call from patient this am and that patient was advised to go to ED.  Explained we are returning call, not calling to instruct them on how to manage her care.

## 2015-04-30 NOTE — ED Provider Notes (Signed)
CSN: [id number]     Arrival date & time 04/30/15  1421 History   First MD Initiated Contact with Patient 04/30/15 1621     Chief Complaint  Patient presents with  . Shortness of Breath     (Consider location/radiation/quality/duration/timing/severity/associated sxs/prior Treatment) HPI   Frances Ho is a 45 year old female witha past medical history presents the ED complaining of shortness of breath. Patient states that yesterday afternoon she felt she was gasping for air and was unable to speak in complete sentences due to shortness of breath. Patient also had associated pain in her left rib cage under her breast. The pain increased with deep inspiration. Patient states that her shortness of breath has somewhat improved today with the pain is still present. Patient states that she began a new birth control in January and she knew that one of the side effects of shortness of breath. She contacted her OB/GYN provider today who recommended that she come to the ER for further evaluation. Patient is concerned for blood clots. She has no history of blood clots. No recent travel. No largely swelling or calf tenderness. Denies fevers, chills, recent illness, cough, dizziness, syncope.  Past Medical History  Diagnosis Date  . Chronic headaches   . Tachycardia   . Mitral valve prolapse   . OSA (obstructive sleep apnea) 07/30/2010  . Anxiety   . Depression   . Endometriosis   . Genital warts   . Heart murmur    Past Surgical History  Procedure Laterality Date  . Foot surgery  2005    left  . Ovarian cyst removed    . Pelvic laparoscopy Bilateral 11/13/03    laparoscopic removal bilateral endometriomas  . Colposcopy  3/09    w/ECC-neg   Family History  Problem Relation Age of Onset  . Diabetes      grandfather  . Hypertension      grandfather/grandmother  . Heart disease Maternal Grandmother   . Heart disease Maternal Grandfather   . Heart disease Paternal Grandmother   . Heart  disease Paternal Grandfather   . Skin cancer Mother    Social History  Substance Use Topics  . Smoking status: Never Smoker   . Smokeless tobacco: Never Used  . Alcohol Use: No   OB History    Gravida Para Term Preterm AB TAB SAB Ectopic Multiple Living   0              Review of Systems  All other systems reviewed and are negative.     Allergies  Review of patient's allergies indicates no known allergies.  Home Medications   Prior to Admission medications   Medication Sig Start Date End Date Taking? Authorizing Provider  Biotin (BIOTIN 5000) 5 MG CAPS Take 1 capsule by mouth daily.    Historical Provider, MD  drospirenone-ethinyl estradiol (YASMIN 28) 3-0.03 MG tablet Take 1 tablet by mouth daily. 01/31/15   Brook Rosalin HawkingE Amundson C Silva, MD  fluorometholone (FML) 0.1 % ophthalmic suspension Place 1 drop into both eyes daily as needed (DRY EYES).  03/05/14   Historical Provider, MD  folic acid (FOLVITE) 800 MCG tablet Take 800 mcg by mouth daily.      Historical Provider, MD  GLUCOSA-CHONDR-NA CHONDR-MSM PO Take 1,500 mg by mouth daily.    Historical Provider, MD  Methylsulfonylmethane (MSM) 1000 MG CAPS Take 1 capsule by mouth daily.      Historical Provider, MD  Multiple Vitamin (MULTIVITAMIN) tablet Take 1 tablet by  mouth daily.     Historical Provider, MD  vitamin E 400 UNIT capsule Take 400 Units by mouth daily.      Historical Provider, MD   BP 139/79 mmHg  Pulse 112  Temp(Src) 98.4 F (36.9 C) (Oral)  Resp 16  Ht  (1.575 m)  Wt 68.04 kg  BMI 27.43 kg/m2  SpO2 98%  LMP 04/23/2015 Physical Exam  Constitutional: She is oriented to person, place, and time. She appears well-developed and well-nourished. No distress.  HENT:  Head: Normocephalic and atraumatic.  Mouth/Throat: No oropharyngeal exudate.  Eyes: Conjunctivae and EOM are normal. Pupils are equal, round, and reactive to light. Right eye exhibits no discharge. Left eye exhibits no discharge. No scleral  icterus.  Neck: Neck supple. No JVD present.  Cardiovascular: Regular rhythm, normal heart sounds and intact distal pulses.  Exam reveals no gallop and no friction rub.   No murmur heard. Tachycardia to 104 bpm  Pulmonary/Chest: Effort normal and breath sounds normal. No respiratory distress. She has no wheezes. She has no rales. She exhibits tenderness.    Abdominal: Soft. She exhibits no distension. There is no tenderness. There is no guarding.  Musculoskeletal: Normal range of motion. She exhibits no edema.  Lymphadenopathy:    She has no cervical adenopathy.  Neurological: She is alert and oriented to person, place, and time.  Strength 5/5 throughout. No sensory deficits.    Skin: Skin is warm and dry. No rash noted. She is not diaphoretic. No erythema. No pallor.  Psychiatric: She has a normal mood and affect. Her behavior is normal.  Nursing note and vitals reviewed.   ED Course  Procedures (including critical care time) Labs Review Labs Reviewed  BASIC METABOLIC PANEL - Abnormal; Notable for the following:    Glucose, Bld 101 (*)    Calcium 8.6 (*)    All other components within normal limits  CBC WITH DIFFERENTIAL/PLATELET  TROPONIN I  D-DIMER, QUANTITATIVE (NOT AT [facility])    Imaging Review Dg Chest 2 View  04/30/2015  CLINICAL DATA:  Shortness of breath since yesterday, pain with deep breathing EXAM: CHEST  2 VIEW COMPARISON:  None FINDINGS: Normal heart size, mediastinal contours, and pulmonary vascularity. Lungs clear. No pleural effusion or pneumothorax. Bones unremarkable. IMPRESSION: Normal exam. Electronically Signed   By: Ulyses Southward M.D.   On: 04/30/2015 17:19   I have personally reviewed and evaluated these images and lab results as part of my medical decision-making.   EKG Interpretation   Date/Time:  Monday April 30 2015 17:02:27 EDT Ventricular Rate:  101 PR Interval:  142 QRS Duration: 91 QT Interval:  354 QTC Calculation: 459 R Axis:   74 Text  Interpretation:  Sinus tachycardia NO ISCHEMIC CHANGES. OTHERWISE  NORMAL Confirmed by Donnald Garre, MD, Lebron Conners [id number]) on 04/30/2015 5:26:08 PM      MDM   Final diagnoses:  Chest wall pain   45 year old female with history of mitral valve prolapse presents the ED with shortness of breath and left chest wall pain onset yesterday. Last cardiology visit was 02/07/15 patient had a normal echo with EF of 65%. Patient states her symptoms were severe yesterday but have almost completely resolved today. However her OB/GYN tomorrow to be evaluated for blood clots as she recently changed her birth control in January. Patient is mild tachycardic in ED, 104 bpm. She has a history of tachycardia which is well detailed in her chart. Patient appears well, in no apparent distress. No labored breathing, speaking  in complete sentences. Patient is low risk well's criteria. We'll obtain d-dimer. EKG reveals sinus Cardia, no ischemic changes. Troponin within normal limits. D-dimer is normal. Doubt PE. Patient's left chest wall pain is reducible on exam and she has no shortness of breath while in the ED today. The patient is safe for follow-up as an outpatient. Patient is hemodynamically stable and ready for discharge.  Case discussed with Dr. Broadus John who agrees with treatment plan.     Lester Kinsman [place], PA-C 04/30/15 1905  Arby Barrette, MD 05/01/15 [phone]

## 2015-04-30 NOTE — Telephone Encounter (Addendum)
Return call to patient. Audibly short of breath over the phone. Patient states this is better than yesterday but she still has pain taking a deep breath. Advised she needs evaluation at ED as recommended earlier today. Advised if she sees cardiologist, we would want them to evaluate her, not just do an EKG. Patient states she called pulmonology but since has been 3 years, has to be a new patient to be seen. Advised again that she needs evalution in ED which patient continues to refuse, states she doesn't want to get the flu, doesn't want to sit and wait 5-6 hours. Advised she could have life threatening condition, wait time and flu are not primary concerns and again I am strongly encouraging  her to seek treatment at ED. Patient agrees to call cardiology back and get their recommendation.  Patient is asking Dr Rondel BatonMiller's instruction on what to do for endometriosis if she cant take the Yasmin? Advised will check on this but that SOB needs to be primary concern.

## 2015-04-30 NOTE — Telephone Encounter (Signed)
Patient called and said, "I need to speak with the nurse as soon as possible. I am having trouble breathing and I think it is due to a new medication I started."  Transferred call to triage nurse.

## 2015-04-30 NOTE — ED Notes (Signed)
C/o SOB x yesterday-denies pain except with dee breath-NAD-speaking in full sentences w/o resp distress

## 2015-04-30 NOTE — Telephone Encounter (Signed)
Call to patient, left message to call back. 

## 2015-04-30 NOTE — Discharge Instructions (Signed)
Chest Wall Pain Chest wall pain is pain in or around the bones and muscles of your chest. Sometimes, an injury causes this pain. Sometimes, the cause may not be known. This pain may take several weeks or longer to get better. HOME CARE INSTRUCTIONS  Pay attention to any changes in your symptoms. Take these actions to help with your pain:   Rest as told by your health care provider.   Avoid activities that cause pain. These include any activities that use your chest muscles or your abdominal and side muscles to lift heavy items.   If directed, apply ice to the painful area:  Put ice in a plastic bag.  Place a towel between your skin and the bag.  Leave the ice on for 20 minutes, 2-3 times per day.  Take over-the-counter and prescription medicines only as told by your health care provider.  Do not use tobacco products, including cigarettes, chewing tobacco, and e-cigarettes. If you need help quitting, ask your health care provider.  Keep all follow-up visits as told by your health care provider. This is important. SEEK MEDICAL CARE IF:  You have a fever.  Your chest pain becomes worse.  You have new symptoms. SEEK IMMEDIATE MEDICAL CARE IF:  You have nausea or vomiting.  You feel sweaty or light-headed.  You have a cough with phlegm (sputum) or you cough up blood.  You develop shortness of breath.   This information is not intended to replace advice given to you by your health care provider. Make sure you discuss any questions you have with your health care provider.   Follow-up with your cardiologist if your symptoms return. You may also want to schedule to have your stress test done if symptoms return. Take ibuprofen as needed for pain. Return to the emergency department a few experience severe worsening of your symptoms, difficulty breathing, vomiting, dizziness, loss of consciousness, blurry vision, numbness or tingling in your extremities.

## 2015-05-01 ENCOUNTER — Ambulatory Visit: Payer: 59 | Admitting: Physician Assistant

## 2015-05-01 ENCOUNTER — Telehealth: Payer: Self-pay | Admitting: *Deleted

## 2015-05-01 ENCOUNTER — Other Ambulatory Visit: Payer: Self-pay | Admitting: Obstetrics & Gynecology

## 2015-05-01 MED ORDER — DROSPIRENONE-ETHINYL ESTRADIOL 3-0.02 MG PO TABS: 1.0000 | ORAL_TABLET | Freq: Every day | ORAL | Status: DC

## 2015-05-01 NOTE — Telephone Encounter (Signed)
Patient calls to provide update from ED visit yesterday. States all testing was negative for any signs of blood clots. She reports she feels much better today, SOB resolved and she sharp pain on inspiration is only on occasion with very deep breath. No audible SOB like was noted yesterday. Patient states she is now concerned about her endometriosis and bleeding. Wants to restart pills. Advised she needs office visit to discuss this with Dr Hyacinth MeekerMiller.  Patient persistently declines office visit due to works 10-7 on Thursday. Offered 830 which patient declines due to her inability to get up due to sleep apnea. Offered 1000 so missing minimal work.  Patient insists Dr Hyacinth MeekerMiller manage this over phone. Patient then states she could just restart pills now. Has only missed two days. Discussed that missing pills will likely cause BTB, cannot say how hweavy or light that will be. Strongly recommend she not restart pills until Dr Hyacinth MeekerMiller is able to review records, and preferably evaluate patient with office visit. Patient states she also wants to switch back to Yaz instead of Yasmin because she just doesn't feel as well on Yasmin and never had feels this SOB is related to the change. Advised will review with Dr Hyacinth MeekerMiller and call her back as soon as possible but due to time of day, this may not be today.

## 2015-05-01 NOTE — Telephone Encounter (Signed)
I've reviewed ER note.   Pt requested switch from Yaz (which she'd been on for years) to Yasmin due to cost and suggestion from pharmacy.  This was okayed by Dr. Edward JollySilva and then pt wanted my opinion.  She now desires to be back on Yaz.  Rx sent to pharmacy.  Thanks.  Ok to close encounter.

## 2015-05-02 NOTE — Telephone Encounter (Signed)
Dr Hyacinth MeekerMiller spoke to patient directly. Encounter closed.

## 2015-05-03 ENCOUNTER — Telehealth: Payer: Self-pay | Admitting: Obstetrics & Gynecology

## 2015-05-03 NOTE — Telephone Encounter (Signed)
Spoke with patient. Patient states that she was previously taking Yasmin and missed her pills on 4/3 and 4/4. Spoke with Dr.Miller on 4/4 about changing her OCP to Yaz (please see telephone encounter dated 05/01/2015). States she was advised by Dr.Miller to start Yaz on 4/4. Patient was unable to pick up her birth control until 634/5. States she begin to have increased bleeding on 05/02/2015. States she has changed her pad/tampon once today since waking up. Is changing pad/tampon every 4-5 hours. States her bleeding is more than her normal cycle. Patient is asking how she should start her new OCP since she has started bleeding. Advised I will speak with Dr.Miller and return call with further recommendations. She is agreeable.

## 2015-05-03 NOTE — Telephone Encounter (Signed)
Left message to call Addley Ballinger at [phone]. 

## 2015-05-03 NOTE — Telephone Encounter (Signed)
She can start her pills now.  That is fine to start now.

## 2015-05-03 NOTE — Telephone Encounter (Signed)
Patient wants to talk with the nurse. She wants to discuss when she should start back on a medication. She states she can not be reached for the next 3 hours. Please call her at [phone]

## 2015-05-03 NOTE — Telephone Encounter (Signed)
Call to patient and she is advised of message from Dr. Hyacinth MeekerMiller. She is agreeable. Will restart her pills today.  She is advised to call back with any increase in bleeding, heavy bleeding or any concerns and patient is agreeable.  Routing to provider for final review. Patient agreeable to disposition. Will close encounter.

## 2015-05-03 NOTE — Telephone Encounter (Signed)
Patient calling to check on status of call. She is aware a nurse will call her back once she speaks with Dr. Hyacinth MeekerMiller on her behalf.

## 2015-10-23 ENCOUNTER — Telehealth: Payer: Self-pay | Admitting: Pulmonary Disease

## 2015-10-23 NOTE — Telephone Encounter (Signed)
Spoke with pt and advised that since she has not been seen in our office in >2779yrs we would not be able to write letter at this time. Pt is requesting letter to state that she cannot work hours from 8am-5pm due to her "sleep condition". Pt is now working from 10am-7pm and states that this works better for her. Advised pt that since we have not seen her in so long we need to get more information as to how she has been doing, her CPAP compliance and her daytime sleepiness. Pt states that she no longer uses CPAP machine and has not for several years. Pt was advised that she would need to re-establish care in order for consideration of letter. Pt was told she can call daily for cancellation if she would like, but needs to specify it be made with a sleep dr. Pt voiced understanding. Nothing further needed at this time.

## 2015-11-02 DIAGNOSIS — F422 Mixed obsessional thoughts and acts: Secondary | ICD-10-CM | POA: Insufficient documentation

## 2015-11-02 DIAGNOSIS — L659 Nonscarring hair loss, unspecified: Secondary | ICD-10-CM | POA: Insufficient documentation

## 2015-11-16 ENCOUNTER — Other Ambulatory Visit: Payer: Self-pay | Admitting: Obstetrics & Gynecology

## 2015-11-16 NOTE — Telephone Encounter (Signed)
Medication refill request: YAZ 3-00.02mg  Last AEX:  09/15/14 SM Next AEX: 01/04/16 Last MMG (if hormonal medication request): 01/19/15 [id number] negative Refill authorized: 05/01/15 #1pack w/6 refills; today please advise

## 2015-11-19 ENCOUNTER — Encounter: Payer: Self-pay | Admitting: Pulmonary Disease

## 2015-11-19 ENCOUNTER — Ambulatory Visit (INDEPENDENT_AMBULATORY_CARE_PROVIDER_SITE_OTHER): Payer: 59 | Admitting: Pulmonary Disease

## 2015-11-19 DIAGNOSIS — G4710 Hypersomnia, unspecified: Secondary | ICD-10-CM | POA: Diagnosis not present

## 2015-11-19 DIAGNOSIS — G4733 Obstructive sleep apnea (adult) (pediatric): Secondary | ICD-10-CM

## 2015-11-19 MED ORDER — AMBULATORY NON FORMULARY MEDICATION: 0 refills | Status: DC

## 2015-11-19 NOTE — Assessment & Plan Note (Addendum)
Letter for work Prescription for provent EPAP 5 cm Cautioned against driving why from a day-sis  she been diagnosed with moderate obstructive sleep apnea. She has been unable to tolerate CPAP therapy or dental appliance . She has residual sleepiness in the morning and is unable to work the early morning shift. Please assist her in whatever manner possible.

## 2015-11-19 NOTE — Progress Notes (Signed)
Subjective:    Patient ID: Frances Ho, female    DOB: [birth year], 45 y.o.   MRN: [id number]  HPI  Chief Complaint  Patient presents with  . sleep consult    former [facility] pt. last worn cpap 2 years. pt c/o daytime sleepiness, restless sleep & trouble falling alseep. EPWORTH:36    45 year old woman presents for evaluation of somnolence. She has been diagnosed with  Moderate OSA in 2012 by an overnight polysomnogram. This was corrected byCPAP of 9 cm. However she was unable to tolerate  CPAP instead of trying multiple mask interfaces. She was seenBy an Associate Professor and fitted with a dental appliance but this does not improve her symptoms either She has seen 2 of my partners in the past. She was also evaluated at [facility] and underwent an MSL T which showed a mean sleep latency of 10 minutes with no sleep onset REM .  She is also undergoing psychiatric evaluation for OCD-has not tolerated Wellbutrin, was also given benzo diazepines which she has not used due to fear of side effects.   Her current issue is that she needs a note for work. She used to work from 10 AM to 7 PM,but her supervisor is now insisting that she work from 8 AM to 5 PM. She reports difficulty getting to work in the morning. She has tried to go to bed around midnight, sleep latency is variable, reports one to 3 nocturnal awakenings, denies snoring or gasping episodes, she tries to get out of bed by 6:30 AM still feeling tired and sleepy. She used to to 3 alarm clocks and was still not able to wake up  On weekends she stays in bed late but still remains sleepy  Epworth sleepiness score is 5  She denies problems driving  Significant tests/ events  09/2010 PSG >>AHI 0, RDI 17.7, SpO2 low 95%.   Past Medical History:  Diagnosis Date  . Anxiety   . Chronic headaches   . Depression   . Endometriosis   . Genital warts   . Heart murmur   . Mitral valve prolapse   . OSA (obstructive sleep apnea) 07/30/2010  .  Tachycardia      Past Surgical History:  Procedure Laterality Date  . COLPOSCOPY  3/09   w/ECC-neg  . FOOT SURGERY  2005   left  . ovarian cyst removed    . PELVIC LAPAROSCOPY Bilateral 11/13/03   laparoscopic removal bilateral endometriomas     No Known Allergies   Social History   Social History  . Marital status: Single    Spouse name: N/A  . Number of children: 0  . 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 Smoking status: Never Smoker  . Smokeless tobacco: Never Used  . Alcohol use No  . Drug use: No  . Sexual activity: Not on file     Comment: yaz   Other Topics Concern  . Not on file   Social History Narrative   Pt is single and lives with family.      Family History  Problem Relation Age of Onset  . Diabetes      grandfather  . Hypertension      grandfather/grandmother  . Heart disease Maternal Grandmother   . Heart disease Maternal Grandfather   . Heart disease Paternal Grandmother   . Heart disease Paternal Grandfather   . Skin cancer Mother  Review of Systems  Constitutional: Negative for fever and unexpected weight change.  HENT: Positive for sinus pressure. Negative for congestion, dental problem, ear pain, nosebleeds, postnasal drip, rhinorrhea, sneezing, sore throat and trouble swallowing.   Eyes: Positive for redness. Negative for itching.  Respiratory: Negative for cough, chest tightness, shortness of breath and wheezing.   Cardiovascular: Negative for palpitations and leg swelling.  Gastrointestinal: Negative for nausea and vomiting.  Genitourinary: Negative for dysuria.  Musculoskeletal: Negative for joint swelling.  Skin: Negative for rash.  Neurological: Negative for headaches.  Hematological: Does not bruise/bleed easily.  Psychiatric/Behavioral: Negative for dysphoric mood. The patient is not nervous/anxious.        Objective:   Physical  Exam  Gen. Pleasant,  in no distress, normal affect ENT - no lesions, no post nasal drip, class 2 airway Neck: No JVD, no thyromegaly, no carotid bruits Lungs: no use of accessory muscles, no dullness to percussion, decreased without rales or rhonchi  Cardiovascular: Rhythm regular, heart sounds  normal, no murmurs or gallops, no peripheral edema Abdomen: soft and non-tender, no hepatosplenomegaly, BS normal. Musculoskeletal: No deformities, no cyanosis or clubbing Neuro:  alert, non focal, no tremors       Assessment & Plan:

## 2015-11-19 NOTE — Patient Instructions (Signed)
Letter for work Prescription for provent EPAP 5 cm   Rules of sleep hygiene were discussed  - light exercise -avoid caffeinated beverages - no more than 20 mins staying awake in bed, if not asleep, get out of bed & reading or light music - No TV or computer games at bedtime. -Sunlight exposure for 30 minutes in the morning   Trial of melatonin 3-5 mg around 10 PM

## 2015-11-19 NOTE — Addendum Note (Signed)
Addended by: Maxwell MarionBLANKENSHIP, Hopelynn Gartland A on: 11/19/2015 04:52 PM   Modules accepted: Orders

## 2015-11-19 NOTE — Assessment & Plan Note (Addendum)
Rules of sleep hygiene were discussed  - light exercise -avoid caffeinated beverages - no more than 20 mins staying awake in bed, if not asleep, get out of bed & reading or light music - No TV or computer games at bedtime. -Sunlight exposure for 30 minutes in the morning   Trial of melatonin 3-5 mg around 10 PM  She will be asked to keep a leep diary and and return in 3 months

## 2015-11-22 ENCOUNTER — Encounter (HOSPITAL_COMMUNITY): Payer: Self-pay | Admitting: Psychiatry

## 2015-11-22 ENCOUNTER — Ambulatory Visit (INDEPENDENT_AMBULATORY_CARE_PROVIDER_SITE_OTHER): Payer: 59 | Admitting: Psychiatry

## 2015-11-22 VITALS — BP 114/66 | HR 95 | Resp 16 | Ht 62.0 in | Wt 149.0 lb

## 2015-11-22 DIAGNOSIS — F5102 Adjustment insomnia: Secondary | ICD-10-CM

## 2015-11-22 DIAGNOSIS — Z8249 Family history of ischemic heart disease and other diseases of the circulatory system: Secondary | ICD-10-CM

## 2015-11-22 DIAGNOSIS — Z833 Family history of diabetes mellitus: Secondary | ICD-10-CM

## 2015-11-22 DIAGNOSIS — F429 Obsessive-compulsive disorder, unspecified: Secondary | ICD-10-CM

## 2015-11-22 DIAGNOSIS — F411 Generalized anxiety disorder: Secondary | ICD-10-CM | POA: Diagnosis not present

## 2015-11-22 DIAGNOSIS — Z79899 Other long term (current) drug therapy: Secondary | ICD-10-CM

## 2015-11-22 MED ORDER — FLUOXETINE HCL 10 MG PO TABS: 20.0000 mg | ORAL_TABLET | Freq: Every day | ORAL | 0 refills | Status: DC

## 2015-11-22 NOTE — Progress Notes (Signed)
Psychiatric Initial Adult Assessment   Patient Identification: Frances Ho MRN:  [id number] Date of Evaluation:  11/22/2015 Referral Source: Primary care Chief Complaint:   Chief Complaint    Establish Care     Visit Diagnosis:    ICD-9-CM ICD-10-CM   1. Obsessive-compulsive disorder, unspecified type 300.3 F42.9   2. GAD (generalized anxiety disorder) 300.02 F41.1   3. Adjustment insomnia 307.41 F51.02     History of Present Illness:  45 years old currently single Caucasian female who is living with her mom. Referred by primary care physician for management of sleep and OCD  Patient is seen a psychiatrist in [place] for the last 1 year for management of OCD apparently has started on different medications getting Wellbutrin that made her chest hurt Celexa made her yawn of the medication made her tinnitus She is having difficulty completing tasks she spends hours checking on things rechecking make sure everything is fine organizing things she has to spend 2 or 3 hours extra On top of that now she is working morning she she cannot wake  up in the morning because she has sleep apnea which is not treated because she cannot tolerate a sleep apnea machine She is having difficulty with focus during day, poor sleep and OCD is getting worse She endorses worries, excessive at times difficulty sleeping or maintaining sleep. She is endorsing some mood feeling down sadness crying spells because of stress of work she is having difficulty time coping with her work stress and the morning shift and she is at [employer] that she may lose her job or may look around  Severity of depression; 5 out of 10. 10 being no depression Severity of anxiety; 6 out of 10. 10 being extreme anxiety Does not endorse manic symptoms, psychotic symptoms or significant panic attacks    Associated Signs/Symptoms: Depression Symptoms:  insomnia, fatigue, difficulty concentrating, anxiety, loss of  energy/fatigue, (Hypo) Manic Symptoms:  Distractibility, Anxiety Symptoms:  Excessive Worry, Psychotic Symptoms:  denies PTSD Symptoms: NA  Past Psychiatric History: Denies prior to last year. No hospitalization per history  Previous Psychotropic Medications: Yes   Substance Abuse History in the last 12 months:  No.  Consequences of Substance Abuse: NA  Past Medical History:  Past Medical History:  Diagnosis Date  . Anxiety   . Chronic headaches   . Depression   . Endometriosis   . Genital warts   . Heart murmur   . Mitral valve prolapse   . OSA (obstructive sleep apnea) 07/30/2010  . Tachycardia     Past Surgical History:  Procedure Laterality Date  . COLPOSCOPY  3/09   w/ECC-neg  . FOOT SURGERY  2005   left  . ovarian cyst removed    . PELVIC LAPAROSCOPY Bilateral 11/13/03   laparoscopic removal bilateral endometriomas    Family Psychiatric History: Mom : depression and anxiety  Family History:  Family History  Problem Relation Age of Onset  . Diabetes      grandfather  . Hypertension      grandfather/grandmother  . Heart disease Maternal Grandmother   . Heart disease Maternal Grandfather   . Heart disease Paternal Grandmother   . Heart disease Paternal Grandfather   . Skin cancer Mother     Social History:   Social History   Social History  . Marital status: Single    Spouse name: N/A  . Number of children: 0  . Years of education: N/A   Occupational History  .  clerical at [employer]    Social History Main Topics  . Smoking status: Never Smoker  . Smokeless tobacco: Never Used  . Alcohol use No  . Drug use: No  . Sexual activity: Not Asked     Comment: yaz   Other Topics Concern  . None   Social History Narrative   Pt is single and lives with family.     Additional Social History: Patient grew up with her mom is okay growing up but she described herself to be a shy and anxious person did not have too many friends she finished high  school  Patient is working as a Warehouse managerclerical job full-time does not have any kids she is not married no legal issues  Allergies:  No Known Allergies  Metabolic Disorder Labs: No results found for: HGBA1C, MPG No results found for: PROLACTIN Lab Results  Component Value Date   CHOL 209 (H) 09/15/2014   TRIG 152 (H) 09/15/2014   HDL 64 09/15/2014   CHOLHDL 3.3 09/15/2014   VLDL 30 09/15/2014   LDLCALC 115 09/15/2014   LDLCALC 120 (H) 08/09/2013     Current Medications: Current Outpatient Prescriptions  Medication Sig Dispense Refill  . Biotin (BIOTIN 5000) 5 MG CAPS Take 1 capsule by mouth daily.    . fluorometholone (FML) 0.1 % ophthalmic suspension Place 1 drop into both eyes daily as needed (DRY EYES).   1  . folic acid (FOLVITE) 800 MCG tablet Take 800 mcg by mouth daily.      Marland Kitchen. GLUCOSA-CHONDR-NA CHONDR-MSM PO Take 1,500 mg by mouth daily.    . Methylsulfonylmethane (MSM) 1000 MG CAPS Take 1 capsule by mouth daily.      . Multiple Vitamin (MULTIVITAMIN) tablet Take 1 tablet by mouth daily.     . vitamin E 400 UNIT capsule Take 400 Units by mouth daily.      Marland Kitchen. YAZ 3-0.02 MG tablet TAKE 1 TABLET BY MOUTH DAILY. 28 tablet 3  . clonazePAM (KLONOPIN) 0.5 MG tablet     . FLUoxetine (PROZAC) 10 MG tablet Take 2 tablets (20 mg total) by mouth daily. Start with 10mg  a day increase to 2 a day in 5 days. 60 tablet 0  . LORazepam (ATIVAN) 0.5 MG tablet Take 0.5 mg by mouth.     No current facility-administered medications for this visit.     Neurologic: Headache: No Seizure: No Paresthesias:No  Musculoskeletal: Strength & Muscle Tone: within normal limits Gait & Station: normal Patient leans: no lean  Psychiatric Specialty Exam: Review of Systems  Cardiovascular: Negative for chest pain.  Musculoskeletal: Negative for neck pain.  Skin: Negative for rash.  Psychiatric/Behavioral: Negative for substance abuse and suicidal ideas. The patient is nervous/anxious.     Blood  pressure 114/66, pulse 95, resp. rate 16, height 5\' 2"  (1.575 m), weight 149 lb (67.6 kg), SpO2 97 %.Body mass index is 27.25 kg/m.  General Appearance: Casual  Eye Contact:  Fair  Speech:  Normal Rate  Volume:  Decreased  Mood:  Anxious and Depressed  Affect:  Constricted  Thought Process:  Goal Directed  Orientation:  Full (Time, Place, and Person)  Thought Content:  Rumination  Suicidal Thoughts:  No  Homicidal Thoughts:  No  Memory:  Immediate;   Fair Recent;   Fair  Judgement:  Fair  Insight:  Fair  Psychomotor Activity:  Decreased  Concentration:  Concentration: Fair and Attention Span: Fair  Recall:  [employer]Fair  Fund of Knowledge:Fair  Language: Fair  Akathisia:  Negative  Handed:  Right  AIMS (if indicated):    Assets:  Desire for Improvement  ADL's:  Intact  Cognition: WNL  Sleep:  poor    Treatment Plan Summary: Medication management and Plan as follows   OCD: will Start Prozac 10 mg increased to 20 mg in 5 days and then 30 minute grandma will see her back in 3 weeks and consider increase the medication if she is able tolerate it she understands higher dose but he needed to manage it For now augmented Klonopin 0.5 mg half tab at night to help with sleep also to keep her head elevated and work on getting a sleep apnea machine Generalized anxiety disorder; Prozac and Klonopin as above Insomnia; review sleep hygiene patient ate to get another assessment to use a different mask so that her sleep apnea as well as treated as it may be a inability affecting her performance and anxiety during the day Will write down letter to avoid morning shift as she is not having enough hours and quality of sleep Recommend psychotherapy to deal with her OCD-like symptoms and increase coping skills More than 50% time spent in counseling and coordination of care including patient education Call 911 or report local emergency room for any urgent concerns or suicidal thoughts   Shanera Meske,  MD 10/26/201711:55 AM

## 2015-11-22 NOTE — Patient Instructions (Signed)
Start prozac 10mg  increase to 20mg  in 5 days and then 30mg  in 10 days.  Take klonopine half to one tablet at night.  Work on getting sleep apnea controlled

## 2015-12-05 ENCOUNTER — Telehealth: Payer: Self-pay | Admitting: Pulmonary Disease

## 2015-12-05 ENCOUNTER — Encounter: Payer: Self-pay | Admitting: *Deleted

## 2015-12-05 NOTE — Telephone Encounter (Signed)
Okay to give same letter dated 12/07/15 Please note date of her office visit in the letter

## 2015-12-05 NOTE — Telephone Encounter (Signed)
Spoke with pt who states she did not turn the letter in that she was given at her 11-19-15 OV with RA. Pt states she did not turn this letter in due to wanting to get ahead on her work first, because pt is afraid that once her employer receives the letter they will then start to look for other reasons to get rid of her. Pt has requested a new letter dated for 12-07-15.  RA please advise. Thanks.

## 2015-12-05 NOTE — Telephone Encounter (Signed)
Called and spoke with pt and she is aware that a new letter has been completed and placed in the mail to her. Nothing further is needed.

## 2015-12-10 ENCOUNTER — Telehealth: Payer: Self-pay | Admitting: Pulmonary Disease

## 2015-12-10 ENCOUNTER — Encounter: Payer: Self-pay | Admitting: Emergency Medicine

## 2015-12-10 ENCOUNTER — Encounter: Payer: Self-pay | Admitting: *Deleted

## 2015-12-10 NOTE — Telephone Encounter (Signed)
Called and spoke to pt. Pt states she still has not received the letter that was mailed out on 12/05/15 and needs this today for her job, also date of letter was to read 12/07/15 instead of 12/05/15. Changed date and placed letter up front for pick up. Pt verbalized understanding and denied any further questions or concerns at this time.

## 2015-12-10 NOTE — Telephone Encounter (Signed)
Spoke with pt. She was very rude and uncooperative on the phone. Advised her that I would be happy to fax this letter to her, she very rudely declined. She rudely asked that we figure out how to send this letter through MyChart. I have copy and pasted the letter that was drafted this morning by Robynn PaneElise. I have attempted to send this through MyChart. I advised the pt that I could not guarantee that this letter will be available through MyChart. Pt will call us back if she can't access this letter.

## 2015-12-10 NOTE — Telephone Encounter (Signed)
Spoke with patient-states the letter needs to be dated 12-07-15 (no other date) as well as a signature from RA. Pt is aware that we can not Mychart a letter with MD's signature to her-the letter would need to be picked up. Pt will have Bonita QuinLinda Mitter come by the office around lunch time or after to pick up letter.    RA please advise if you are okay with changing date on letter for patient. Thanks .

## 2015-12-11 NOTE — Telephone Encounter (Signed)
Letter printed and given to Dr Vassie LollAlva to sign.  [facility] make sure that patient is called once letter is signed and ready to pick up.

## 2015-12-11 NOTE — Telephone Encounter (Signed)
ok 

## 2015-12-11 NOTE — Telephone Encounter (Signed)
Please advise Dr Vassie LollAlva if you are okay with us changing the date on the letter and you resigning this letter. Letter is in EPIC (under letters tab) dated for 12/07/15. Pt would like to pick this up by noon. Thanks.

## 2015-12-11 NOTE — Telephone Encounter (Signed)
Pt calling stating that she needs to speak to nurse before letter is signed by dr. She can be reached @ [phone].Caren GriffinsStanley A Dalton

## 2015-12-11 NOTE — Telephone Encounter (Signed)
LMOM stating that the letter is ready to be picked up

## 2015-12-14 NOTE — Telephone Encounter (Signed)
lmomtcb x 2  

## 2015-12-14 NOTE — Telephone Encounter (Signed)
Patient returned phone call, stated picked up letter already, contact # [phone].Charm Rings.Erica R Taylor

## 2015-12-14 NOTE — Telephone Encounter (Signed)
Spoke with pt. She has already picked up the letter she needed. Nothing further was needed.

## 2015-12-27 ENCOUNTER — Encounter (HOSPITAL_COMMUNITY): Payer: Self-pay | Admitting: Psychiatry

## 2015-12-27 ENCOUNTER — Ambulatory Visit (INDEPENDENT_AMBULATORY_CARE_PROVIDER_SITE_OTHER): Payer: 59 | Admitting: Psychiatry

## 2015-12-27 VITALS — BP 124/80 | HR 84 | Resp 16 | Ht 62.0 in | Wt 146.0 lb

## 2015-12-27 DIAGNOSIS — F411 Generalized anxiety disorder: Secondary | ICD-10-CM | POA: Diagnosis not present

## 2015-12-27 DIAGNOSIS — Z833 Family history of diabetes mellitus: Secondary | ICD-10-CM

## 2015-12-27 DIAGNOSIS — F429 Obsessive-compulsive disorder, unspecified: Secondary | ICD-10-CM | POA: Diagnosis not present

## 2015-12-27 DIAGNOSIS — Z8249 Family history of ischemic heart disease and other diseases of the circulatory system: Secondary | ICD-10-CM

## 2015-12-27 DIAGNOSIS — Z9889 Other specified postprocedural states: Secondary | ICD-10-CM | POA: Diagnosis not present

## 2015-12-27 DIAGNOSIS — Z808 Family history of malignant neoplasm of other organs or systems: Secondary | ICD-10-CM

## 2015-12-27 DIAGNOSIS — F5102 Adjustment insomnia: Secondary | ICD-10-CM | POA: Diagnosis not present

## 2015-12-27 DIAGNOSIS — Z79899 Other long term (current) drug therapy: Secondary | ICD-10-CM

## 2015-12-27 MED ORDER — FLUOXETINE HCL 20 MG PO TABS: 20.0000 mg | ORAL_TABLET | Freq: Every day | ORAL | 0 refills | Status: DC

## 2015-12-27 NOTE — Progress Notes (Signed)
[facility]   Patient Identification: Frances ProwsLabrina M Ho MRN:  [id number] Date of Evaluation:  12/27/2015 Referral Source: Primary care Chief Complaint:   Chief Complaint    Follow-up     Visit Diagnosis:  OCD GAD Insomnia History of Present Illness:  45 years old currently single Caucasian female who is living with her mom. Referred by primary care physician for management of sleep and OCD  [facility] Last visit we started on Prozac and increase to 20 mg and she still has significant delay in completing work because of recurrent session and also at nighttime she checks on the clock again and again for half an hour. States Prozac has helped only minimal In regarding her anxiety disorder we added Klonopin but for some reason she did not started and she said she wants to be on minimal medications. She still endorses worries excessively and also dwells on compulsions that work Insomnia; she also sleep apnea but cannot tolerate a sleep apnea machine. 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 10 being no anxiety  Aggravating factors is recurrent obsessions. Job stress  Severity of anxiety; 6 out of 10. 10 being extreme anxiety Does not endorse manic symptoms, psychotic symptoms or significant panic attacks   Past Medical History:  Past Medical History:  Diagnosis Date  . Anxiety   . Chronic headaches   . Depression   . Endometriosis   . Genital warts   . Heart murmur   . Mitral valve prolapse   . OSA (obstructive sleep apnea) 07/30/2010  . Tachycardia     Past Surgical History:  Procedure Laterality Date  . COLPOSCOPY  3/09   w/ECC-neg  . FOOT SURGERY  2005   left  . ovarian cyst removed    . PELVIC LAPAROSCOPY Bilateral 11/13/03   laparoscopic removal  bilateral endometriomas    Family Psychiatric History: Mom : depression and anxiety  Family History:  Family History  Problem Relation Age of Onset  . Skin cancer Mother   . Diabetes      grandfather  . Hypertension      grandfather/grandmother  . Heart disease Maternal Grandmother   . Heart disease Maternal Grandfather   . Heart disease Paternal Grandmother   . Heart disease Paternal Grandfather     Social History:   Social History   Social History  . Marital status: Single    Spouse name: N/A  . Number of children: 0  . 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 Smoking status: Never Smoker  . Smokeless tobacco: Never Used  . Alcohol use No  . Drug use: No  . Sexual activity: Not Asked     Comment: yaz   Other Topics Concern  . None   Social History Narrative   Pt is single and lives with family.      Allergies:  No Known Allergies  Metabolic Disorder Labs: No results found for: HGBA1C, MPG No results found for: PROLACTIN Lab Results  Component Value Date   CHOL 209 (H) 09/15/2014   TRIG 152 (H) 09/15/2014   HDL 64 09/15/2014   CHOLHDL 3.3 09/15/2014  VLDL 30 09/15/2014   LDLCALC 115 09/15/2014   LDLCALC 120 (H) 08/09/2013     Current Medications: Current Outpatient Prescriptions  Medication Sig Dispense Refill  . Biotin (BIOTIN 5000) 5 MG CAPS Take 1 capsule by mouth daily.    . fluorometholone (FML) 0.1 % ophthalmic suspension Place 1 drop into both eyes daily as needed (DRY EYES).   1  . FLUoxetine (PROZAC) 10 MG tablet Take 2 tablets (20 mg total) by mouth daily. Start with 10mg  a day increase to 2 a day in 5 days. 60 tablet 0  . folic acid (FOLVITE) 800 MCG tablet Take 800 mcg by mouth daily.      Marland Kitchen. GLUCOSA-CHONDR-NA CHONDR-MSM PO Take 1,500 mg by mouth daily.    . Methylsulfonylmethane (MSM) 1000 MG CAPS Take 1 capsule by mouth daily.      . Multiple Vitamin  (MULTIVITAMIN) tablet Take 1 tablet by mouth daily.     . vitamin E 400 UNIT capsule Take 400 Units by mouth daily.      Marland Kitchen. YAZ 3-0.02 MG tablet TAKE 1 TABLET BY MOUTH DAILY. 28 tablet 3   No current facility-administered medications for this visit.       Psychiatric Specialty Exam: Review of Systems  Cardiovascular: Negative for palpitations.  Gastrointestinal: Negative for nausea.  Musculoskeletal: Negative for neck pain.  Skin: Negative for itching.  Psychiatric/Behavioral: Positive for depression. Negative for substance abuse and suicidal ideas. The patient is nervous/anxious.     Blood pressure 124/80, pulse 84, resp. rate 16, height 5\' 2"  (1.575 m), weight 146 lb (66.2 kg), SpO2 99 %.Body mass index is 26.7 kg/m.  General Appearance: Casual  Eye Contact:  Fair  Speech:  Normal Rate  Volume:  Decreased  Mood:  anxious  Affect:  Constricted  Thought Process:  Goal Directed  Orientation:  Full (Time, Place, and Person)  Thought Content:  Rumination  Suicidal Thoughts:  No  Homicidal Thoughts:  No  Memory:  Immediate;   Fair Recent;   Fair  Judgement:  Fair  Insight:  Fair  Psychomotor Activity:  Decreased  Concentration:  Concentration: Fair and Attention Span: Fair  Recall:  [employer]Fair  Fund of Knowledge:Fair  Language: Fair  Akathisia:  Negative  Handed:  Right  AIMS (if indicated):    Assets:  Desire for Improvement  ADL's:  Intact  Cognition: WNL  Sleep:  poor    Treatment Plan Summary: Medication management and Plan as follows  OCD: she does not want to increase prozac for concern of tinnitis. Will keep same dose Recommend to start klonopine as discussed last visit to work for anxiety and enhance effect of prozac  GAD: prozac 20mg  sent. klonopine to be used as reviewed Insomnia; she struggles with cpap cannot use. Reviewed sleep hygiene.  klonopine at night may hellp.   FU in 4-5 weeks. Will work on changing to cymbalta if she struggles with side effect and  also if needed a stimulant med for any possiblity of concentration difficulties once anxiety is managed.     Thresa RossAKHTAR, Veniamin Kincaid, MD 11/30/20173:45 PM

## 2016-01-03 NOTE — Progress Notes (Signed)
45 y.o. G0P0 SingleCaucasianF here for annual exam.  Doing well.  Doesn't like work but happy with having a job.  Cycles are normal.  Having more leg pain this year that is associated with her cycle.  Typically this is associated with her cycle.  Patient's last menstrual period was 12/20/2015.          Sexually active: No.  The current method of family planning is OCP (estrogen/progesterone).    Exercising: Yes.    bike pedals Smoker:  no  Health Maintenance: Pap:  09/15/14 Neg  History of abnormal Pap:  Yes, 2009  MMG:  01/23/15 BIRADS1:neg  Colonoscopy:  Never BMD:   Never TDaP:  03/2008  Hep C testing: not indicated Screening Labs: obtained today, Hb today: obtained today, Urine today: pending   reports that she has never smoked. She has never used smokeless tobacco. She reports that she does not drink alcohol or use drugs.  Past Medical History:  Diagnosis Date  . Anxiety   . Chronic headaches   . Depression   . Endometriosis   . Genital warts   . Heart murmur   . Mitral valve prolapse   . OSA (obstructive sleep apnea) 07/30/2010  . Tachycardia     Past Surgical History:  Procedure Laterality Date  . COLPOSCOPY  3/09   w/ECC-neg  . FOOT SURGERY  2005   left  . ovarian cyst removed    . PELVIC LAPAROSCOPY Bilateral 11/13/03   laparoscopic removal bilateral endometriomas    Current Outpatient Prescriptions  Medication Sig Dispense Refill  . Biotin (BIOTIN 5000) 5 MG CAPS Take 1 capsule by mouth daily.    . clonazePAM (KLONOPIN) 0.5 MG tablet Take 0.5 mg by mouth daily. Take a 1/2 tablet once daily    . fluorometholone (FML) 0.1 % ophthalmic suspension Place 1 drop into both eyes daily as needed (DRY EYES).   1  . FLUoxetine (PROZAC) 20 MG tablet Take 1 tablet (20 mg total) by mouth daily. Fill when due. 30 tablet 0  . folic acid (FOLVITE) 800 MCG tablet Take 800 mcg by mouth daily.      Marland Kitchen. GLUCOSA-CHONDR-NA CHONDR-MSM PO Take 1,500 mg by mouth daily.    .  Methylsulfonylmethane (MSM) 1000 MG CAPS Take 1 capsule by mouth daily.      . Multiple Vitamin (MULTIVITAMIN) tablet Take 1 tablet by mouth daily.     . vitamin E 400 UNIT capsule Take 400 Units by mouth daily.      Marland Kitchen. YAZ 3-0.02 MG tablet TAKE 1 TABLET BY MOUTH DAILY. 28 tablet 3   No current facility-administered medications for this visit.     Family History  Problem Relation Age of Onset  . Skin cancer Mother   . Diabetes      grandfather  . Hypertension      grandfather/grandmother  . Heart disease Maternal Grandmother   . Heart disease Maternal Grandfather   . Heart disease Paternal Grandmother   . Heart disease Paternal Grandfather     ROS:  Pertinent items are noted in HPI.  Otherwise, a comprehensive ROS was negative.  Exam:   BP 112/72 (BP Location: Right Arm, Patient Position: Sitting, Cuff Size: Normal)   Pulse 84   Resp 16   Ht 5\' 2"  (1.575 m)   Wt 147 lb (66.7 kg)   LMP 12/20/2015   BMI 26.89 kg/m   Weight change: -3#   Height: 5\' 2"  (157.5 cm)  Ht Readings from  Last 3 Encounters:  01/04/16 5\' 2"  (1.575 m)  11/19/15 5\' 2"  (1.575 m)  04/30/15 5\' 2"  (1.575 m)   General appearance: alert, cooperative and appears stated age Head: Normocephalic, without obvious abnormality, atraumatic Neck: no adenopathy, supple, symmetrical, trachea midline and thyroid normal to inspection and palpation Lungs: clear to auscultation bilaterally Breasts: normal appearance, no masses or tenderness Heart: regular rate and rhythm Abdomen: soft, non-tender; bowel sounds normal; no masses,  no organomegaly Extremities: extremities normal, atraumatic, no cyanosis or edema Skin: Skin color, texture, turgor normal. No rashes or lesions Lymph nodes: Cervical, supraclavicular, and axillary nodes normal. No abnormal inguinal nodes palpated Neurologic: Grossly normal  Pelvic: External genitalia:  no lesions              Urethra:  normal appearing urethra with no masses, tenderness or  lesions              Bartholins and Skenes: normal                 Vagina: normal appearing vagina with normal color and discharge, no lesions              Cervix: no lesions              Pap taken: Yes.   Bimanual Exam:  Uterus:  normal size, contour, position, consistency, mobility, non-tender              Adnexa: normal adnexa and no mass, fullness, tenderness               Rectovaginal: Confirms               Anus:  normal sphincter tone, no lesions  Chaperone was present for exam.  A:  Well Woman with normal exam Endometriosis, Stage IV, diagnosed with laparoscopy Depression/Anxiety H/O HR HPV with neg Pap.  Most recent HPV testing 2014 was negative. H/O genital warts On OCPs  P: Mammogram screening discussed pap smear and HR HPV obtained today Vit D, CMP, Lipids, Ferritin level Rx for Yaz.  #338mo supply/13 RF return annually or prn

## 2016-01-04 ENCOUNTER — Ambulatory Visit (INDEPENDENT_AMBULATORY_CARE_PROVIDER_SITE_OTHER): Payer: 59 | Admitting: Obstetrics & Gynecology

## 2016-01-04 ENCOUNTER — Encounter: Payer: Self-pay | Admitting: Obstetrics & Gynecology

## 2016-01-04 VITALS — BP 112/72 | HR 84 | Resp 16 | Ht 62.0 in | Wt 147.0 lb

## 2016-01-04 DIAGNOSIS — Z01419 Encounter for gynecological examination (general) (routine) without abnormal findings: Secondary | ICD-10-CM

## 2016-01-04 DIAGNOSIS — Z0000 Encounter for general adult medical examination without abnormal findings: Secondary | ICD-10-CM

## 2016-01-04 DIAGNOSIS — R5383 Other fatigue: Secondary | ICD-10-CM | POA: Diagnosis not present

## 2016-01-04 DIAGNOSIS — Z124 Encounter for screening for malignant neoplasm of cervix: Secondary | ICD-10-CM

## 2016-01-04 LAB — COMPREHENSIVE METABOLIC PANEL
ALT: 16 U/L (ref 6–29)
AST: 24 U/L (ref 10–30)
Albumin: 4.1 g/dL (ref 3.6–5.1)
Alkaline Phosphatase: 65 U/L (ref 33–115)
BILIRUBIN TOTAL: 0.6 mg/dL (ref 0.2–1.2)
BUN: 11 mg/dL (ref 7–25)
CO2: 26 mmol/L (ref 20–31)
CREATININE: 0.91 mg/dL (ref 0.50–1.10)
Calcium: 8.6 mg/dL (ref 8.6–10.2)
Chloride: 102 mmol/L (ref 98–110)
GLUCOSE: 69 mg/dL (ref 65–99)
Potassium: 4.2 mmol/L (ref 3.5–5.3)
SODIUM: 138 mmol/L (ref 135–146)
Total Protein: 7 g/dL (ref 6.1–8.1)

## 2016-01-04 LAB — LIPID PANEL
Cholesterol: 212 mg/dL — ABNORMAL HIGH (ref <200)
HDL: 74 mg/dL (ref >50)
LDL CALC: 102 mg/dL — AB (ref <100)
Total CHOL/HDL Ratio: 2.9 Ratio (ref <5.0)
Triglycerides: 181 mg/dL — ABNORMAL HIGH (ref <150)
VLDL: 36 mg/dL — ABNORMAL HIGH (ref <30)

## 2016-01-04 MED ORDER — YAZ 3-0.02 MG PO TABS: 1.0000 | ORAL_TABLET | Freq: Every day | ORAL | 3 refills | Status: DC

## 2016-01-05 LAB — FERRITIN: Ferritin: 48 ng/mL (ref 10–232)

## 2016-01-05 LAB — VITAMIN D 25 HYDROXY (VIT D DEFICIENCY, FRACTURES): VIT D 25 HYDROXY: 40 ng/mL (ref 30–100)

## 2016-01-08 ENCOUNTER — Ambulatory Visit (INDEPENDENT_AMBULATORY_CARE_PROVIDER_SITE_OTHER): Payer: 59 | Admitting: Licensed Clinical Social Worker

## 2016-01-08 DIAGNOSIS — F5102 Adjustment insomnia: Secondary | ICD-10-CM

## 2016-01-08 DIAGNOSIS — F429 Obsessive-compulsive disorder, unspecified: Secondary | ICD-10-CM | POA: Diagnosis not present

## 2016-01-08 DIAGNOSIS — F411 Generalized anxiety disorder: Secondary | ICD-10-CM

## 2016-01-08 LAB — PAP IG AND HPV HIGH-RISK: HPV DNA High Risk: NOT DETECTED
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
## 2016-01-09 ENCOUNTER — Telehealth: Payer: Self-pay | Admitting: *Deleted

## 2016-01-09 NOTE — Telephone Encounter (Signed)
-----   Message from Jerene BearsMary S Miller, MD sent at 01/08/2016  5:56 PM EST ----- 02 recall.  Please call pt and let her know the pap and HR HPV testing was negative.  Can you please make sure she knows her Vit D was normal, ferritin was normal, CMP was normal, and cholesterol was mildly elevated at 212 with mildly elevated LDLs at 102.  Also, triglycerides were mildly elevated at 181.  Ok to watch all of this and repeat next year.  I'm sure her Mychart was active over the weekend when I signed off on the lab work but it isn't today so I just want to make sure she gets a copy of this.  Thanks.

## 2016-01-09 NOTE — Telephone Encounter (Signed)
Patient returned call. Results reviewed with patient and she verbalized understanding. AEX scheduled for 04/16/17.   Routing to provider for final review. Patient agreeable to disposition. Will close encounter.

## 2016-01-09 NOTE — Telephone Encounter (Signed)
Message left to return call to Anslee Micheletti at [phone].    

## 2016-01-15 ENCOUNTER — Telehealth (HOSPITAL_COMMUNITY): Payer: Self-pay | Admitting: Psychiatry

## 2016-01-15 NOTE — Telephone Encounter (Signed)
Pt wants to talk to nurse. The only info given to me was it was about what she was being seen here for.

## 2016-01-16 NOTE — Telephone Encounter (Signed)
Return telephone call to pt. Pt express she is experiencing more anxiety at work. Pt would like to speak with provider about FMLA paperwork. Offered pt an earlier apt. Pt is schedule an apt on 12/21.

## 2016-01-17 ENCOUNTER — Encounter (HOSPITAL_COMMUNITY): Payer: Self-pay | Admitting: Psychiatry

## 2016-01-17 ENCOUNTER — Ambulatory Visit (INDEPENDENT_AMBULATORY_CARE_PROVIDER_SITE_OTHER): Payer: 59 | Admitting: Psychiatry

## 2016-01-17 VITALS — BP 126/70 | HR 100 | Resp 16 | Ht 62.0 in | Wt 144.0 lb

## 2016-01-17 DIAGNOSIS — Z8249 Family history of ischemic heart disease and other diseases of the circulatory system: Secondary | ICD-10-CM

## 2016-01-17 DIAGNOSIS — F410 Panic disorder [episodic paroxysmal anxiety] without agoraphobia: Secondary | ICD-10-CM

## 2016-01-17 DIAGNOSIS — F429 Obsessive-compulsive disorder, unspecified: Secondary | ICD-10-CM | POA: Diagnosis not present

## 2016-01-17 DIAGNOSIS — Z79899 Other long term (current) drug therapy: Secondary | ICD-10-CM | POA: Diagnosis not present

## 2016-01-17 DIAGNOSIS — F411 Generalized anxiety disorder: Secondary | ICD-10-CM

## 2016-01-17 DIAGNOSIS — Z833 Family history of diabetes mellitus: Secondary | ICD-10-CM

## 2016-01-17 MED ORDER — CLONAZEPAM 0.5 MG PO TABS: 0.5000 mg | ORAL_TABLET | Freq: Two times a day (BID) | ORAL | 0 refills | Status: DC | PRN

## 2016-01-17 MED ORDER — FLUOXETINE HCL 20 MG PO TABS: 20.0000 mg | ORAL_TABLET | Freq: Every day | ORAL | 0 refills | Status: DC

## 2016-01-17 NOTE — Progress Notes (Signed)
[facility]   Patient Ho: Frances ProwsLabrina M Ho MRN:  [id number] Date of Evaluation:  01/17/2016 Referral Source: Primary care Chief Complaint:   Chief Complaint    Follow-up     Visit Diagnosis:  OCD GAD Insomnia History of Present Illness:  45 years old currently single Caucasian female who is living with her mom. Referred by primary care physician for management of sleep and OCD  Patient continues to remain extremely stressful and anxious regarding her job situation says that she is being asked to to too much she is not able to take over time. She is the only delayed by 1 hour off work after submitting a request that her work and start later so that she can have enough hours of sleep  She feels the prevention of going to work and maintaining her work as she feels that she is going to distressed and her anxiety may even worse She feels she's, panic and become more dysfunctional she's not able to focus and concentrate on her work regarding her current anxiety level. She is requesting days off from work so that she can recover and stabilize  Panic and extreme anxiety at work. Gone worse.  Does not want to increase prozac due to tinnitis Aggravating factor: obsessions, job stress. overwhelemed   Past Medical History:  Past Medical History:  Diagnosis Date  . Anxiety   . Chronic headaches   . Depression   . Endometriosis   . Genital warts   . Heart murmur   . Mitral valve prolapse   . OSA (obstructive sleep apnea) 07/30/2010  . Tachycardia     Past Surgical History:  Procedure Laterality Date  . COLPOSCOPY  3/09   w/ECC-neg  . FOOT SURGERY  2005   left  . ovarian cyst removed    . PELVIC LAPAROSCOPY Bilateral 11/13/03   laparoscopic removal bilateral endometriomas    Family Psychiatric History: Mom : depression and anxiety  Family History:  Family History  Problem Relation Age of Onset  . Skin cancer Mother   . Diabetes      grandfather   . Hypertension      grandfather/grandmother  . Heart disease Maternal Grandmother   . Heart disease Maternal Grandfather   . Heart disease Paternal Grandmother   . Heart disease Paternal Grandfather     Social History:   Social History   Social History  . Marital status: Single    Spouse name: N/A  . Number of children: 0  . 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 Smoking status: Never Smoker  . Smokeless tobacco: Never Used  . Alcohol use No  . Drug use: No  . Sexual activity: Yes    Partners: Male    Birth control/ protection: Pill     Comment: yaz   Other Topics Concern  . None   Social History Narrative   Pt is single and lives with family.      Allergies:  No Known Allergies  Metabolic Disorder Labs: No results found for: HGBA1C, MPG No results found for: PROLACTIN Lab Results  Component Value Date   CHOL 212 (H) 01/04/2016   TRIG 181 (H) 01/04/2016   HDL 74 01/04/2016   CHOLHDL 2.9 01/04/2016   VLDL 36 (H) 01/04/2016   LDLCALC 102 (H) 01/04/2016   LDLCALC 115 09/15/2014     Current Medications: Current Outpatient Prescriptions  Medication Sig Dispense Refill  . Biotin (BIOTIN 5000) 5 MG CAPS Take 1 capsule by mouth daily.    . clonazePAM (KLONOPIN) 0.5 MG tablet Take 1 tablet (0.5 mg total) by mouth 2 (two) times daily as needed for anxiety. Take half bid prn 45 tablet 0  . fluorometholone (FML) 0.1 % ophthalmic suspension Place 1 drop into both eyes daily as needed (DRY EYES).   1  . FLUoxetine (PROZAC) 20 MG tablet Take 1 tablet (20 mg total) by mouth daily. Fill when due. 30 tablet 0  . folic acid (FOLVITE) 800 MCG tablet Take 800 mcg by mouth daily.      Marland Kitchen. GLUCOSA-CHONDR-NA CHONDR-MSM PO Take 1,500 mg by mouth daily.    . Methylsulfonylmethane (MSM) 1000 MG CAPS Take 1 capsule by mouth daily.      . Multiple Vitamin (MULTIVITAMIN) tablet Take 1 tablet by mouth daily.     .  vitamin E 400 UNIT capsule Take 400 Units by mouth daily.      Marland Kitchen. YAZ 3-0.02 MG tablet Take 1 tablet by mouth daily. 28 tablet 3   No current facility-administered medications for this visit.       Psychiatric Specialty Exam: Review of Systems  Cardiovascular: Negative for chest pain and palpitations.  Gastrointestinal: Negative for nausea.  Musculoskeletal: Negative for neck pain.  Skin: Negative for itching.  Psychiatric/Behavioral: Positive for depression. Negative for substance abuse and suicidal ideas. The patient is nervous/anxious.     Blood pressure 126/70, pulse 100, resp. rate 16, height 5\' 2"  (1.575 m), weight 144 lb (65.3 kg), last menstrual period 12/20/2015, SpO2 97 %.Body mass index is 26.34 kg/m.  General Appearance: Casual  Eye Contact:  Fair  Speech:  Normal Rate  Volume:  Decreased  Mood:  anxious  Affect:  Constricted  Thought Process:  Goal Directed  Orientation:  Full (Time, Place, and Person)  Thought Content:  Rumination  Suicidal Thoughts:  No  Homicidal Thoughts:  No  Memory:  Immediate;   Fair Recent;   Fair  Judgement:  Fair  Insight:  Fair  Psychomotor Activity:  Decreased  Concentration:  Concentration: Fair and Attention Span: Fair  Recall:  [employer]  Fund of Knowledge:Fair  Language: Fair  Akathisia:  Negative  Handed:  Right  AIMS (if indicated):    Assets:  Desire for Improvement  ADL's:  Intact  Cognition: WNL  Sleep:  poor    Treatment Plan Summary: Medication management and Plan as follows  GAD with panic symptoms : does not want to increase prozac. Will increase klonopine 0.5mg  bid  OCD: continue prozac  Will write as per her request for medical leave as she is having extreme anxiety and worry of further destabilization.  Off for 2 weeks for now. Will follow up in 2-3 weeks Report to ED or urgent care for any urgent needs     Thresa RossAKHTAR, Miklos Bidinger, MD 12/21/201711:53 AM

## 2016-01-17 NOTE — Telephone Encounter (Signed)
Letter written and patient seen today

## 2016-01-31 ENCOUNTER — Encounter (HOSPITAL_COMMUNITY): Payer: Self-pay | Admitting: Psychiatry

## 2016-01-31 ENCOUNTER — Ambulatory Visit (INDEPENDENT_AMBULATORY_CARE_PROVIDER_SITE_OTHER): Payer: 59 | Admitting: Psychiatry

## 2016-01-31 VITALS — BP 124/70 | HR 102 | Resp 16 | Ht 62.0 in | Wt 145.0 lb

## 2016-01-31 DIAGNOSIS — F410 Panic disorder [episodic paroxysmal anxiety] without agoraphobia: Secondary | ICD-10-CM

## 2016-01-31 DIAGNOSIS — F5102 Adjustment insomnia: Secondary | ICD-10-CM

## 2016-01-31 DIAGNOSIS — Z8249 Family history of ischemic heart disease and other diseases of the circulatory system: Secondary | ICD-10-CM

## 2016-01-31 DIAGNOSIS — Z79899 Other long term (current) drug therapy: Secondary | ICD-10-CM

## 2016-01-31 DIAGNOSIS — F411 Generalized anxiety disorder: Secondary | ICD-10-CM | POA: Diagnosis not present

## 2016-01-31 DIAGNOSIS — Z833 Family history of diabetes mellitus: Secondary | ICD-10-CM

## 2016-01-31 DIAGNOSIS — F429 Obsessive-compulsive disorder, unspecified: Secondary | ICD-10-CM

## 2016-01-31 NOTE — Progress Notes (Signed)
[facility]   Patient Identification: Frances ProwsLabrina M Forlenza MRN:  [id number] Date of Evaluation:  01/31/2016 Referral Source: Primary care Chief Complaint:   Chief Complaint    Follow-up     Visit Diagnosis:  OCD GAD Insomnia History of Present Illness:  46 years old currently single Caucasian female who is living with her mom. Referred by primary care physician for management of sleep and OCD  Last visit increased the Klonopin that is ASLEEP during the daytime she still feels anxious but she was taken in the day because it makes her sedated She remains apprehensive and extreme anxiety about going back to work she gets overwhelmed and cannot focus she wants to keep away from work and says that she cannot function FMLA papers were filled up today Prozac caused her depression to certain extent but the higher doses she has tinnitus She sensitive to medication as of now she wants to continue Prozac in the Klonopin at night more so rather than during the day Still feels overwhelmed and does not feel she is ready to go back to work at it may make her more dysfunctional Will refer for therapy and see how that helps out.      Past Medical History:  Past Medical History:  Diagnosis Date  . Anxiety   . Chronic headaches   . Depression   . Endometriosis   . Genital warts   . Heart murmur   . Mitral valve prolapse   . OSA (obstructive sleep apnea) 07/30/2010  . Tachycardia     Past Surgical History:  Procedure Laterality Date  . COLPOSCOPY  3/09   w/ECC-neg  . FOOT SURGERY  2005   left  . ovarian cyst removed    . PELVIC LAPAROSCOPY Bilateral 11/13/03   laparoscopic removal bilateral endometriomas    Family Psychiatric History: Mom : depression and anxiety  Family History:  Family History  Problem Relation Age of Onset  . Skin cancer Mother   . Diabetes      grandfather  . Hypertension      grandfather/grandmother  . Heart disease Maternal Grandmother   .  Heart disease Maternal Grandfather   . Heart disease Paternal Grandmother   . Heart disease Paternal Grandfather     Social History:   Social History   Social History  . Marital status: Single    Spouse name: N/A  . Number of children: 0  . 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 Smoking status: Never Smoker  . Smokeless tobacco: Never Used  . Alcohol use No  . Drug use: No  . Sexual activity: Yes    Partners: Male    Birth control/ protection: Pill     Comment: yaz   Other Topics Concern  . None   Social History Narrative   Pt is single and lives with family.      Allergies:  No Known Allergies  Metabolic Disorder Labs: No results found for: HGBA1C, MPG No results found for: PROLACTIN Lab Results  Component Value Date   CHOL 212 (H) 01/04/2016   TRIG 181 (H) 01/04/2016   HDL 74 01/04/2016   CHOLHDL 2.9 01/04/2016   VLDL 36 (H) 01/04/2016   LDLCALC 102 (H) 01/04/2016   LDLCALC 115 09/15/2014     Current Medications: Current Outpatient Prescriptions  Medication Sig Dispense Refill  . Biotin (BIOTIN 5000) 5 MG CAPS  Take 1 capsule by mouth daily.    . clonazePAM (KLONOPIN) 0.5 MG tablet Take 1 tablet (0.5 mg total) by mouth 2 (two) times daily as needed for anxiety. Take half bid prn 45 tablet 0  . fluorometholone (FML) 0.1 % ophthalmic suspension Place 1 drop into both eyes daily as needed (DRY EYES).   1  . FLUoxetine (PROZAC) 20 MG tablet Take 1 tablet (20 mg total) by mouth daily. Fill when due. 30 tablet 0  . folic acid (FOLVITE) 800 MCG tablet Take 800 mcg by mouth daily.      Marland Kitchen GLUCOSA-CHONDR-NA CHONDR-MSM PO Take 1,500 mg by mouth daily.    . Methylsulfonylmethane (MSM) 1000 MG CAPS Take 1 capsule by mouth daily.      . Multiple Vitamin (MULTIVITAMIN) tablet Take 1 tablet by mouth daily.     . vitamin E 400 UNIT capsule Take 400 Units by mouth daily.      Marland Kitchen YAZ 3-0.02 MG tablet  Take 1 tablet by mouth daily. 28 tablet 3   No current facility-administered medications for this visit.       Psychiatric Specialty Exam: Review of Systems  Cardiovascular: Negative for chest pain.  Gastrointestinal: Negative for nausea.  Musculoskeletal: Negative for neck pain.  Skin: Negative for itching.  Psychiatric/Behavioral: Positive for depression. Negative for substance abuse and suicidal ideas. The patient is nervous/anxious.     Blood pressure 124/70, pulse (!) 102, resp. rate 16, height 5\' 2"  (1.575 m), weight 145 lb (65.8 kg), last menstrual period 12/20/2015, SpO2 98 %.Body mass index is 26.52 kg/m.  General Appearance: Casual  Eye Contact:  Fair  Speech:  Normal Rate  Volume:  Decreased  Mood:  anxious  Affect:  Constricted  Thought Process:  Goal Directed  Orientation:  Full (Time, Place, and Person)  Thought Content:  Rumination  Suicidal Thoughts:  No  Homicidal Thoughts:  No  Memory:  Immediate;   Fair Recent;   Fair  Judgement:  Fair  Insight:  Fair  Psychomotor Activity:  Decreased  Concentration:  Concentration: Fair and Attention Span: Fair  Recall:  [employer] of Knowledge:Fair  Language: Fair  Akathisia:  Negative  Handed:  Right  AIMS (if indicated):    Assets:  Desire for Improvement  ADL's:  Intact  Cognition: WNL  Sleep:  poor    Treatment Plan Summary: Medication management and Plan as follows  GAD with panic symptoms : does not want to increase prozac. Will continue klonopine 0,5 at night and half then during the day   OCD: continue prozac. Not worsened. But has to stare at stove and check many times. Does not want to increase dose.   Will write as per her request for medical leave as she is having extreme anxiety and worry of further destabilization. Will give 3-4 weeks off work and figure out in therapy as well how to improve coping skills.   Prescriptions can be called in when ready.  Report to ED or urgent care for any urgent  needs     Thresa Ross, MD 1/4/20183:27 PM

## 2016-02-12 ENCOUNTER — Ambulatory Visit (INDEPENDENT_AMBULATORY_CARE_PROVIDER_SITE_OTHER): Payer: 59 | Admitting: Licensed Clinical Social Worker

## 2016-02-12 DIAGNOSIS — F429 Obsessive-compulsive disorder, unspecified: Secondary | ICD-10-CM | POA: Diagnosis not present

## 2016-02-12 DIAGNOSIS — F411 Generalized anxiety disorder: Secondary | ICD-10-CM | POA: Diagnosis not present

## 2016-02-12 NOTE — Progress Notes (Signed)
   THERAPIST PROGRESS NOTE  Session Time: 3 PM to 3:50 PM  Participation Level: Active  Behavioral Response: CasualAlertEuthymic  Type of Therapy: Individual Therapy  Treatment Goals addressed:  decrease in OCD symptoms, improvement in mood  Interventions: CBT, Solution Focused, Strength-based, Supportive and Other: Stress management, coping strategies for OCD  Summary: Frances Ho is a [age] y.o. female who presents with thinking and it is a good idea to start back to work on a part-time basis. Reviewed the pros and cons of going back to work as opposed to looking for new job. Discussed lowering expectations on herself, recognizing the job environment is making more demands and discussed focusing on things that are in her control as opposed to things outside of herself that she cannot control so that it is "useless" to worry about things she doesn't have control over. Reviewed symptoms and there's been a slight decrease in OCD symptoms. Reviewed treatment plan and patient wants to work on improvement in mood and decrease in notes OCD symptoms. She described sleeping a lot and recognizes this is a sign of depression and avoidance. She relates that not working lowers her self-esteem.   Suicidal/Homicidal: No  Therapist Response: Work with patient on stress management and encouraging patient to focus energy and productive ways. Work with patient on challenging perspective through CBT strategies to help her in coping. Discussed strategy of managing OCD symptoms is to to challenge the obsessions as part of the disease and false so as not to give into them. Provided positive feedback for patient's problem solving in terms of her work situation as well as exploring pros and cons of returning to work and helping in her decision making. Discussed that avoidance is not helpful coping strategy often and reviewed treatment goals  Plan: Return again in 2 weeks.2. Patient work on gaining insight to coping  strategies for managing OCD, mood and stress at implement into her life  Diagnosis: Axis I:  obsessive-compulsive disorder, generalized anxiety disorder    Axis II: No diagnosis    Bowman,Mary A, LCSW 02/12/2016

## 2016-02-15 ENCOUNTER — Telehealth (HOSPITAL_COMMUNITY): Payer: Self-pay | Admitting: *Deleted

## 2016-02-15 NOTE — Telephone Encounter (Signed)
[facility] @ [phone]. Confirmation received at 1231.

## 2016-02-20 ENCOUNTER — Telehealth (HOSPITAL_COMMUNITY): Payer: Self-pay | Admitting: Psychiatry

## 2016-02-20 NOTE — Telephone Encounter (Signed)
Pt needs refill on prozac.  She has 3 pills left.  She needs us to write on the rx to fill either capsules or tabs whichever cheaper.

## 2016-02-21 NOTE — Telephone Encounter (Signed)
Needs tablets.

## 2016-02-22 MED ORDER — FLUOXETINE HCL 20 MG PO TABS: 20.0000 mg | ORAL_TABLET | Freq: Every day | ORAL | 0 refills | Status: DC

## 2016-02-22 NOTE — Telephone Encounter (Signed)
Medication refill- pt called office requesting a refill for Prozac. Per Dr. Gilmore LarocheAkhtar, refill is authorize for Prozac 20mg , #30. Prescription was sent to pharmacy. Pt's next apt is schedule on 02/27/16. Pt verbalizes understanding.

## 2016-02-26 ENCOUNTER — Other Ambulatory Visit: Payer: Self-pay | Admitting: Obstetrics & Gynecology

## 2016-02-26 ENCOUNTER — Ambulatory Visit: Payer: 59 | Admitting: Pulmonary Disease

## 2016-02-26 DIAGNOSIS — Z1231 Encounter for screening mammogram for malignant neoplasm of breast: Secondary | ICD-10-CM

## 2016-02-27 ENCOUNTER — Ambulatory Visit (HOSPITAL_COMMUNITY): Payer: Self-pay | Admitting: Psychiatry

## 2016-02-28 ENCOUNTER — Ambulatory Visit (INDEPENDENT_AMBULATORY_CARE_PROVIDER_SITE_OTHER): Payer: 59 | Admitting: Psychiatry

## 2016-02-28 ENCOUNTER — Encounter (HOSPITAL_COMMUNITY): Payer: Self-pay | Admitting: Psychiatry

## 2016-02-28 VITALS — BP 128/72 | HR 104 | Resp 18 | Ht 62.0 in | Wt 143.0 lb

## 2016-02-28 DIAGNOSIS — Z8249 Family history of ischemic heart disease and other diseases of the circulatory system: Secondary | ICD-10-CM

## 2016-02-28 DIAGNOSIS — F5102 Adjustment insomnia: Secondary | ICD-10-CM

## 2016-02-28 DIAGNOSIS — F429 Obsessive-compulsive disorder, unspecified: Secondary | ICD-10-CM

## 2016-02-28 DIAGNOSIS — Z833 Family history of diabetes mellitus: Secondary | ICD-10-CM

## 2016-02-28 DIAGNOSIS — Z79899 Other long term (current) drug therapy: Secondary | ICD-10-CM

## 2016-02-28 DIAGNOSIS — F411 Generalized anxiety disorder: Secondary | ICD-10-CM | POA: Diagnosis not present

## 2016-02-28 DIAGNOSIS — F410 Panic disorder [episodic paroxysmal anxiety] without agoraphobia: Secondary | ICD-10-CM | POA: Diagnosis not present

## 2016-02-28 DIAGNOSIS — Z9889 Other specified postprocedural states: Secondary | ICD-10-CM

## 2016-02-28 MED ORDER — CLONAZEPAM 0.5 MG PO TABS: 0.5000 mg | ORAL_TABLET | Freq: Two times a day (BID) | ORAL | 0 refills | Status: DC | PRN

## 2016-02-28 MED ORDER — FLUOXETINE HCL 20 MG PO TABS: 20.0000 mg | ORAL_TABLET | Freq: Every day | ORAL | 0 refills | Status: DC

## 2016-02-28 NOTE — Progress Notes (Signed)
[facility]   Patient Identification: Frances ProwsLabrina M Westwood MRN:  [id number] Date of Evaluation:  02/28/2016 Referral Source: Primary care Chief Complaint:   Chief Complaint    Follow-up     Visit Diagnosis:  OCD GAD Insomnia History of Present Illness:  46 years old currently single Caucasian female who is living with her mom. Referred initially by primary care physician for management of sleep, anxiety  and OCD  Patient continues to have depression feeling low energy sleep is disturbed unable to focus at times she remains anxious about work but she feels that she is ready to go back to work. She wants to have some limitations of less hours unless stressful environment Says that she is still not able to go back to work because of some paper discrepancy and we will re do the paperwork  Although she does have apprehension about going to work but she feels that it is time that she goes to work and can tolerate the limited hours of work.  In regard to anxiety and OCD Klonopin does help she takes it regularly. She does get overwhelmed in times of stress or if she finds things are not in order, She is having problems filling prozac due to change in tier. We will write down generic OK.  Says not want to change meds as she has tried others.  Depression fluctuates.   She continues therapy and working on coping skills and how to deal better with stress.  No psychotic symptoms or mania.     Past Medical History:  Past Medical History:  Diagnosis Date  . Anxiety   . Chronic headaches   . Depression   . Endometriosis   . Genital warts   . Heart murmur   . Mitral valve prolapse   . OSA (obstructive sleep apnea) 07/30/2010  . Tachycardia     Past Surgical History:  Procedure Laterality Date  . COLPOSCOPY  3/09   w/ECC-neg  . FOOT SURGERY  2005   left  . ovarian cyst removed    . PELVIC LAPAROSCOPY Bilateral 11/13/03   laparoscopic removal bilateral endometriomas     Family Psychiatric History: Mom : depression and anxiety  Family History:  Family History  Problem Relation Age of Onset  . Skin cancer Mother   . Diabetes      grandfather  . Hypertension      grandfather/grandmother  . Heart disease Maternal Grandmother   . Heart disease Maternal Grandfather   . Heart disease Paternal Grandmother   . Heart disease Paternal Grandfather     Social History:   Social History   Social History  . Marital status: Single    Spouse name: N/A  . Number of children: 0  . 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 Smoking status: Never Smoker  . Smokeless tobacco: Never Used  . Alcohol use No  . Drug use: No  . Sexual activity: Yes    Partners: Male    Birth control/ protection: Pill     Comment: yaz   Other Topics Concern  . None   Social History Narrative   Pt is single and lives with family.      Allergies:  No Known Allergies  Metabolic Disorder Labs: No results found for: HGBA1C, MPG No results found for: PROLACTIN Lab Results  Component Value Date   CHOL 212 (H) 01/04/2016  TRIG 181 (H) 01/04/2016   HDL 74 01/04/2016   CHOLHDL 2.9 01/04/2016   VLDL 36 (H) 01/04/2016   LDLCALC 102 (H) 01/04/2016   LDLCALC 115 09/15/2014     Current Medications: Current Outpatient Prescriptions  Medication Sig Dispense Refill  . Biotin (BIOTIN 5000) 5 MG CAPS Take 1 capsule by mouth daily.    . clonazePAM (KLONOPIN) 0.5 MG tablet Take 1 tablet (0.5 mg total) by mouth 2 (two) times daily as needed for anxiety. Take half bid prn 45 tablet 0  . fluorometholone (FML) 0.1 % ophthalmic suspension Place 1 drop into both eyes daily as needed (DRY EYES).   1  . FLUoxetine (PROZAC) 20 MG tablet Take 1 tablet (20 mg total) by mouth daily. Generic 30 tablet 0  . folic acid (FOLVITE) 800 MCG tablet Take 800 mcg by mouth daily.      Marland Kitchen GLUCOSA-CHONDR-NA CHONDR-MSM PO Take 1,500  mg by mouth daily.    . Methylsulfonylmethane (MSM) 1000 MG CAPS Take 1 capsule by mouth daily.      . Multiple Vitamin (MULTIVITAMIN) tablet Take 1 tablet by mouth daily.     . vitamin E 400 UNIT capsule Take 400 Units by mouth daily.      Marland Kitchen YAZ 3-0.02 MG tablet Take 1 tablet by mouth daily. 28 tablet 3   No current facility-administered medications for this visit.       Psychiatric Specialty Exam: Review of Systems  Cardiovascular: Negative for palpitations.  Gastrointestinal: Negative for nausea.  Musculoskeletal: Negative for neck pain.  Skin: Negative for rash.  Psychiatric/Behavioral: Positive for depression. Negative for substance abuse and suicidal ideas. The patient is nervous/anxious.     Blood pressure 128/72, pulse (!) 104, resp. rate 18, height 5\' 2"  (1.575 m), weight 143 lb (64.9 kg), SpO2 98 %.Body mass index is 26.16 kg/m.  General Appearance: Casual  Eye Contact:  Fair  Speech:  Normal Rate  Volume:  Decreased  Mood:  Somewhat anxious  Affect:  constricted  Thought Process:  Goal Directed  Orientation:  Full (Time, Place, and Person)  Thought Content:  Rumination  Suicidal Thoughts:  No  Homicidal Thoughts:  No  Memory:  Immediate;   Fair Recent;   Fair  Judgement:  Fair  Insight:  Fair  Psychomotor Activity:  Decreased  Concentration:  Concentration: Fair and Attention Span: Fair  Recall:  [employer] of Knowledge:Fair  Language: Fair  Akathisia:  Negative  Handed:  Right  AIMS (if indicated):    Assets:  Desire for Improvement  ADL's:  Intact  Cognition: WNL  Sleep:  poor    Treatment Plan Summary: Medication management and Plan as follows   GAD and panic symptoms: fluctuates. Has not gone worse. Responds to klonopine . Somewhat stressed about going back to work but says will need limited hours and some restrictions.   OCD: continue prozac.baseline unless there is a change in routine or stress level.  Patient requesting paperwork for joining  back to work and also documentation of paperwork for the dates she has not been able to work in past while under treatment here.    Prescriptions are printed.  She can look for pharmacy which would be better in co pay for generic or can contact us to work on pre authorization.  Report to ED or urgent care for any urgent needs  All other questions answered.  FU one month and continue therapy as scheduled.    Thresa Ross, MD 2/1/20184:10  PM

## 2016-02-29 ENCOUNTER — Telehealth (HOSPITAL_COMMUNITY): Payer: Self-pay | Admitting: *Deleted

## 2016-02-29 NOTE — Telephone Encounter (Signed)
[facility] at [phone]. Confirmation received.

## 2016-03-03 ENCOUNTER — Ambulatory Visit (INDEPENDENT_AMBULATORY_CARE_PROVIDER_SITE_OTHER): Payer: 59 | Admitting: Licensed Clinical Social Worker

## 2016-03-03 DIAGNOSIS — F411 Generalized anxiety disorder: Secondary | ICD-10-CM

## 2016-03-03 DIAGNOSIS — F429 Obsessive-compulsive disorder, unspecified: Secondary | ICD-10-CM | POA: Diagnosis not present

## 2016-03-03 NOTE — Progress Notes (Signed)
   THERAPIST PROGRESS NOTE  Session Time: 3:05 PM to 3:55 PM  Participation Level: Active  Behavioral Response: CasualAlertEuthymic  Type of Therapy: Individual Therapy  Treatment Goals addressed: decrease in OCD symptoms, improvement in mood  Interventions: CBT, Solution Focused, Strength-based, Supportive, Reframing and Other: Stress management  Summary: Frances Ho is a 46 y.o. female who presents with going back to work next Tuesday part-time. Discussed her stressors include paperwork, being more anxious now that she's been out and problems with medications. Her insurance does not approve tablets and capsules caused her to wake up in the middle the night and haven't gotten rest for 5 days. She has been taking steps to problem solve around medications and other stressors. Discussed CBT strategies that will help patient in managing anxiety including not focusing on the future which is unknown, not focusing on things that are outside of her control, and using positive self talk. In terms of developing a plan to be in a situation that would be a positive experience she is unsure. The money is better at her current job but not sure if it is worth it because of the stress. Recognized that decision making is a process and figuring things out depending on how things unfold.  Encouraged patient to focus on the positive as people appreciating her when she gets back. Discussed upbringing her mom was critical and negative and how that factors into her being critical of herself. Reviewed session and patient idenfied reminding herself of not focusing on things out of her control self acceptance.  Suicidal/Homicidal: No  Therapist Response: Reviewed progress and symptoms. Discussed CBT strategies including recognizing a challenging unhelpful thoughts and distortions in perspective to help cope Identified that one source of anxiety was  related to mom being critical and negative. Discussed how insight to  this will help her in identifying and challenging negative self-talk. Discussed replacing negative self talk with more positive will help with mental health symptoms. Help patient to process feelings around stressors and provided strength-based interventions including taking steps to stand up for herself. Discussed serenity prayer and not focusing on things she can control but on things she can. Asked patient question of what she wants for herself in the future to help her in setting goals and decision making. Encouraged patient to focus on things in the present in terms of anxiety management and effective coping. Provided supportive interventions.  Plan: Return again in 3 weeks.2. Therapist continued to work with patient on CBT strategies and stress management strategies to help her in managing stress and mood  Diagnosis: Axis I: obsessive-compulsive disorder, generalized anxiety disorder    Axis II: No diagnosis    Anna Livers A, LCSW 03/03/2016

## 2016-03-04 ENCOUNTER — Ambulatory Visit
Admission: RE | Admit: 2016-03-04 | Discharge: 2016-03-04 | Disposition: A | Discharge status: Home / Self Care | Payer: 59 | Source: Ambulatory Visit | Attending: Obstetrics & Gynecology | Admitting: Obstetrics & Gynecology

## 2016-03-04 DIAGNOSIS — Z1231 Encounter for screening mammogram for malignant neoplasm of breast: Secondary | ICD-10-CM

## 2016-03-06 ENCOUNTER — Telehealth (HOSPITAL_COMMUNITY): Payer: Self-pay | Admitting: *Deleted

## 2016-03-06 NOTE — Telephone Encounter (Signed)
Received fax from [facility]rin [facility]. Paperwork complete and faxed to [phone], claim #[id number]. Confirmation received.

## 2016-03-07 ENCOUNTER — Encounter: Payer: Self-pay | Admitting: Cardiology

## 2016-03-11 ENCOUNTER — Other Ambulatory Visit: Payer: Self-pay | Admitting: Obstetrics & Gynecology

## 2016-03-11 NOTE — Telephone Encounter (Signed)
Medication refill request: Yaz Last AEX:  01/04/16 SM Next AEX: 04/16/17 SM Last MMG (if hormonal medication request): 03/04/16 BIRADS1, Density C, TBC Refill authorized: 01/04/16 #28 3R. Please advise. Thank you.

## 2016-03-12 ENCOUNTER — Encounter: Payer: Self-pay | Admitting: Cardiology

## 2016-03-12 ENCOUNTER — Encounter (INDEPENDENT_AMBULATORY_CARE_PROVIDER_SITE_OTHER): Payer: Self-pay

## 2016-03-12 ENCOUNTER — Ambulatory Visit (INDEPENDENT_AMBULATORY_CARE_PROVIDER_SITE_OTHER): Payer: 59 | Admitting: Cardiology

## 2016-03-12 ENCOUNTER — Ambulatory Visit (INDEPENDENT_AMBULATORY_CARE_PROVIDER_SITE_OTHER): Payer: 59

## 2016-03-12 VITALS — BP 108/70 | HR 96 | Ht 62.0 in | Wt 143.4 lb

## 2016-03-12 DIAGNOSIS — I519 Heart disease, unspecified: Secondary | ICD-10-CM | POA: Diagnosis not present

## 2016-03-12 DIAGNOSIS — R079 Chest pain, unspecified: Secondary | ICD-10-CM

## 2016-03-12 DIAGNOSIS — I341 Nonrheumatic mitral (valve) prolapse: Secondary | ICD-10-CM

## 2016-03-12 DIAGNOSIS — I5189 Other ill-defined heart diseases: Secondary | ICD-10-CM

## 2016-03-12 HISTORY — DX: Other ill-defined heart diseases: I51.89

## 2016-03-12 LAB — EXERCISE TOLERANCE TEST
CSEPED: 9 min
CSEPEW: 10.1 METS
CSEPHR: 85 %
Exercise duration (sec): 0 s
MPHR: 176 {beats}/min
Peak HR: 150 {beats}/min
RPE: 17
Rest HR: 84 {beats}/min

## 2016-03-12 NOTE — Progress Notes (Signed)
Cardiology Office Note    Date:  03/13/2016   ID:  Frances Ho, DOB 01-14-1971, MRN [id number]  PCP:  No PCP Per Patient  Cardiologist:  Armanda Magic, MD   Chief Complaint  Patient presents with  . Mitral Regurgitation    History of Present Illness:  Frances Ho is a 46 y.o. 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 The last time I saw her she was having chest pain and an ETT was ordered but she cancelled it because she was scared she would not be able to do it.  She is still having CP.  This occurs sporadically and only once monthly lasting a few hours and then resolves.   She denies any SOB (except going up stairs), LE edema, dizziness, palpitations or syncope.   She denies any LE edema, dizziness, palpitations or syncope.     Past Medical History:  Diagnosis Date  . Anxiety   . Chronic headaches   . Depression   . Diastolic dysfunction 03/12/2016  . Endometriosis   . Genital warts   . Heart murmur   . Mitral valve prolapse   . OSA (obstructive sleep apnea) 07/30/2010  . Tachycardia     Past Surgical History:  Procedure Laterality Date  . COLPOSCOPY  3/09   w/ECC-neg  . FOOT SURGERY  2005   left  . ovarian cyst removed    . PELVIC LAPAROSCOPY Bilateral 11/13/03   laparoscopic removal bilateral endometriomas    Current Medications: Current Meds  Medication Sig  . Biotin (BIOTIN 5000) 5 MG CAPS Take 1 capsule by mouth daily.  . clonazePAM (KLONOPIN) 0.5 MG tablet Take 1 tablet (0.5 mg total) by mouth 2 (two) times daily as needed for anxiety. Take half bid prn  . fluorometholone (FML) 0.1 % ophthalmic suspension Place 1 drop into both eyes daily as needed (DRY EYES).   Marland Kitchen FLUoxetine (PROZAC) 20 MG tablet Take 1 tablet (20 mg total) by mouth daily. Generic  . folic acid (FOLVITE) 800 MCG tablet Take 800 mcg by mouth daily.    Marland Kitchen GLUCOSA-CHONDR-NA CHONDR-MSM PO Take 1,500 mg by mouth daily.  . Magnesium  100 MG CAPS Take 100 mg by mouth 2 (two) times daily.  . Methylsulfonylmethane (MSM) 1000 MG CAPS Take 1 capsule by mouth daily.    . Multiple Vitamin (MULTIVITAMIN) tablet Take 1 tablet by mouth daily.   . vitamin E 400 UNIT capsule Take 400 Units by mouth daily.    Marland Kitchen YAZ 3-0.02 MG tablet TAKE 1 TABLET BY MOUTH DAILY.    Allergies:   Patient has no known allergies.   Social History   Social History  . Marital status: Single    Spouse name: N/A  . Number of children: 0  . 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 Smoking status: Never Smoker  . Smokeless tobacco: Never Used  . Alcohol use No  . Drug use: No  . Sexual activity: Yes    Partners: Male    Birth control/ protection: Pill     Comment: yaz   Other Topics Concern  . Not on file   Social History Narrative   Pt is single and lives with family.      Family History:  The patient's family history includes Heart disease in her maternal grandfather, maternal grandmother,  paternal grandfather, and paternal grandmother; Skin cancer in her mother.   ROS:   Please see the history of present illness.    ROS All other systems reviewed and are negative.  No flowsheet data found.     PHYSICAL EXAM:   VS:  BP 108/70   Pulse 96   Ht 5\' 2"  (1.575 m)   Wt 143 lb 6.4 oz (65 kg)   LMP 02/21/2016   BMI 26.23 kg/m    GEN: Well nourished, well developed, in no acute distress  HEENT: normal  Neck: no JVD, carotid bruits, or masses Cardiac: RRR; no murmurs, rubs, or gallops,no edema.  Intact distal pulses bilaterally.  Respiratory:  clear to auscultation bilaterally, normal work of breathing GI: soft, nontender, nondistended, + BS MS: no deformity or atrophy  Skin: warm and dry, no rash Neuro:  Alert and Oriented x 3, Strength and sensation are intact Psych: euthymic mood, full affect  Wt Readings from Last 3 Encounters:  03/12/16 143 lb 6.4 oz (65  kg)  02/28/16 143 lb (64.9 kg)  01/31/16 145 lb (65.8 kg)      Studies/Labs Reviewed:   EKG:  EKG is not ordered today.    Recent Labs: 04/30/2015: Hemoglobin 13.7; Platelets 220 01/04/2016: ALT 16; BUN 11; Creat 0.91; Potassium 4.2; Sodium 138   Lipid Panel    Component Value Date/Time   CHOL 212 (H) 01/04/2016 1410   TRIG 181 (H) 01/04/2016 1410   HDL 74 01/04/2016 1410   CHOLHDL 2.9 01/04/2016 1410   VLDL 36 (H) 01/04/2016 1410   LDLCALC 102 (H) 01/04/2016 1410    Additional studies/ records that were reviewed today include:  none    ASSESSMENT:    1. Mitral valve prolapse   2. Diastolic dysfunction   3. Chest pain, unspecified type      PLAN:  In order of problems listed above:  1. MVP with mild MR by echo 01/2015. 2. Diastolic dysfunction - stable with no SOB 3. Chest pain - this is atypical but persistent.  Suspect musculoskeletal.  Will get ETT that was ordered a while back and not done.    Medication Adjustments/Labs and Tests Ordered: Current medicines are reviewed at length with the patient today.  Concerns regarding medicines are outlined above.  Medication changes, Labs and Tests ordered today are listed in the Patient Instructions below.  Patient Instructions  Medication Instructions:  Your physician recommends that you continue on your current medications as directed. Please refer to the Current Medication list given to you today.   Labwork: None  Testing/Procedures: Your physician has requested that you have an exercise tolerance test. For further information please visit [email or web address]. Please also follow instruction sheet, as given.  Follow-Up: Your physician wants you to follow-up in: 1 year with Dr. Mayford Knifeurner. You will receive a reminder letter in the mail two months in advance. If you don't receive a letter, please call our office to schedule the follow-up appointment.   Any Other Special Instructions Will Be Listed Below (If  Applicable).     If you need a refill on your cardiac medications before your next appointment, please call your pharmacy.      Signed, Armanda Magicraci Orli Degrave, MD  03/13/2016 8:47 AM    [facility] [address], [place], [place]  [address] Phone: [phone]; Fax: [phone]

## 2016-03-12 NOTE — Patient Instructions (Signed)
Medication Instructions:  Your physician recommends that you continue on your current medications as directed. Please refer to the Current Medication list given to you today.   Labwork: None  Testing/Procedures: Your physician has requested that you have an exercise tolerance test. For further information please visit [email or web address]. Please also follow instruction sheet, as given.  Follow-Up: Your physician wants you to follow-up in: 1 year with Dr. Turner. You will receive a reminder letter in the mail two months in advance. If you don't receive a letter, please call our office to schedule the follow-up appointment.   Any Other Special Instructions Will Be Listed Below (If Applicable).     If you need a refill on your cardiac medications before your next appointment, please call your pharmacy.   

## 2016-03-13 ENCOUNTER — Telehealth: Payer: Self-pay | Admitting: Cardiology

## 2016-03-13 NOTE — Telephone Encounter (Signed)
Informed patient of results and verbal understanding expressed.  

## 2016-03-13 NOTE — Telephone Encounter (Signed)
-----   Message from [name], MD sent at 03/13/2016  8:26 AM EST ----- Please let patient know that stress test was fine

## 2016-03-13 NOTE — Telephone Encounter (Signed)
New Message    Pt is calling in regards to results of her stress test

## 2016-03-21 ENCOUNTER — Other Ambulatory Visit (HOSPITAL_COMMUNITY): Payer: Self-pay | Admitting: Psychiatry

## 2016-03-24 NOTE — Telephone Encounter (Signed)
Medication refill- received fax from [facility] requesting a refill for Prozac. Pt received printed prescription on 02/28/16 for Prozac 20mg , #30. Per Dr. Gilmore LarocheAkhtar, refill request is denied. Pt has request medication too early. Pt's next apt is schedule on 03/27/16. Nothing further is needed at this time.

## 2016-03-25 ENCOUNTER — Ambulatory Visit: Payer: Self-pay

## 2016-03-27 ENCOUNTER — Ambulatory Visit (INDEPENDENT_AMBULATORY_CARE_PROVIDER_SITE_OTHER): Payer: 59 | Admitting: Psychiatry

## 2016-03-27 ENCOUNTER — Encounter (HOSPITAL_COMMUNITY): Payer: Self-pay | Admitting: Psychiatry

## 2016-03-27 VITALS — BP 128/76 | HR 93 | Resp 16 | Ht 62.0 in | Wt 139.0 lb

## 2016-03-27 DIAGNOSIS — F411 Generalized anxiety disorder: Secondary | ICD-10-CM

## 2016-03-27 DIAGNOSIS — F429 Obsessive-compulsive disorder, unspecified: Secondary | ICD-10-CM | POA: Diagnosis not present

## 2016-03-27 DIAGNOSIS — Z79899 Other long term (current) drug therapy: Secondary | ICD-10-CM

## 2016-03-27 DIAGNOSIS — F410 Panic disorder [episodic paroxysmal anxiety] without agoraphobia: Secondary | ICD-10-CM

## 2016-03-27 MED ORDER — FLUOXETINE HCL 20 MG PO TABS: 30.0000 mg | ORAL_TABLET | Freq: Every day | ORAL | 0 refills | Status: DC

## 2016-03-27 NOTE — Progress Notes (Signed)
[facility]   Patient Identification: Frances ProwsLabrina M Sanford MRN:  [id number] Date of Evaluation:  03/27/2016 Referral Source: Primary care Chief Complaint:   Chief Complaint    Follow-up     Visit Diagnosis:  OCD GAD Insomnia History of Present Illness:  46 years old currently single Caucasian female who is living with her mom. Referred initially by primary care physician for management of sleep, anxiety  and OCD  Patient is return to work currently she is working a less stressful job they have changed her job responsibilities and her anxiety is less she feels she can handle a job stress but now she is worried along what they keep her at this level of job. Accommodations have been adjusted so that she is able to perform her work but less stress and she is liking the job she is able to handle it  Her worries are she is worried that if this job changes may be temporary.  Her depression is also improved she is tolerating Prozac but she feels her OCD symptoms are still there she has to check on things keep everything in order and at times it takes more than it should be even at work and at home. She wants to increase the dose of the Prozac. Insomnia not worsened. Less racy thoughts at night   She continues therapy and working on coping skills and how to deal better with stress.  No psychotic symptoms or mania.     Past Medical History:  Past Medical History:  Diagnosis Date  . Anxiety   . Chronic headaches   . Depression   . Diastolic dysfunction 03/12/2016  . Endometriosis   . Genital warts   . Heart murmur   . Mitral valve prolapse   . OSA (obstructive sleep apnea) 07/30/2010  . Tachycardia     Past Surgical History:  Procedure Laterality Date  . COLPOSCOPY  3/09   w/ECC-neg  . FOOT SURGERY  2005   left  . ovarian cyst removed    . PELVIC LAPAROSCOPY Bilateral 11/13/03   laparoscopic removal bilateral endometriomas    Family Psychiatric History: Mom :  depression and anxiety  Family History:  Family History  Problem Relation Age of Onset  . Skin cancer Mother   . Diabetes      grandfather  . Hypertension      grandfather/grandmother  . Heart disease Maternal Grandmother   . Heart disease Maternal Grandfather   . Heart disease Paternal Grandmother   . Heart disease Paternal Grandfather     Social History:   Social History   Social History  . Marital status: Single    Spouse name: N/A  . Number of children: 0  . 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 Smoking status: Never Smoker  . Smokeless tobacco: Never Used  . Alcohol use No  . Drug use: No  . Sexual activity: Yes    Partners: Male    Birth control/ protection: Pill     Comment: yaz   Other Topics Concern  . None   Social History Narrative   Pt is single and lives with family.      Allergies:  No Known Allergies  Metabolic Disorder Labs: No results found for: HGBA1C, MPG No results found for: PROLACTIN Lab Results  Component Value Date   CHOL 212 (H) 01/04/2016   TRIG 181 (H) 01/04/2016  HDL 74 01/04/2016   CHOLHDL 2.9 01/04/2016   VLDL 36 (H) 01/04/2016   LDLCALC 102 (H) 01/04/2016   LDLCALC 115 09/15/2014     Current Medications: Current Outpatient Prescriptions  Medication Sig Dispense Refill  . Biotin (BIOTIN 5000) 5 MG CAPS Take 1 capsule by mouth daily.    . clonazePAM (KLONOPIN) 0.5 MG tablet Take 1 tablet (0.5 mg total) by mouth 2 (two) times daily as needed for anxiety. Take half bid prn 45 tablet 0  . fluorometholone (FML) 0.1 % ophthalmic suspension Place 1 drop into both eyes daily as needed (DRY EYES).   1  . FLUoxetine (PROZAC) 20 MG tablet Take 1.5 tablets (30 mg total) by mouth daily. Generic Prescriptions is for 2 months. 90 tablet 0  . folic acid (FOLVITE) 800 MCG tablet Take 800 mcg by mouth daily.      Marland Kitchen GLUCOSA-CHONDR-NA CHONDR-MSM PO Take 1,500 mg by  mouth daily.    . Magnesium 100 MG CAPS Take 100 mg by mouth 2 (two) times daily.    . Methylsulfonylmethane (MSM) 1000 MG CAPS Take 1 capsule by mouth daily.      . Multiple Vitamin (MULTIVITAMIN) tablet Take 1 tablet by mouth daily.     . vitamin E 400 UNIT capsule Take 400 Units by mouth daily.      Marland Kitchen YAZ 3-0.02 MG tablet TAKE 1 TABLET BY MOUTH DAILY. 28 tablet 3   No current facility-administered medications for this visit.       Psychiatric Specialty Exam: Review of Systems  Cardiovascular: Negative for chest pain.  Gastrointestinal: Negative for nausea.  Musculoskeletal: Negative for neck pain.  Skin: Negative for rash.  Psychiatric/Behavioral: Negative for substance abuse and suicidal ideas.    Blood pressure 128/76, pulse 93, resp. rate 16, height 5\' 2"  (1.575 m), weight 139 lb (63 kg), SpO2 96 %.Body mass index is 25.42 kg/m.  General Appearance: Casual  Eye Contact:  Fair  Speech:  Normal Rate  Volume:  Decreased  Mood:  Anxiety is less. Mood is improved  Affect:  congruent  Thought Process:  Goal Directed  Orientation:  Full (Time, Place, and Person)  Thought Content:  Rumination  Suicidal Thoughts:  No  Homicidal Thoughts:  No  Memory:  Immediate;   Fair Recent;   Fair  Judgement:  Fair  Insight:  Fair  Psychomotor Activity:  Decreased  Concentration:  Concentration: Fair and Attention Span: Fair  Recall:  [employer] of Knowledge:Fair  Language: Fair  Akathisia:  Negative  Handed:  Right  AIMS (if indicated):    Assets:  Desire for Improvement  ADL's:  Intact  Cognition: WNL  Sleep:  poor    Treatment Plan Summary: Medication management and Plan as follows   GAD and panic symptoms: improved. Less stressed with new job changes and adjustment.  Continue prozac and klonopine  OCD: checking and keeping things in order still bothers her will increase prozac to 30mg  .    Depression: improved. Continue prozac Reviewed sleep hygiene and side effects FU  6 to 8 weeks,  prozac printed generic for cost efficiency search as she requested.    Thresa Ross, MD 3/1/20183:42 PM

## 2016-04-01 ENCOUNTER — Ambulatory Visit (HOSPITAL_COMMUNITY): Payer: Self-pay | Admitting: Licensed Clinical Social Worker

## 2016-04-14 ENCOUNTER — Ambulatory Visit (HOSPITAL_COMMUNITY): Payer: Self-pay | Admitting: Licensed Clinical Social Worker

## 2016-05-19 ENCOUNTER — Ambulatory Visit (INDEPENDENT_AMBULATORY_CARE_PROVIDER_SITE_OTHER): Payer: 59 | Admitting: Licensed Clinical Social Worker

## 2016-05-19 DIAGNOSIS — F429 Obsessive-compulsive disorder, unspecified: Secondary | ICD-10-CM

## 2016-05-19 NOTE — Progress Notes (Signed)
   THERAPIST PROGRESS NOTE  Session Time: 11:20am-12:10pm  Participation Level: Active  Behavioral Response: CasualAlertDysphoric  Type of Therapy: Individual Therapy  Treatment Goals addressed: Reduce OCD behaviors  Interventions: CBT   Suicidal/Homicidal: Denied both  Therapist Interventions:  Met with patient for the first time as the therapist she has been seeing is out on medical leave.  Gathered information about current concerns.  Gathered information about treatment history.  Introduced a book called What to Do When Your Brain Gets Stuck.  Reviewed many of the main concepts from the book.  Explained the concept of fight or flight and how it is activated whenever you think there is a potential threat.  Emphasized that one of the primary treatment recommendations involves having the OCD thoughts or urges occur and not neutralize the anxiety by performing some kind of ritual.  By allowing yourself to experience the anxiety you learn that the nervousness can be tolerated and it recedes over time.  Reviewed several strategies for doing this: delaying acting on the urge, walking away from the stimulus, limiting the number of times you perform a ritual, changing the ritual, doing the opposite, and using humor as a way to reframe the situation. Provided patient with a worksheet describing a breathing exercise that is highly recommended for coping with anxiety called the 4-7-8 Breath.     Summary:  Reported OCD has worsened in the past month or so.  Described engaging in a lot of checking behaviors which are taking up a considerable amount of time.   Noted that she returned to work in mid-February.  Her work responsibilities have changed and she says it is less stressful. Was not very familiar with the concept of fight or flight.  Seemed to understand reasons for exposure.  Has not tried any of the strategies specifically mentioned by the therapist today.    Plan: Scheduled to return on  May 15th.    Diagnosis: OCD    Armandina Stammer 05/19/2016

## 2016-05-27 ENCOUNTER — Ambulatory Visit (HOSPITAL_COMMUNITY): Payer: Self-pay | Admitting: Licensed Clinical Social Worker

## 2016-06-10 ENCOUNTER — Ambulatory Visit (INDEPENDENT_AMBULATORY_CARE_PROVIDER_SITE_OTHER): Payer: 59 | Admitting: Licensed Clinical Social Worker

## 2016-06-10 ENCOUNTER — Ambulatory Visit (HOSPITAL_COMMUNITY): Payer: Self-pay | Admitting: Licensed Clinical Social Worker

## 2016-06-10 DIAGNOSIS — F429 Obsessive-compulsive disorder, unspecified: Secondary | ICD-10-CM

## 2016-06-10 NOTE — Progress Notes (Signed)
   THERAPIST PROGRESS NOTE  Session Time: 11:00 AM-11:50 AM  Participation Level: Active  Behavioral Response: CasualAlertEuthymic  Type of Therapy: Individual Therapy  Treatment Goals addressed:  Reduce OCD behaviors  Interventions: CBT, Solution Focused, Strength-based, Supportive and Other: Mentalization  Summary: Frances Ho is a 46 y.o. female who presents with notes today to better describe everything she is dealing with. She describes compulsions in different settings including work and home that are intrusive, bothersome and time-consuming. She is spending 30-45 minutes extra time at work and she is not able to get overtime. She relates she will check computer, drawer, phone and scanner several times. Explored underlying sources driving compulsion and patient related that if she does not sign off with her phone it can lead to being written up. She ties this to being written up earlier for work-related issues, worries about performance at work and how this started increased anxiety and OCD. She checks locks and stoves at home and admits to hearing about house burning down I still that has caused her concerns. She also plays out scenarios of someone breaking into the home and negative consequences. Discussed how her mind goes into the future and to the worse case scenario. Discussed staying present focused and challenging cognitive distortions. Discussed CBT approach of resisting urges and finding out nothing bad happened so patient's homework is to cut down number of times she checks things at home and experience no negative outcome that will help decrease drive a behaviors. Patient to check into biofeedback as a helpful strategy to manage symptoms. Suicidal/Homicidal: No  Therapist Response: Reviewed patient's progress and symptoms since last therapy appointment in February and compulsive behaviors continue to be bothersome for patient. Discussed self reflection as a strategy to help  her begin to manage internal states and behaviors and utilize this approach to step back and identify behaviors as compulsive and not helpful in functioning. Explored with patient negative and cognitive distortions and identified negative fortune telling, challenged and provided alternative more rational thought. Discussed CBT approach for OCD that involves resisting urges to neutralize anxiety and with this resistance recognizing compulsion unnecessary as outcome is not negative that helps challenge negative projections. Work with patient on concept of limited control in helping gain insight that compulsions will not give her more control. Provided supportive and strength-based interventions.  Plan: Return again in 2 weeks.2. Patient to resist urges to check at home and cut down number of times she checks in practicing behavior strategy to help her develop insight to lack of necessity to compulsions.3. Therapist used CBT and insight related interventions to help patient with anxiety and OCD  Diagnosis: Axis I:  OCD    Axis II: No diagnosis    Coolidge BreezeMary Bowman, LCSW 06/10/2016

## 2016-06-16 ENCOUNTER — Ambulatory Visit (HOSPITAL_COMMUNITY): Payer: Self-pay | Admitting: Psychiatry

## 2016-06-19 ENCOUNTER — Ambulatory Visit (INDEPENDENT_AMBULATORY_CARE_PROVIDER_SITE_OTHER): Payer: 59 | Admitting: Psychiatry

## 2016-06-19 ENCOUNTER — Encounter (HOSPITAL_COMMUNITY): Payer: Self-pay | Admitting: Psychiatry

## 2016-06-19 DIAGNOSIS — F410 Panic disorder [episodic paroxysmal anxiety] without agoraphobia: Secondary | ICD-10-CM

## 2016-06-19 DIAGNOSIS — F411 Generalized anxiety disorder: Secondary | ICD-10-CM | POA: Diagnosis not present

## 2016-06-19 DIAGNOSIS — G4700 Insomnia, unspecified: Secondary | ICD-10-CM

## 2016-06-19 DIAGNOSIS — F429 Obsessive-compulsive disorder, unspecified: Secondary | ICD-10-CM | POA: Diagnosis not present

## 2016-06-19 MED ORDER — FLUOXETINE HCL 20 MG PO TABS: 40.0000 mg | ORAL_TABLET | Freq: Every day | ORAL | 0 refills | Status: DC

## 2016-06-19 NOTE — Progress Notes (Signed)
[facility]   Patient Identification: Jiles ProwsLabrina M Mcwright MRN:  [id number] Date of Evaluation:  06/19/2016 Referral Source: Primary care Chief Complaint:   Chief Complaint    Follow-up     Visit Diagnosis:  OCD GAD Insomnia History of Present Illness:  46 years old currently single Caucasian female who is living with her mom. Referred initially by primary care physician for management of sleep, anxiety  and OCD   Patient has returned to work with some accommodations she still working 40 hours job this less stressful than before but she still has to spend half an hour checking the doors getting everything" and aching sure everything is close including the computers she gets frustrated because it takes a toll and extra hours but she is not paid For that  Even at home she has to check the doors and stove and takes 1 hour at least to do that and she gets frustrated and she otherwise gets anxious if she does not repeat the things. She is tolerating Prozac. Takes Klonopin but it makes her sedated so she seldom takes it    No nausea or GI symptoms on prozac Sleep reasonable after some worries she goes thru about checking doors  She continues therapy and working on coping skills and how to deal better with stress.  No psychotic symptoms or mania.     Past Medical History:  Past Medical History:  Diagnosis Date  . Anxiety   . Chronic headaches   . Depression   . Diastolic dysfunction 03/12/2016  . Endometriosis   . Genital warts   . Heart murmur   . Mitral valve prolapse   . OSA (obstructive sleep apnea) 07/30/2010  . Tachycardia     Past Surgical History:  Procedure Laterality Date  . COLPOSCOPY  3/09   w/ECC-neg  . FOOT SURGERY  2005   left  . ovarian cyst removed    . PELVIC LAPAROSCOPY Bilateral 11/13/03   laparoscopic removal bilateral endometriomas    Family Psychiatric History: Mom : depression and anxiety  Family History:  Family History   Problem Relation Age of Onset  . Skin cancer Mother   . Diabetes Unknown        grandfather  . Hypertension Unknown        grandfather/grandmother  . Heart disease Maternal Grandmother   . Heart disease Maternal Grandfather   . Heart disease Paternal Grandmother   . Heart disease Paternal Grandfather     Social History:   Social History   Social History  . Marital status: Single    Spouse name: N/A  . Number of children: 0  . 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 Smoking status: Never Smoker  . Smokeless tobacco: Never Used  . Alcohol use No  . Drug use: No  . Sexual activity: Yes    Partners: Male    Birth control/ protection: Pill     Comment: yaz   Other Topics Concern  . None   Social History Narrative   Pt is single and lives with family.      Allergies:  No Known Allergies  Metabolic Disorder Labs: No results found for: HGBA1C, MPG No results found for: PROLACTIN Lab Results  Component Value Date   CHOL 212 (H) 01/04/2016   TRIG 181 (H) 01/04/2016   HDL 74 01/04/2016   CHOLHDL 2.9 01/04/2016  VLDL 36 (H) 01/04/2016   LDLCALC 102 (H) 01/04/2016   LDLCALC 115 09/15/2014     Current Medications: Current Outpatient Prescriptions  Medication Sig Dispense Refill  . Biotin (BIOTIN 5000) 5 MG CAPS Take 1 capsule by mouth daily.    . clonazePAM (KLONOPIN) 0.5 MG tablet Take 1 tablet (0.5 mg total) by mouth 2 (two) times daily as needed for anxiety. Take half bid prn 45 tablet 0  . fluorometholone (FML) 0.1 % ophthalmic suspension Place 1 drop into both eyes daily as needed (DRY EYES).   1  . FLUoxetine (PROZAC) 20 MG tablet Take 2 tablets (40 mg total) by mouth daily. Generic 180 tablet 0  . folic acid (FOLVITE) 800 MCG tablet Take 800 mcg by mouth daily.      Marland Kitchen GLUCOSA-CHONDR-NA CHONDR-MSM PO Take 1,500 mg by mouth daily.    . Magnesium 100 MG CAPS Take 100 mg by mouth 2 (two)  times daily.    . Methylsulfonylmethane (MSM) 1000 MG CAPS Take 1 capsule by mouth daily.      . Multiple Vitamin (MULTIVITAMIN) tablet Take 1 tablet by mouth daily.     . vitamin E 400 UNIT capsule Take 400 Units by mouth daily.      Marland Kitchen YAZ 3-0.02 MG tablet TAKE 1 TABLET BY MOUTH DAILY. 28 tablet 3   No current facility-administered medications for this visit.       Psychiatric Specialty Exam: Review of Systems  Cardiovascular: Negative for palpitations.  Gastrointestinal: Negative for nausea.  Musculoskeletal: Negative for neck pain.  Skin: Negative for rash.  Psychiatric/Behavioral: Negative for substance abuse and suicidal ideas. The patient is nervous/anxious.     There were no vitals taken for this visit.There is no height or weight on file to calculate BMI.  General Appearance: Casual  Eye Contact:  Fair  Speech:  Normal Rate  Volume:  Decreased  Mood:  Somewhat anxiuos  Affect:  congruent  Thought Process:  Goal Directed  Orientation:  Full (Time, Place, and Person)  Thought Content:  Rumination  Suicidal Thoughts:  No  Homicidal Thoughts:  No  Memory:  Immediate;   Fair Recent;   Fair  Judgement:  Fair  Insight:  Fair  Psychomotor Activity:  Decreased  Concentration:  Concentration: Fair and Attention Span: Fair  Recall:  [employer] of Knowledge:Fair  Language: Fair  Akathisia:  Negative  Handed:  Right  AIMS (if indicated):    Assets:  Desire for Improvement  ADL's:  Intact  Cognition: WNL  Sleep:  poor    Treatment Plan Summary: Medication management and Plan as follows   GAD and panic symptoms: less intense but she over obsessess about doors and computers at work finish time  Increase prozac to 40mg . She will take 2 of 20mg  tabls   OCD: checking and keeping things in order : still takes excessive time. Increase prozac as above. klonopine prn    Depression: not worse Reviewed sleep hygiene Discussed ways to deal with negative reinforcement of  worries fU 2 months or earlier if needed   Thresa Ross, MD 5/24/20184:46 PM

## 2016-06-26 ENCOUNTER — Ambulatory Visit (INDEPENDENT_AMBULATORY_CARE_PROVIDER_SITE_OTHER): Payer: 59 | Admitting: Licensed Clinical Social Worker

## 2016-06-26 ENCOUNTER — Ambulatory Visit (HOSPITAL_COMMUNITY): Payer: Self-pay | Admitting: Psychiatry

## 2016-06-26 DIAGNOSIS — F429 Obsessive-compulsive disorder, unspecified: Secondary | ICD-10-CM

## 2016-06-26 DIAGNOSIS — F411 Generalized anxiety disorder: Secondary | ICD-10-CM | POA: Diagnosis not present

## 2016-06-26 NOTE — Progress Notes (Signed)
   THERAPIST PROGRESS NOTE  Session Time: 3 PM to 3:50 PM  Participation Level: Active  Behavioral Response: CasualAlertEuthymic  Type of Therapy: Individual Therapy  Treatment Goals addressed:  Reduce OCD behaviors  Interventions: CBT, Solution Focused, Strength-based, Supportive and Other: Insight oriented intervention  Summary: Frances Ho is a 46 y.o. female who presents with continuing symptoms of spending excessive time with compulsive behaviors and checking things at work and at home. Depression is not worse and anxiety is better. She is spending at least 30 minutes at work checking things, and at home between 30 minutes to an hour. The doctor recommend that she count one time patient unable to do this and says she is fighting her brain and so frustrated because trying to fight it for so long. She is tried the different coping skills and still finds herself staring at this locks and plugs with inability to compulsive behaviors. She has fears of losing her job now because of running late. She hit head twice in 2011 and went unconscious and wonders if this impacted the OCD symptoms. Therapist discussed with patient that impact from hitting head could have changed something in brain but more than likely symptoms originate from a year and a half ago when OCD symptoms started with problems and work and more than likely trauma that she has to put into the past and hasn't worked through. Discussed the person who was in her job previously was behind and now putting work on her, and we discussed how this is feeding into her anxiety and symptoms and developing a more helpful mindset to help her with stress at work. Patient and therapist discussed that specialty treatment would be helpful as patient is not making progress so patient was given resource for OCD specialty treatment.Therapist will also contact IOP as another option for treatment at a more intense level to help patient with her symptoms.    Suicidal/Homicidal: No  Therapist Response: Reviewed progress and symptoms and discussed how patient's checking and anxiety symptoms related to obsessive need to control. Discussed origin seems related to when she started not to feel safe and work environment remains vigilant and encouraged her to recognize there is not the need to feel so unsafe at work environment anymore. Discussed as well that she cannot control many things in life so excessive need to control to feel safe is working against the reality of life. Discussed acceptance and taking life as it comes will help her in coping better and that she has the resources to cope. Continue to encourage her and resisting compulsions in order not to negatively reinforce behaviors. Discussed working on a different mindset with work to do the best she can as she has only limited control over the external environment.  Reviewed phone application to help patient with mindfulness and relaxation and recommended patient go for specialty treatment for OCD and gave her resource. Patient also wants to look into more intensive treatment at IOP level so will also touch base with IOP program for referral. Provided strength based and supportive interventions.  Plan: 1.both therapist and patient to [facility] for one option to help patient make progress on OCD symptoms. 2.Patient to contact Daiva Evesobert Milan, a therapist who specializes in treatment of OCD and who has a OCD support group to helpher make progress with symptoms.  Diagnosis: Axis I:  OCD, generalized anxiety disorder    Axis II: No diagnosis    Coolidge BreezeMary Bowman, LCSW 06/26/2016

## 2016-07-04 ENCOUNTER — Other Ambulatory Visit: Payer: Self-pay | Admitting: Obstetrics & Gynecology

## 2016-07-04 NOTE — Telephone Encounter (Signed)
Medication refill request: Yaz  Last AEX:  01/04/16 SM Next AEX: 04/16/17 SM Last MMG (if hormonal medication request): 03/04/16 Jed LimerickBIRADS1, D[facility] Refill authorized: 03/11/16 #[id number] 3R. Please advise. Thank you.

## 2016-08-25 ENCOUNTER — Encounter (HOSPITAL_COMMUNITY): Payer: Self-pay | Admitting: Psychiatry

## 2016-08-25 ENCOUNTER — Ambulatory Visit (INDEPENDENT_AMBULATORY_CARE_PROVIDER_SITE_OTHER): Payer: 59 | Admitting: Psychiatry

## 2016-08-25 VITALS — BP 122/70 | HR 90 | Resp 16 | Ht 62.0 in | Wt 138.0 lb

## 2016-08-25 DIAGNOSIS — F429 Obsessive-compulsive disorder, unspecified: Secondary | ICD-10-CM

## 2016-08-25 DIAGNOSIS — F411 Generalized anxiety disorder: Secondary | ICD-10-CM | POA: Diagnosis not present

## 2016-08-25 DIAGNOSIS — F329 Major depressive disorder, single episode, unspecified: Secondary | ICD-10-CM

## 2016-08-25 DIAGNOSIS — F410 Panic disorder [episodic paroxysmal anxiety] without agoraphobia: Secondary | ICD-10-CM

## 2016-08-25 MED ORDER — VENLAFAXINE HCL 37.5 MG PO TABS: 37.5000 mg | ORAL_TABLET | Freq: Two times a day (BID) | ORAL | 1 refills | Status: DC

## 2016-08-25 NOTE — Progress Notes (Signed)
[facility]   Patient Identification: Frances ProwsLabrina M Ho MRN:  [id number] Date of Evaluation:  08/25/2016 Referral Source: Primary care Chief Complaint:   Chief Complaint    Follow-up     Visit Diagnosis:  OCD GAD Insomnia History of Present Illness:  46 years old currently single Caucasian female who is living with her mom. Referred initially by primary care physician for management of sleep, anxiety  and OCD  Patient is working she was having significant issues at work last year. Now she is trying to accommodate but she still has difficulty finishing she gets entangling her obsessive checking that the laser stuff. It adds to her frustration. Even at home she has to check things. Prozac does keep some balance but it causes nausea times she takes it at night she has spending more hours sleeping during the daytime she has had a sleep study done but is reluctant or is having difficulty using a C Pap machine  Takes klonopine prn that helps extreme stress  Aggravating factor: job stress Modifying factor: rest at home.  She continues therapy and working on coping skills and how to deal better with stress.  No psychotic symptoms or mania.     Past Medical History:  Past Medical History:  Diagnosis Date  . Anxiety   . Chronic headaches   . Depression   . Diastolic dysfunction 03/12/2016  . Endometriosis   . Genital warts   . Heart murmur   . Mitral valve prolapse   . OSA (obstructive sleep apnea) 07/30/2010  . Tachycardia     Past Surgical History:  Procedure Laterality Date  . COLPOSCOPY  3/09   w/ECC-neg  . FOOT SURGERY  2005   left  . ovarian cyst removed    . PELVIC LAPAROSCOPY Bilateral 11/13/03   laparoscopic removal bilateral endometriomas    Family Psychiatric History: Mom : depression and anxiety  Family History:  Family History  Problem Relation Age of Onset  . Skin cancer Mother   . Diabetes Unknown        grandfather  . Hypertension  Unknown        grandfather/grandmother  . Heart disease Maternal Grandmother   . Heart disease Maternal Grandfather   . Heart disease Paternal Grandmother   . Heart disease Paternal Grandfather     Social History:   Social History   Social History  . Marital status: Single    Spouse name: N/A  . Number of children: 0  . 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 Smoking status: Never Smoker  . Smokeless tobacco: Never Used  . Alcohol use No  . Drug use: No  . Sexual activity: Yes    Partners: Male    Birth control/ protection: Pill     Comment: yaz   Other Topics Concern  . None   Social History Narrative   Pt is single and lives with family.      Allergies:  No Known Allergies  Metabolic Disorder Labs: No results found for: HGBA1C, MPG No results found for: PROLACTIN Lab Results  Component Value Date   CHOL 212 (H) 01/04/2016   TRIG 181 (H) 01/04/2016   HDL 74 01/04/2016   CHOLHDL 2.9 01/04/2016   VLDL 36 (H) 01/04/2016   LDLCALC 102 (H) 01/04/2016   LDLCALC 115 09/15/2014     Current Medications: Current Outpatient Prescriptions  Medication Sig  Dispense Refill  . Biotin (BIOTIN 5000) 5 MG CAPS Take 1 capsule by mouth daily.    . clonazePAM (KLONOPIN) 0.5 MG tablet Take 1 tablet (0.5 mg total) by mouth 2 (two) times daily as needed for anxiety. Take half bid prn 45 tablet 0  . fluorometholone (FML) 0.1 % ophthalmic suspension Place 1 drop into both eyes daily as needed (DRY EYES).   1  . FLUoxetine (PROZAC) 20 MG tablet Take 2 tablets (40 mg total) by mouth daily. Generic 180 tablet 0  . folic acid (FOLVITE) 800 MCG tablet Take 800 mcg by mouth daily.      Marland Kitchen. GLUCOSA-CHONDR-NA CHONDR-MSM PO Take 1,500 mg by mouth daily.    . Magnesium 100 MG CAPS Take 100 mg by mouth 2 (two) times daily.    . Methylsulfonylmethane (MSM) 1000 MG CAPS Take 1 capsule by mouth daily.      . Multiple Vitamin  (MULTIVITAMIN) tablet Take 1 tablet by mouth daily.     . vitamin E 400 UNIT capsule Take 400 Units by mouth daily.      Marland Kitchen. YAZ 3-0.02 MG tablet TAKE 1 TABLET BY MOUTH DAILY. 28 tablet 10  . venlafaxine (EFFEXOR) 37.5 MG tablet Take 1 tablet (37.5 mg total) by mouth 2 (two) times daily with a meal. 60 tablet 1   No current facility-administered medications for this visit.       Psychiatric Specialty Exam: Review of Systems  Cardiovascular: Negative for chest pain.  Gastrointestinal: Negative for nausea.  Musculoskeletal: Negative for neck pain.  Skin: Negative for rash.  Psychiatric/Behavioral: Positive for depression. Negative for substance abuse and suicidal ideas.    Blood pressure 122/70, pulse 90, resp. rate 16, height 5\' 2"  (1.575 m), weight 138 lb (62.6 kg), SpO2 99 %.Body mass index is 25.24 kg/m.  General Appearance: Casual  Eye Contact:  Fair  Speech:  Normal Rate  Volume:  Decreased  Ho:  Somewhat sad, not hopeless  Affect:  congruent  Thought Process:  Goal Directed  Orientation:  Full (Time, Place, and Person)  Thought Content:  Rumination  Suicidal Thoughts:  No  Homicidal Thoughts:  No  Memory:  Immediate;   Fair Recent;   Fair  Judgement:  Fair  Insight:  Fair  Psychomotor Activity:  Decreased  Concentration:  Concentration: Fair and Attention Span: Fair  Recall:  [employer]Fair  Fund of Knowledge:Fair  Language: Fair  Akathisia:  Negative  Handed:  Right  AIMS (if indicated):    Assets:  Desire for Improvement  ADL's:  Intact  Cognition: WNL  Sleep:  poor    Treatment Plan Summary: Medication management and Plan as follows   GAD and panic symptoms: she tries her best to manage work atleast working full time. Continue prozac    OCD: checking and keeping things in order : this may delay her work and makes her frustrated at home. Will add effexor small dose along with prozac Tiredness: may be realted to sleep apnea. Advised to get cpap machine or review  options   Depression: manageble. Continue current meds FU 2 months or earlier if needed   Thresa RossAKHTAR, Cruzito Standre, MD 7/30/20184:00 PM

## 2016-08-28 ENCOUNTER — Ambulatory Visit (HOSPITAL_COMMUNITY): Payer: Self-pay | Admitting: Psychiatry

## 2016-09-29 ENCOUNTER — Other Ambulatory Visit (HOSPITAL_COMMUNITY): Payer: Self-pay | Admitting: Psychiatry

## 2016-10-01 NOTE — Telephone Encounter (Signed)
Medication refill- received fax from [facility] requesting a refill for Prozac. Last refill was sent 06/19/16. Per Dr. Gilmore LarocheAkhtar, refill request is authorize for Prozac 20mg , #180. Rx was sent to pharmacy. Pt's next apt is schedule on 10/21/16. Lvm informing pt of refill status and apt.

## 2016-10-21 ENCOUNTER — Encounter (HOSPITAL_COMMUNITY): Payer: Self-pay | Admitting: Psychiatry

## 2016-10-21 ENCOUNTER — Ambulatory Visit (INDEPENDENT_AMBULATORY_CARE_PROVIDER_SITE_OTHER): Payer: 59 | Admitting: Psychiatry

## 2016-10-21 VITALS — BP 120/72 | HR 83 | Resp 16 | Ht 62.0 in | Wt 141.0 lb

## 2016-10-21 DIAGNOSIS — F410 Panic disorder [episodic paroxysmal anxiety] without agoraphobia: Secondary | ICD-10-CM

## 2016-10-21 DIAGNOSIS — Z818 Family history of other mental and behavioral disorders: Secondary | ICD-10-CM | POA: Diagnosis not present

## 2016-10-21 DIAGNOSIS — F332 Major depressive disorder, recurrent severe without psychotic features: Secondary | ICD-10-CM | POA: Diagnosis not present

## 2016-10-21 DIAGNOSIS — F5102 Adjustment insomnia: Secondary | ICD-10-CM

## 2016-10-21 DIAGNOSIS — F429 Obsessive-compulsive disorder, unspecified: Secondary | ICD-10-CM

## 2016-10-21 DIAGNOSIS — F411 Generalized anxiety disorder: Secondary | ICD-10-CM

## 2016-10-21 NOTE — Progress Notes (Signed)
[facility] Outpatient Follow up visit   Patient Identification: Frances Ho MRN:  [id number] Date of Evaluation:  10/21/2016 Referral Source: Primary care Chief Complaint:   Chief Complaint    Follow-up     Visit Diagnosis:  OCD GAD Insomnia History of Present Illness:  46 years old currently single Caucasian female who is living with her mom. Referred initially by primary care physician for management of sleep, anxiety  and OCD  Patient is working near full-time Prozac does help but she does sometimes takes longer time to finish her job and her last visit she was feeling distracted somewhat down. Other than Effexor but she decided not to start it she wants to keep on medication. She is feeling less tired we have talked about other option of getting a sleep study and also to have more activity during the daytime. She does take Klonopin when she is extremely stressed one of her worker next to her gets angry easily and that upsets her as well.   Aggravating factor: job stress can be overwhelming Modifying factor: home, family She continues therapy to work on Pharmacologist   No psychosis     Past Medical History:  Past Medical History:  Diagnosis Date  . Anxiety   . Chronic headaches   . Depression   . Diastolic dysfunction 03/12/2016  . Endometriosis   . Genital warts   . Heart murmur   . Mitral valve prolapse   . OSA (obstructive sleep apnea) 07/30/2010  . Tachycardia     Past Surgical History:  Procedure Laterality Date  . COLPOSCOPY  3/09   w/ECC-neg  . FOOT SURGERY  2005   left  . ovarian cyst removed    . PELVIC LAPAROSCOPY Bilateral 11/13/03   laparoscopic removal bilateral endometriomas    Family Psychiatric History: Mom : depression and anxiety  Family History:  Family History  Problem Relation Age of Onset  . Skin cancer Mother   . Diabetes Unknown        grandfather  . Hypertension Unknown        grandfather/grandmother  . Heart disease Maternal  Grandmother   . Heart disease Maternal Grandfather   . Heart disease Paternal Grandmother   . Heart disease Paternal Grandfather     Social History:   Social History   Social History  . Marital status: Single    Spouse name: N/A  . Number of children: 0  . 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 Smoking status: Never Smoker  . Smokeless tobacco: Never Used  . Alcohol use No  . Drug use: No  . Sexual activity: Yes    Partners: Male    Birth control/ protection: Pill     Comment: yaz   Other Topics Concern  . None   Social History Narrative   Pt is single and lives with family.      Allergies:  No Known Allergies  Metabolic Disorder Labs: No results found for: HGBA1C, MPG No results found for: PROLACTIN Lab Results  Component Value Date   CHOL 212 (H) 01/04/2016   TRIG 181 (H) 01/04/2016   HDL 74 01/04/2016   CHOLHDL 2.9 01/04/2016   VLDL 36 (H) 01/04/2016   LDLCALC 102 (H) 01/04/2016   LDLCALC 115 09/15/2014     Current Medications: Current Outpatient Prescriptions  Medication Sig Dispense Refill  . Biotin (BIOTIN 5000) 5 MG CAPS  Take 1 capsule by mouth daily.    . clonazePAM (KLONOPIN) 0.5 MG tablet Take 1 tablet (0.5 mg total) by mouth 2 (two) times daily as needed for anxiety. Take half bid prn 45 tablet 0  . fluorometholone (FML) 0.1 % ophthalmic suspension Place 1 drop into both eyes daily as needed (DRY EYES).   1  . FLUoxetine (PROZAC) 20 MG tablet TAKE 2 TABLETS EVERY DAY 180 tablet 0  . folic acid (FOLVITE) 800 MCG tablet Take 800 mcg by mouth daily.      Marland Kitchen GLUCOSA-CHONDR-NA CHONDR-MSM PO Take 1,500 mg by mouth daily.    . Magnesium 100 MG CAPS Take 100 mg by mouth 2 (two) times daily.    . Methylsulfonylmethane (MSM) 1000 MG CAPS Take 1 capsule by mouth daily.      . Multiple Vitamin (MULTIVITAMIN) tablet Take 1 tablet by mouth daily.     . vitamin E 400 UNIT capsule Take 400  Units by mouth daily.      Marland Kitchen YAZ 3-0.02 MG tablet TAKE 1 TABLET BY MOUTH DAILY. 28 tablet 10   No current facility-administered medications for this visit.       Psychiatric Specialty Exam: Review of Systems  Cardiovascular: Negative for palpitations.  Gastrointestinal: Negative for nausea.  Musculoskeletal: Negative for neck pain.  Skin: Negative for rash.  Psychiatric/Behavioral: Negative for substance abuse and suicidal ideas.    Blood pressure 120/72, pulse 83, resp. rate 16, height  (1.575 m), weight 141 lb (64 kg), SpO2 98 %.Body mass index is 25.79 kg/m.  General Appearance: Casual  Eye Contact:  Fair  Speech:  Normal Rate  Volume:  Decreased  Mood:  Somewhat stressed  Affect:  congruent  Thought Process:  Goal Directed  Orientation:  Full (Time, Place, and Person)  Thought Content:  Rumination  Suicidal Thoughts:  No  Homicidal Thoughts:  No  Memory:  Immediate;   Fair Recent;   Fair  Judgement:  Fair  Insight:  Fair  Psychomotor Activity:  Decreased  Concentration:  Concentration: Fair and Attention Span: Fair  Recall:  [employer] of Knowledge:Fair  Language: Fair  Akathisia:  Negative  Handed:  Right  AIMS (if indicated):    Assets:  Desire for Improvement  ADL's:  Intact  Cognition: WNL  Sleep:  poor    Treatment Plan Summary: Medication management and Plan as follows   GAD and panic symptoms: fluctuates. Takes klonopine prn. Can continue . Continue prozac. Stop effexor not taking   OCD: checking on things slow her down but she is trying to manage. Med hellps. Will continue prozac Depression: not worse. Continue prozac Spends time with mom that hellps. Continue to develop coping skills for stress.  Provided supportive therapy  FU 2-3 months. Can call for prozac. It was filled 90 days thsi month   Thresa Ross, MD 9/25/20184:16 PM

## 2017-01-13 ENCOUNTER — Other Ambulatory Visit: Payer: Self-pay

## 2017-01-13 ENCOUNTER — Encounter (HOSPITAL_COMMUNITY): Payer: Self-pay | Admitting: Psychiatry

## 2017-01-13 ENCOUNTER — Ambulatory Visit (HOSPITAL_COMMUNITY): Payer: 59 | Admitting: Psychiatry

## 2017-01-13 VITALS — BP 110/72 | HR 87 | Ht 62.0 in | Wt 142.0 lb

## 2017-01-13 DIAGNOSIS — Z818 Family history of other mental and behavioral disorders: Secondary | ICD-10-CM | POA: Diagnosis not present

## 2017-01-13 DIAGNOSIS — G4700 Insomnia, unspecified: Secondary | ICD-10-CM

## 2017-01-13 DIAGNOSIS — F411 Generalized anxiety disorder: Secondary | ICD-10-CM

## 2017-01-13 DIAGNOSIS — Z79899 Other long term (current) drug therapy: Secondary | ICD-10-CM | POA: Diagnosis not present

## 2017-01-13 DIAGNOSIS — F429 Obsessive-compulsive disorder, unspecified: Secondary | ICD-10-CM

## 2017-01-13 DIAGNOSIS — F5102 Adjustment insomnia: Secondary | ICD-10-CM

## 2017-01-13 DIAGNOSIS — F410 Panic disorder [episodic paroxysmal anxiety] without agoraphobia: Secondary | ICD-10-CM

## 2017-01-13 MED ORDER — FLUOXETINE HCL 20 MG PO TABS
40.0000 mg | ORAL_TABLET | Freq: Every day | ORAL | 0 refills | Status: DC
Start: 2017-01-13 — End: 2017-04-15

## 2017-01-13 NOTE — Progress Notes (Signed)
[facility]   Patient Identification: Frances ProwsLabrina M Ho MRN:  [id number] Date of Evaluation:  01/13/2017 Referral Source: Primary care Chief Complaint:   Chief Complaint    Follow-up; Other     Visit Diagnosis:  OCD GAD Insomnia History of Present Illness:  46 years old currently single Caucasian female who is living with her mom. Referred initially by primary care physician for management of sleep, anxiety  and OCD  Working full time now. Has some inattention.  prozac helping but wants some more for inattention. Then realize she has anxiety and it may get worse.  Seldom takes klonopine now  Timing: later part of day more feel subdued  Aggravating factor:job stress  Modifying factor: home, family She continues therapy to work on Pharmacologistcoping skills   No psychosis     Past Medical History:  Past Medical History:  Diagnosis Date  . Anxiety   . Chronic headaches   . Depression   . Diastolic dysfunction 03/12/2016  . Endometriosis   . Genital warts   . Heart murmur   . Mitral valve prolapse   . OSA (obstructive sleep apnea) 07/30/2010  . Tachycardia     Past Surgical History:  Procedure Laterality Date  . COLPOSCOPY  3/09   w/ECC-neg  . FOOT SURGERY  2005   left  . ovarian cyst removed    . PELVIC LAPAROSCOPY Bilateral 11/13/03   laparoscopic removal bilateral endometriomas    Family Psychiatric History: Mom : depression and anxiety  Family History:  Family History  Problem Relation Age of Onset  . Skin cancer Mother   . Diabetes Unknown        grandfather  . Hypertension Unknown        grandfather/grandmother  . Heart disease Maternal Grandmother   . Heart disease Maternal Grandfather   . Heart disease Paternal Grandmother   . Heart disease Paternal Grandfather     Social History:   Social History   Socioeconomic History  . Marital status: Single    Spouse name: None  . Number of children: 0  . Years of education: None  . Highest  education level: None  Social Needs  . Financial resource strain: None  . Food insecurity - worry: None  . Food insecurity - inability: None  . Transportation needs - medical: None  . Transportation needs - non-medical: None  Occupational History  . 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 Smoking status: Never Smoker  . Smokeless tobacco: Never Used  Substance and Sexual Activity  . Alcohol use: No  . Drug use: No  . Sexual activity: Yes    Partners: Male    Birth control/protection: Pill    Comment: yaz  Other Topics Concern  . None  Social History Narrative   Pt is single and lives with family.      Allergies:  No Known Allergies  Metabolic Disorder Labs: No results found for: HGBA1C, MPG No results found for: PROLACTIN Lab Results  Component Value Date   CHOL 212 (H) 01/04/2016   TRIG 181 (H) 01/04/2016   HDL 74 01/04/2016   CHOLHDL 2.9 01/04/2016   VLDL 36 (H) 01/04/2016   LDLCALC 102 (H) 01/04/2016   LDLCALC 115 09/15/2014     Current Medications: Current Outpatient Medications  Medication Sig Dispense Refill  . Biotin (BIOTIN 5000) 5 MG CAPS Take 1 capsule by mouth daily.    . clonazePAM (KLONOPIN) 0.5 MG tablet Take 1 tablet (  0.5 mg total) by mouth 2 (two) times daily as needed for anxiety. Take half bid prn 45 tablet 0  . fluorometholone (FML) 0.1 % ophthalmic suspension Place 1 drop into both eyes daily as needed (DRY EYES).   1  . FLUoxetine (PROZAC) 20 MG tablet Take 2 tablets (40 mg total) by mouth daily. 180 tablet 0  . folic acid (FOLVITE) 800 MCG tablet Take 800 mcg by mouth daily.      Marland Kitchen. GLUCOSA-CHONDR-NA CHONDR-MSM PO Take 1,500 mg by mouth daily.    . Magnesium 100 MG CAPS Take 100 mg by mouth 2 (two) times daily.    . Methylsulfonylmethane (MSM) 1000 MG CAPS Take 1 capsule by mouth daily.      . Multiple Vitamin (MULTIVITAMIN) tablet Take 1 tablet by mouth daily.     Marland Kitchen. YAZ 3-0.02 MG tablet TAKE 1 TABLET BY MOUTH DAILY. 28 tablet  10  . vitamin E 400 UNIT capsule Take 400 Units by mouth daily.       No current facility-administered medications for this visit.       Psychiatric Specialty Exam: Review of Systems  Cardiovascular: Negative for chest pain.  Gastrointestinal: Negative for nausea.  Musculoskeletal: Negative for neck pain.  Skin: Negative for rash.  Psychiatric/Behavioral: Negative for depression, substance abuse and suicidal ideas.    Blood pressure 110/72, pulse 87, height 5\' 2"  (1.575 m), weight 142 lb (64.4 kg).Body mass index is 25.97 kg/m.  General Appearance: Casual  Eye Contact:  Fair  Speech:  Normal Rate  Volume:  Decreased  Mood:  fair  Affect:  congruent  Thought Process:  Goal Directed  Orientation:  Full (Time, Place, and Person)  Thought Content:  Rumination  Suicidal Thoughts:  No  Homicidal Thoughts:  No  Memory:  Immediate;   Fair Recent;   Fair  Judgement:  Fair  Insight:  Fair  Psychomotor Activity:  Decreased  Concentration:  Concentration: Fair and Attention Span: Fair  Recall:  [employer]Fair  Fund of Knowledge:Fair  Language: Fair  Akathisia:  Negative  Handed:  Right  AIMS (if indicated):    Assets:  Desire for Improvement  ADL's:  Intact  Cognition: WNL  Sleep:  poor    Treatment Plan Summary: Medication management and Plan as follows   GAD and panic symptoms: less intense. Continue prozac    OCD: checking on things slow . Not worse. Continue prozac  Depression: not worse. Continue prozac Would avoid stimulant med as she may gets anxious FU 3 months. Renewed meds  Thresa RossNadeem Mathayus Stanbery, MD 12/18/20184:19 PM

## 2017-02-09 ENCOUNTER — Other Ambulatory Visit: Payer: Self-pay | Admitting: Obstetrics & Gynecology

## 2017-02-09 DIAGNOSIS — Z1231 Encounter for screening mammogram for malignant neoplasm of breast: Secondary | ICD-10-CM

## 2017-03-23 ENCOUNTER — Ambulatory Visit
Admission: RE | Admit: 2017-03-23 | Discharge: 2017-03-23 | Disposition: A | Discharge status: Home / Self Care | Payer: 59 | Source: Ambulatory Visit | Attending: Obstetrics & Gynecology | Admitting: Obstetrics & Gynecology

## 2017-03-23 DIAGNOSIS — Z1231 Encounter for screening mammogram for malignant neoplasm of breast: Secondary | ICD-10-CM

## 2017-03-26 ENCOUNTER — Telehealth: Payer: Self-pay | Admitting: Cardiology

## 2017-03-26 DIAGNOSIS — I340 Nonrheumatic mitral (valve) insufficiency: Secondary | ICD-10-CM

## 2017-03-26 DIAGNOSIS — I341 Nonrheumatic mitral (valve) prolapse: Secondary | ICD-10-CM

## 2017-03-26 NOTE — Telephone Encounter (Signed)
Left message for patient per Dr.Turner will schedule for echo on the same day as upcoming OV. Order placed to be scheduled. Patient made aware that schedulers will call to make appt.

## 2017-03-26 NOTE — Telephone Encounter (Signed)
Left message to call back  

## 2017-03-26 NOTE — Telephone Encounter (Signed)
Spoke with patient, she would like to know if she needs an echo done this year if so she would like schedule same day as her upcoming annaul OV appt. [place] Informed patient I would send to Dr. Mayford Knifeurner for review. Patient last echo on 01/2015. Patient verbalized understanding and thankful for the call.

## 2017-03-26 NOTE — Telephone Encounter (Signed)
Yes she will need an echo for MVP and MR

## 2017-03-26 NOTE — Telephone Encounter (Signed)
New Message   Pt wants to schedule her echo the same day as her ov that she has yearly due to how far she stays and no order in epic. Please call

## 2017-04-14 ENCOUNTER — Ambulatory Visit (HOSPITAL_COMMUNITY): Payer: Self-pay | Admitting: Psychiatry

## 2017-04-15 ENCOUNTER — Telehealth (HOSPITAL_COMMUNITY): Payer: Self-pay

## 2017-04-15 ENCOUNTER — Encounter: Payer: Self-pay | Admitting: Obstetrics & Gynecology

## 2017-04-15 ENCOUNTER — Other Ambulatory Visit (HOSPITAL_COMMUNITY): Payer: Self-pay

## 2017-04-15 MED ORDER — FLUOXETINE HCL 20 MG PO TABS: 40.0000 mg | ORAL_TABLET | Freq: Every day | ORAL | 0 refills | Status: DC

## 2017-04-15 NOTE — Progress Notes (Signed)
47 y.o. G0P0 SingleCaucasianF here for annual exam.  Seeing Dr. Gilmore Laroche at [facility].  On Prozac and Clonazepam.    Patient's last menstrual period was 04/02/2017.          Sexually active: No.  The current method of family planning is OCP (estrogen/progesterone).    Exercising: Yes.    walking Smoker:  no  Health Maintenance: Pap:  09-15-14 neg, 01-04-16 neg HPV HR neg History of abnormal Pap:  Yes 2009 MMG:  03-23-17 category c density birads 1:neg SBE: pt checks breast Colonoscopy:  None.  D/w pt new guidelines.   BMD:   none TDaP:  2010 Pneumonia vaccine(s):  Not done Shingrix:   Not done Hep C testing: not done Screening Labs: done today, Urine today: neg   reports that she has never smoked. She has never used smokeless tobacco. She reports that she does not drink alcohol or use drugs.  Past Medical History:  Diagnosis Date  . Abnormal Pap smear of cervix 2009  . Anxiety   . Chronic headaches   . Depression   . Diastolic dysfunction 03/12/2016  . Endometriosis   . Genital warts   . Heart murmur   . Mitral valve prolapse   . OCD (obsessive compulsive disorder)   . OSA (obstructive sleep apnea) 07/30/2010  . Tachycardia     Past Surgical History:  Procedure Laterality Date  . COLPOSCOPY  3/09   w/ECC-neg  . FOOT SURGERY  2005   left  . ovarian cyst removed    . PELVIC LAPAROSCOPY Bilateral 11/13/03   laparoscopic removal bilateral endometriomas    Current Outpatient Medications  Medication Sig Dispense Refill  . B Complex Vitamins (B COMPLEX PO) Take by mouth.    Marland Kitchen BIOTIN PO Take 1,000 mg by mouth.    . clonazePAM (KLONOPIN) 0.5 MG tablet Take 1 tablet (0.5 mg total) by mouth 2 (two) times daily as needed for anxiety. Take half bid prn 45 tablet 0  . fluorometholone (FML) 0.1 % ophthalmic suspension Place 1 drop into both eyes daily as needed (DRY EYES).   1  . FLUoxetine (PROZAC) 20 MG tablet Take 2 tablets (40 mg total) by mouth daily. 180 tablet 0  .  GLUCOSA-CHONDR-NA CHONDR-MSM PO Take 1,500 mg by mouth daily.    . Magnesium 100 MG CAPS Take 100 mg by mouth 2 (two) times daily.    . Methylsulfonylmethane (MSM) 1000 MG CAPS Take 1 capsule by mouth daily.      . Multiple Vitamin (MULTIVITAMIN) tablet Take 1 tablet by mouth daily.     Marland Kitchen YAZ 3-0.02 MG tablet TAKE 1 TABLET BY MOUTH DAILY. 28 tablet 10   No current facility-administered medications for this visit.     Family History  Problem Relation Age of Onset  . Skin cancer Mother   . Diabetes Unknown        grandfather  . Hypertension Unknown        grandfather/grandmother  . Heart disease Maternal Grandmother   . Heart disease Maternal Grandfather   . Heart disease Paternal Grandmother   . Heart disease Paternal Grandfather     Review of Systems  Skin:       Hair thinning    Exam:   BP 100/64   Pulse 68   Resp 16   Ht 5\' 2"  (1.575 m)   Wt 142 lb (64.4 kg)   LMP 04/02/2017   BMI 25.97 kg/m   Height:   Height: 5'  2" (157.5 cm)  Ht Readings from Last 3 Encounters:  04/16/17 5\' 2"  (1.575 m)  03/12/16 5\' 2"  (1.575 m)  01/04/16 5\' 2"  (1.575 m)    General appearance: alert, cooperative and appears stated age Head: Normocephalic, without obvious abnormality, atraumatic Neck: no adenopathy, supple, symmetrical, trachea midline and thyroid normal to inspection and palpation Lungs: clear to auscultation bilaterally Breasts: normal appearance, no masses or tenderness Heart: regular rate and rhythm Abdomen: soft, non-tender; bowel sounds normal; no masses,  no organomegaly Extremities: extremities normal, atraumatic, no cyanosis or edema Skin: Skin color, texture, turgor normal. No rashes or lesions Lymph nodes: Cervical, supraclavicular, and axillary nodes normal. No abnormal inguinal nodes palpated Neurologic: Grossly normal   Pelvic: External genitalia:  no lesions              Urethra:  normal appearing urethra with no masses, tenderness or lesions               Bartholins and Skenes: normal                 Vagina: normal appearing vagina with normal color and discharge, no lesions              Cervix: no lesions              Pap taken: Yes.   Bimanual Exam:  Uterus:  not enlarged but fixed in pelvis, no discrete masses noted              Adnexa: no mass, fullness, tenderness               Rectovaginal: Confirms               Anus:  normal sphincter tone, no lesions  Pt declined chaperone.    A:  Well Woman with normal exam Mitral valve prolapse, followed by Dr. Mayford Knifeurner H/O HR HPV with neg pap.  Pap with neg HR HPV 2014 and 12/17.  Desires pap today.  Aware HR HPV not needed. Hair thinning (brought recommendations for lab work from another provider she is seeing) Remote hx of genital warts Anxiety/depression hx, [facility] H/O endometriosis  P:   Mammogram guidelines reviewed.  Doing 3D due to breast density pap smear obtained today IFOB given and new colorectal cancer screening guidelines reviewed Rx for Yaz, brand only, to pharmacy.  Pt desires 2 month supply.  #2 packges/6RF CMP, CBC, HbA1C, TSH, Ferritin, total testosterone return annually or prn

## 2017-04-15 NOTE — Telephone Encounter (Signed)
Pharmacy sent over a fax requesting a 90 day refill on Fluoxetine 20mg . Sent over a refill for 90 days. Patients' next ov is on 04/28/17. Nothing further is needed at this time.

## 2017-04-16 ENCOUNTER — Ambulatory Visit (INDEPENDENT_AMBULATORY_CARE_PROVIDER_SITE_OTHER): Payer: 59 | Admitting: Obstetrics & Gynecology

## 2017-04-16 ENCOUNTER — Other Ambulatory Visit: Payer: Self-pay

## 2017-04-16 ENCOUNTER — Other Ambulatory Visit (HOSPITAL_COMMUNITY)
Admission: RE | Admit: 2017-04-16 | Discharge: 2017-04-16 | Disposition: A | Discharge status: Home / Self Care | Payer: 59 | Source: Ambulatory Visit | Attending: Obstetrics & Gynecology | Admitting: Obstetrics & Gynecology

## 2017-04-16 ENCOUNTER — Encounter: Payer: Self-pay | Admitting: Obstetrics & Gynecology

## 2017-04-16 VITALS — BP 100/64 | HR 68 | Resp 16 | Ht 62.0 in | Wt 142.0 lb

## 2017-04-16 DIAGNOSIS — L659 Nonscarring hair loss, unspecified: Secondary | ICD-10-CM

## 2017-04-16 DIAGNOSIS — Z124 Encounter for screening for malignant neoplasm of cervix: Secondary | ICD-10-CM | POA: Insufficient documentation

## 2017-04-16 DIAGNOSIS — Z01419 Encounter for gynecological examination (general) (routine) without abnormal findings: Secondary | ICD-10-CM

## 2017-04-16 DIAGNOSIS — Z0000 Encounter for general adult medical examination without abnormal findings: Secondary | ICD-10-CM | POA: Diagnosis not present

## 2017-04-16 DIAGNOSIS — Z1211 Encounter for screening for malignant neoplasm of colon: Secondary | ICD-10-CM

## 2017-04-16 LAB — POCT URINALYSIS DIPSTICK
BILIRUBIN UA: NEGATIVE
Blood, UA: NEGATIVE
Glucose, UA: NEGATIVE
KETONES UA: NEGATIVE
Leukocytes, UA: NEGATIVE
NITRITE UA: NEGATIVE
PROTEIN UA: NEGATIVE
UROBILINOGEN UA: NEGATIVE U/dL — AB
pH, UA: 5 (ref 5.0–8.0)

## 2017-04-16 MED ORDER — YAZ 3-0.02 MG PO TABS: 1.0000 | ORAL_TABLET | Freq: Every day | ORAL | 7 refills | Status: DC

## 2017-04-17 LAB — CYTOLOGY - PAP: Diagnosis: NEGATIVE

## 2017-04-21 LAB — COMPREHENSIVE METABOLIC PANEL
ALBUMIN: 4.5 g/dL (ref 3.5–5.5)
ALK PHOS: 80 IU/L (ref 39–117)
ALT: 21 IU/L (ref 0–32)
AST: 25 IU/L (ref 0–40)
Albumin/Globulin Ratio: 1.4 (ref 1.2–2.2)
BILIRUBIN TOTAL: 0.3 mg/dL (ref 0.0–1.2)
BUN / CREAT RATIO: 16 (ref 9–23)
BUN: 14 mg/dL (ref 6–24)
CHLORIDE: 98 mmol/L (ref 96–106)
CO2: 21 mmol/L (ref 20–29)
Calcium: 9.3 mg/dL (ref 8.7–10.2)
Creatinine, Ser: 0.89 mg/dL (ref 0.57–1.00)
GFR calc non Af Amer: 79 mL/min/{1.73_m2} (ref >59)
GFR, EST AFRICAN AMERICAN: 91 mL/min/{1.73_m2} (ref >59)
GLOBULIN, TOTAL: 3.3 g/dL (ref 1.5–4.5)
GLUCOSE: 81 mg/dL (ref 65–99)
POTASSIUM: 4.3 mmol/L (ref 3.5–5.2)
SODIUM: 139 mmol/L (ref 134–144)
TOTAL PROTEIN: 7.8 g/dL (ref 6.0–8.5)

## 2017-04-21 LAB — CBC
HEMOGLOBIN: 14.4 g/dL (ref 11.1–15.9)
Hematocrit: 42.7 % (ref 34.0–46.6)
MCH: 30.7 pg (ref 26.6–33.0)
MCHC: 33.7 g/dL (ref 31.5–35.7)
MCV: 91 fL (ref 79–97)
Platelets: 276 10*3/uL (ref 150–379)
RBC: 4.69 x10E6/uL (ref 3.77–5.28)
RDW: 13.2 % (ref 12.3–15.4)
WBC: 4.8 10*3/uL (ref 3.4–10.8)

## 2017-04-21 LAB — TSH: TSH: 2.17 u[IU]/mL (ref 0.450–4.500)

## 2017-04-21 LAB — TESTOSTERONE, TOTAL, LC/MS/MS: Testosterone, total: 18.2 ng/dL

## 2017-04-21 LAB — HEMOGLOBIN A1C
Est. average glucose Bld gHb Est-mCnc: 103 mg/dL
HEMOGLOBIN A1C: 5.2 % (ref 4.8–5.6)

## 2017-04-21 LAB — FERRITIN: Ferritin: 49 ng/mL (ref 15–150)

## 2017-04-27 ENCOUNTER — Telehealth: Payer: Self-pay | Admitting: *Deleted

## 2017-04-27 NOTE — Telephone Encounter (Signed)
LM for pt to call back.

## 2017-04-27 NOTE — Telephone Encounter (Signed)
Notes recorded by Loreta AveMorales, Shizuo Biskup C, CMA on 04/27/2017 at 10:12 AM EDT Pt notified. Verbalized understanding.

## 2017-04-27 NOTE — Telephone Encounter (Signed)
-----   Message from Jerene BearsMary S Miller, MD sent at 04/27/2017  7:38 AM EDT ----- Please let pt know her total cholesterol was 234 and triglycerides were 170.  LDL was 125.  Ok to watch.  Should repeat fasting yearly.

## 2017-04-28 ENCOUNTER — Encounter (HOSPITAL_COMMUNITY): Payer: Self-pay | Admitting: Psychiatry

## 2017-04-28 ENCOUNTER — Ambulatory Visit (HOSPITAL_COMMUNITY): Payer: 59 | Admitting: Psychiatry

## 2017-04-28 VITALS — BP 124/80 | HR 82 | Ht 62.0 in | Wt 145.0 lb

## 2017-04-28 DIAGNOSIS — F411 Generalized anxiety disorder: Secondary | ICD-10-CM

## 2017-04-28 DIAGNOSIS — F410 Panic disorder [episodic paroxysmal anxiety] without agoraphobia: Secondary | ICD-10-CM

## 2017-04-28 DIAGNOSIS — F429 Obsessive-compulsive disorder, unspecified: Secondary | ICD-10-CM | POA: Diagnosis not present

## 2017-04-28 DIAGNOSIS — Z818 Family history of other mental and behavioral disorders: Secondary | ICD-10-CM

## 2017-04-28 DIAGNOSIS — F5102 Adjustment insomnia: Secondary | ICD-10-CM

## 2017-04-28 NOTE — Progress Notes (Signed)
[facility]   Patient Identification: Frances ProwsLabrina M Ho MRN:  [id number] Date of Evaluation:  04/28/2017 Referral Source: Primary care Chief Complaint:   Chief Complaint    Follow-up; Other     Visit Diagnosis:  OCD GAD Insomnia History of Present Illness:  47 years old currently single Caucasian female who is living with her mom. Referred initially by primary care physician for management of sleep, anxiety  and OCD  Work is going fair.  Feels lonely Working on plasma treatment for hair loss put 34oo dollars now worried if its gonna work. It makes her subdued   Seldom takes klonopine now  Timing: later part of day more feel subdued  Aggravating factor:hair loss, job stress Modifying factor: home,family  She continues therapy to work on Pharmacologistcoping skills   No psychosis     Past Medical History:  Past Medical History:  Diagnosis Date  . Abnormal Pap smear of cervix 2009  . Anxiety   . Chronic headaches   . Depression   . Diastolic dysfunction 03/12/2016  . Endometriosis   . Genital warts   . Heart murmur   . Mitral valve prolapse   . OCD (obsessive compulsive disorder)   . OSA (obstructive sleep apnea) 07/30/2010  . Tachycardia     Past Surgical History:  Procedure Laterality Date  . COLPOSCOPY  3/09   w/ECC-neg  . FOOT SURGERY  2005   left  . ovarian cyst removed    . PELVIC LAPAROSCOPY Bilateral 11/13/03   laparoscopic removal bilateral endometriomas    Family Psychiatric History: Mom : depression and anxiety  Family History:  Family History  Problem Relation Age of Onset  . Skin cancer Mother   . Diabetes Unknown        grandfather  . Hypertension Unknown        grandfather/grandmother  . Heart disease Maternal Grandmother   . Heart disease Maternal Grandfather   . Heart disease Paternal Grandmother   . Heart disease Paternal Grandfather     Social History:   Social History   Socioeconomic History  . Marital status: Single     Spouse name: Not on file  . Number of children: 0  . Years of education: Not on file  . Highest education level: Not on file  Occupational History  . 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 Financial resource strain: Not on file  . Food insecurity:    Worry: Not on file    Inability: Not on file  . Transportation needs:    Medical: Not on file    Non-medical: Not on file  Tobacco Use  . Smoking status: Never Smoker  . Smokeless tobacco: Never Used  Substance and Sexual Activity  . Alcohol use: No  . Drug use: No  . Sexual activity: Not Currently    Partners: Male    Birth control/protection: Pill    Comment: yaz  Lifestyle  . Physical activity:    Days per week: Not on file    Minutes per session: Not on file  . Stress: Not on file  Relationships  . Social connections:    Talks on phone: Not on file    Gets together: Not on file    Attends religious service: Not on file    Active member of club or organization: Not on file    Attends meetings of clubs or organizations: Not on file    Relationship status: Not on file  Other Topics Concern  . Not on file  Social History Narrative   Pt is single and lives with family.      Allergies:  No Known Allergies  Metabolic Disorder Labs: Lab Results  Component Value Date   HGBA1C 5.2 04/16/2017   No results found for: PROLACTIN Lab Results  Component Value Date   CHOL 234 (H) 04/16/2017   TRIG 170 (H) 04/16/2017   HDL 75 04/16/2017   CHOLHDL 3.1 04/16/2017   VLDL 36 (H) 01/04/2016   LDLCALC 125 (H) 04/16/2017   LDLCALC 102 (H) 01/04/2016     Current Medications: Current Outpatient Medications  Medication Sig Dispense Refill  . B Complex Vitamins (B COMPLEX PO) Take by mouth.    Marland Kitchen BIOTIN PO Take 1,000 mg by mouth.    . clonazePAM (KLONOPIN) 0.5 MG tablet Take 1 tablet (0.5 mg total) by mouth 2 (two) times daily as needed for anxiety. Take half bid prn 45 tablet 0  . fluorometholone (FML) 0.1  % ophthalmic suspension Place 1 drop into both eyes daily as needed (DRY EYES).   1  . FLUoxetine (PROZAC) 20 MG tablet Take 2 tablets (40 mg total) by mouth daily. 180 tablet 0  . GLUCOSA-CHONDR-NA CHONDR-MSM PO Take 1,500 mg by mouth daily.    . Magnesium 100 MG CAPS Take 100 mg by mouth 2 (two) times daily.    . Methylsulfonylmethane (MSM) 1000 MG CAPS Take 1 capsule by mouth daily.      . Multiple Vitamin (MULTIVITAMIN) tablet Take 1 tablet by mouth daily.     Marland Kitchen YAZ 3-0.02 MG tablet Take 1 tablet by mouth daily. 2 Package 7   No current facility-administered medications for this visit.       Psychiatric Specialty Exam: Review of Systems  Cardiovascular: Negative for palpitations.  Gastrointestinal: Negative for nausea.  Musculoskeletal: Negative for neck pain.  Skin: Negative for rash.  Psychiatric/Behavioral: Negative for substance abuse and suicidal ideas.    Blood pressure 124/80, pulse 82, height 5\' 2"  (1.575 m), weight 145 lb (65.8 kg), last menstrual period 04/02/2017.Body mass index is 26.52 kg/m.  General Appearance: Casual  Eye Contact:  Fair  Speech:  Normal Rate  Volume:  Decreased  Mood: subdued  Affect:  congruent  Thought Process:  Goal Directed  Orientation:  Full (Time, Place, and Person)  Thought Content:  Rumination  Suicidal Thoughts:  No  Homicidal Thoughts:  No  Memory:  Immediate;   Fair Recent;   Fair  Judgement:  Fair  Insight:  Fair  Psychomotor Activity:  Decreased  Concentration:  Concentration: Fair and Attention Span: Fair  Recall:  [employer] of Knowledge:Fair  Language: Fair  Akathisia:  Negative  Handed:  Right  AIMS (if indicated):    Assets:  Desire for Improvement  ADL's:  Intact  Cognition: WNL  Sleep:  poor    Treatment Plan Summary: Medication management and Plan as follows   GAD and panic symptoms:worries about her looks. Job stress is better  continue prozac. Consider therapy and exercises     OCD: checking on  things not worse. Continue prozac  Depression: subdued. Feels low at times. Continue prozac. Take 40mg  together  Reviewed meds. Provided supportive and cognitive therapy. Fu 1 -2 m.  Thresa Ross, MD 4/2/20194:34 PM

## 2017-04-29 LAB — LIPID PANEL
CHOL/HDL RATIO: 3.1 ratio (ref 0.0–4.4)
Cholesterol, Total: 234 mg/dL — ABNORMAL HIGH (ref 100–199)
HDL: 75 mg/dL (ref >39)
LDL Calculated: 125 mg/dL — ABNORMAL HIGH (ref 0–99)
TRIGLYCERIDES: 170 mg/dL — AB (ref 0–149)
VLDL CHOLESTEROL CAL: 34 mg/dL (ref 5–40)

## 2017-04-29 LAB — SPECIMEN STATUS REPORT

## 2017-05-07 ENCOUNTER — Encounter: Payer: Self-pay | Admitting: Cardiology

## 2017-05-08 ENCOUNTER — Encounter: Payer: Self-pay | Admitting: Cardiology

## 2017-05-14 ENCOUNTER — Other Ambulatory Visit (HOSPITAL_COMMUNITY): Payer: Self-pay

## 2017-05-14 ENCOUNTER — Other Ambulatory Visit: Payer: Self-pay

## 2017-05-14 ENCOUNTER — Ambulatory Visit: Payer: Self-pay | Admitting: Cardiology

## 2017-05-14 ENCOUNTER — Ambulatory Visit: Payer: 59 | Admitting: Cardiology

## 2017-05-14 ENCOUNTER — Ambulatory Visit (HOSPITAL_COMMUNITY): Payer: 59 | Attending: Cardiology

## 2017-05-14 ENCOUNTER — Encounter: Payer: Self-pay | Admitting: Cardiology

## 2017-05-14 VITALS — BP 114/72 | HR 75 | Ht 62.0 in | Wt 145.0 lb

## 2017-05-14 DIAGNOSIS — R079 Chest pain, unspecified: Secondary | ICD-10-CM | POA: Diagnosis not present

## 2017-05-14 DIAGNOSIS — I341 Nonrheumatic mitral (valve) prolapse: Secondary | ICD-10-CM

## 2017-05-14 DIAGNOSIS — I340 Nonrheumatic mitral (valve) insufficiency: Secondary | ICD-10-CM | POA: Insufficient documentation

## 2017-05-14 DIAGNOSIS — F410 Panic disorder [episodic paroxysmal anxiety] without agoraphobia: Secondary | ICD-10-CM | POA: Insufficient documentation

## 2017-05-14 DIAGNOSIS — G4733 Obstructive sleep apnea (adult) (pediatric): Secondary | ICD-10-CM | POA: Diagnosis not present

## 2017-05-14 NOTE — Progress Notes (Signed)
Cardiology Office Note:    Date:  05/14/2017   ID:  Frances Ho, DOB January 25, 1971, MRN [id number]  PCP:  Patient, No Pcp Per  Cardiologist:  No primary care provider on file.    Referring MD: No ref. provider found   Chief Complaint  Patient presents with  . Mitral Regurgitation    History of Present Illness:    Frances Ho is a 47 y.o. 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  She is here today for followup and is doing well.  She denies any chest pain or pressure, SOB, DOE, PND, orthopnea, LE edema, dizziness, palpitations or syncope. She is compliant with her meds and is tolerating meds with no SE.    Past Medical History:  Diagnosis Date  . Abnormal Pap smear of cervix 2009  . Anxiety   . Chronic headaches   . Depression   . Diastolic dysfunction 03/12/2016  . Endometriosis   . Genital warts   . Heart murmur   . Mitral valve prolapse   . OCD (obsessive compulsive disorder)   . OSA (obstructive sleep apnea) 07/30/2010  . Tachycardia     Past Surgical History:  Procedure Laterality Date  . COLPOSCOPY  3/09   w/ECC-neg  . FOOT SURGERY  2005   left  . ovarian cyst removed    . PELVIC LAPAROSCOPY Bilateral 11/13/03   laparoscopic removal bilateral endometriomas    Current Medications: Current Meds  Medication Sig  . B Complex Vitamins (B COMPLEX PO) Take by mouth.  Marland Kitchen. BIOTIN PO Take 1,000 mg by mouth.  . clonazePAM (KLONOPIN) 0.5 MG tablet Take 1 tablet (0.5 mg total) by mouth 2 (two) times daily as needed for anxiety. Take half bid prn  . fluorometholone (FML) 0.1 % ophthalmic suspension Place 1 drop into both eyes daily as needed (DRY EYES).   Marland Kitchen. FLUoxetine (PROZAC) 20 MG tablet Take 2 tablets (40 mg total) by mouth daily.  Marland Kitchen. GLUCOSA-CHONDR-NA CHONDR-MSM PO Take 1,500 mg by mouth daily.  . Magnesium 100 MG CAPS Take 100 mg by mouth 2 (two) times daily.  . Methylsulfonylmethane (MSM) 1000 MG CAPS Take 1 capsule by mouth daily.    .  Multiple Vitamin (MULTIVITAMIN) tablet Take 1 tablet by mouth daily.   Marland Kitchen. YAZ 3-0.02 MG tablet Take 1 tablet by mouth daily.     Allergies:   Patient has no known allergies.   Social History   Socioeconomic History  . Marital status: Single    Spouse name: Not on file  . Number of children: 0  . Years of education: Not on file  . Highest education level: Not on file  Occupational History  . 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 Financial resource strain: Not on file  . Food insecurity:    Worry: Not on file    Inability: Not on file  . Transportation needs:    Medical: Not on file    Non-medical: Not on file  Tobacco Use  . Smoking status: Never Smoker  . Smokeless tobacco: Never Used  Substance and Sexual Activity  . Alcohol use: No  . Drug use: No  . Sexual activity: Not Currently    Partners: Male    Birth control/protection: Pill    Comment: yaz  Lifestyle  . Physical activity:    Days per week: Not on file    Minutes per session: Not on file  . Stress: Not  on file  Relationships  . Social connections:    Talks on phone: Not on file    Gets together: Not on file    Attends religious service: Not on file    Active member of club or organization: Not on file    Attends meetings of clubs or organizations: Not on file    Relationship status: Not on file  Other Topics Concern  . Not on file  Social History Narrative   Pt is single and lives with family.      Family History: The patient's family history includes Diabetes in her unknown relative; Heart disease in her maternal grandfather, maternal grandmother, paternal grandfather, and paternal grandmother; Hypertension in her unknown relative; Skin cancer in her mother.  ROS:   Please see the history of present illness.    ROS  All other systems reviewed and negative.   EKGs/Labs/Other Studies Reviewed:    The following studies were reviewed today: none  EKG:  EKG is  ordered today.  The  ekg ordered today demonstrates NSR at 75bpm with no ST changes  Recent Labs: 04/16/2017: ALT 21; BUN 14; Creatinine, Ser 0.89; Hemoglobin 14.4; Platelets 276; Potassium 4.3; Sodium 139; TSH 2.170   Recent Lipid Panel    Component Value Date/Time   CHOL 234 (H) 04/16/2017 1204   TRIG 170 (H) 04/16/2017 1204   HDL 75 04/16/2017 1204   CHOLHDL 3.1 04/16/2017 1204   CHOLHDL 2.9 01/04/2016 1410   VLDL 36 (H) 01/04/2016 1410   LDLCALC 125 (H) 04/16/2017 1204    Physical Exam:    VS:  BP 114/72   Pulse 75   Ht 5\' 2"  (1.575 m)   Wt 145 lb (65.8 kg)   SpO2 99%   BMI 26.52 kg/m     Wt Readings from Last 3 Encounters:  05/14/17 145 lb (65.8 kg)  04/16/17 142 lb (64.4 kg)  03/12/16 143 lb 6.4 oz (65 kg)     GEN:  Well nourished, well developed in no acute distress HEENT: Normal NECK: No JVD; No carotid bruits LYMPHATICS: No lymphadenopathy CARDIAC: RRR, no murmurs, rubs, gallops RESPIRATORY:  Clear to auscultation without rales, wheezing or rhonchi  ABDOMEN: Soft, non-tender, non-distended MUSCULOSKELETAL:  No edema; No deformity  SKIN: Warm and dry NEUROLOGIC:  Alert and oriented x 3 PSYCHIATRIC:  Normal affect   ASSESSMENT:    1. Mitral valve prolapse   2. Chest pain, unspecified type   3. Panic attacks    PLAN:    In order of problems listed above:  1.  Mitral valve prolapse with mitral regurgitation.  The echocardiogram 02/07/2015 showed normal LV function EF 55-60%.  There is no evidence of mitral valve prolapse with the patient did have mild mitral regurgitation.  She had a repeat 2D echocardiogram today with results pending.  2.  Chest pain -she has been complaining of episodic chest pain that is somewhat atypical.  She describes as an aching sensation but it usually occurs with anxiety or stress.  She will notice it some when she is lying down.  She never has a discomfort when she exerts herself.  She is concerned because she does have a family history of coronary  disease with all of her grandparents.  She has no associated symptoms of shortness of breath nausea or diaphoresis with the chest pain and it usually only lasts a few minutes.  She has it a few times a month.  EKG today is normal.  She had an  exercise trouble test a year ago that was normal.  I am going to set her up for a stress echo to rule out inducible ischemia.  3.  Anxiety and panic attacks - Likely part of the etiology of her chest pain.  She is currently seeing a psychiatrist currently battling anxiety, OCD, ADD, panic attacks.  Medication Adjustments/Labs and Tests Ordered: Current medicines are reviewed at length with the patient today.  Concerns regarding medicines are outlined above.  Orders Placed This Encounter  Procedures  . EKG 12-Lead   No orders of the defined types were placed in this encounter.   Signed, Armanda Magic, MD  05/14/2017 1:07 PM    [facility]

## 2017-05-14 NOTE — Patient Instructions (Addendum)
Medication Instructions:  Your physician recommends that you continue on your current medications as directed. Please refer to the Current Medication list given to you today.  If you need a refill on your cardiac medications, please contact your pharmacy first.  Labwork: None ordered   Testing/Procedures: Your physician has requested that you have a stress echocardiogram. For further information please visit [email or web address]. Please follow instruction sheet as given.  Patient stated she would like to hold off now. Stated she would call back to schedule   Follow-Up: Your physician wants you to follow-up as needed with Dr. Mayford Knifeurner   Any Other Special Instructions Will Be Listed Below (If Applicable).   [facility]    Lyda PeroneRena Lomax Poehler, RN  [phone]  If you need a refill on your cardiac medications before your next appointment, please call your pharmacy.

## 2017-05-16 ENCOUNTER — Encounter: Payer: Self-pay | Admitting: Cardiology

## 2017-06-04 ENCOUNTER — Other Ambulatory Visit (HOSPITAL_COMMUNITY): Payer: Self-pay

## 2017-06-04 MED ORDER — FLUOXETINE HCL 20 MG PO TABS: 40.0000 mg | ORAL_TABLET | Freq: Every day | ORAL | 0 refills | Status: DC

## 2017-06-08 ENCOUNTER — Telehealth (HOSPITAL_COMMUNITY): Payer: Self-pay

## 2017-06-08 NOTE — Telephone Encounter (Signed)
Patient is requesting a 90 day supply for Fluoxetine  tabs. Pharmacy is [facility] in [place]. Please advise

## 2017-06-09 NOTE — Telephone Encounter (Signed)
Called and left VM informing patient that a 90 day supply was sent in on last Thursday 06/04/17.

## 2017-06-25 ENCOUNTER — Encounter: Payer: Self-pay | Admitting: Obstetrics & Gynecology

## 2017-07-06 ENCOUNTER — Telehealth (HOSPITAL_COMMUNITY): Payer: Self-pay

## 2017-07-06 NOTE — Telephone Encounter (Signed)
Patient states Klonopin makes her drowsy. She wants to know if it is anything else she can take. Please review and advise.

## 2017-07-07 NOTE — Telephone Encounter (Signed)
Called and left vm informing patient to give me a call back and let me know if she wants to add the Buspar 5mg  per Dr. Gilmore LarocheAkhtar.

## 2017-07-07 NOTE — Telephone Encounter (Signed)
Left VM informing patient per Dr. Gilmore LarocheAkhtar to take 1/2 dose or less only at night.

## 2017-07-07 NOTE — Telephone Encounter (Signed)
Can add buspar 5mg  during the day. Can send if agrees

## 2017-07-07 NOTE — Telephone Encounter (Signed)
Patient called back stating that she is aware of the 1/2 dose but she has sleep apnea and can not take it at night. She states she needs something to take in the daytime. Please review and advise.

## 2017-07-07 NOTE — Telephone Encounter (Signed)
Can lower the dose to half or less and take only night time

## 2017-07-09 ENCOUNTER — Telehealth (HOSPITAL_COMMUNITY): Payer: Self-pay

## 2017-07-09 MED ORDER — BUSPIRONE HCL 5 MG PO TABS: 5.0000 mg | ORAL_TABLET | Freq: Every day | ORAL | 0 refills | Status: DC

## 2017-07-09 NOTE — Telephone Encounter (Signed)
Informed patient that she can take Buspar as needed. Sent 30 supply to pharmacy.

## 2017-07-09 NOTE — Telephone Encounter (Signed)
Patient called stating she did some research on Buspar and found out that it must be taken everyday for it to work properly. Patient wants to know if she needs to take it everyday because she doesn't need it everyday, just as needed. She states if she does need to take it everyday then she would like to know if there are anymore options.

## 2017-07-09 NOTE — Telephone Encounter (Signed)
Can take if need basis

## 2017-07-13 ENCOUNTER — Telehealth: Payer: Self-pay | Admitting: Obstetrics & Gynecology

## 2017-07-13 LAB — FECAL OCCULT BLOOD, IMMUNOCHEMICAL: IMMUNOLOGICAL FECAL OCCULT BLOOD TEST: NEGATIVE

## 2017-07-13 NOTE — Telephone Encounter (Signed)
IFob 06/24/17, negative. Call returned to patient, advised of results. Patient verbalizes understanding.   Routing to provider for final review. Patient is agreeable to disposition. Will close encounter.

## 2017-07-13 NOTE — Addendum Note (Signed)
Addended by: Alphonsa OverallIXON, AMANDA L on: 07/13/2017 02:47 PM   Modules accepted: Orders

## 2017-07-13 NOTE — Telephone Encounter (Signed)
Patient is asking if we have the results from her iFob kit that she mailed to [place] over three weeks ago?

## 2017-08-05 ENCOUNTER — Other Ambulatory Visit (HOSPITAL_COMMUNITY): Payer: Self-pay | Admitting: Psychiatry

## 2017-08-06 ENCOUNTER — Ambulatory Visit (HOSPITAL_COMMUNITY): Payer: Self-pay | Admitting: Psychiatry

## 2017-08-13 ENCOUNTER — Encounter (HOSPITAL_COMMUNITY): Payer: Self-pay | Admitting: Psychiatry

## 2017-08-13 ENCOUNTER — Ambulatory Visit (HOSPITAL_COMMUNITY): Payer: 59 | Admitting: Psychiatry

## 2017-08-13 VITALS — BP 124/80 | HR 92 | Ht 62.0 in | Wt 144.0 lb

## 2017-08-13 DIAGNOSIS — F410 Panic disorder [episodic paroxysmal anxiety] without agoraphobia: Secondary | ICD-10-CM

## 2017-08-13 DIAGNOSIS — F411 Generalized anxiety disorder: Secondary | ICD-10-CM | POA: Diagnosis not present

## 2017-08-13 DIAGNOSIS — F429 Obsessive-compulsive disorder, unspecified: Secondary | ICD-10-CM | POA: Diagnosis not present

## 2017-08-13 MED ORDER — FLUOXETINE HCL 20 MG PO TABS: 40.0000 mg | ORAL_TABLET | Freq: Every day | ORAL | 0 refills | Status: DC

## 2017-08-13 MED ORDER — BUSPIRONE HCL 5 MG PO TABS: 5.0000 mg | ORAL_TABLET | Freq: Every day | ORAL | 1 refills | Status: DC

## 2017-08-13 NOTE — Progress Notes (Signed)
[facility] Outpatient Follow up visit   Patient Identification: Frances Ho MRN:  [id number] Date of Evaluation:  08/13/2017 Referral Source: Primary care Chief Complaint:   Chief Complaint    Follow-up; Other     Visit Diagnosis:  OCD GAD Insomnia History of Present Illness:  47 years old currently single Caucasian female who is living with her mom. Referred initially by primary care physician for management of sleep, anxiety  and OCD  Work is stressful. More 1-1 with supervisors makes her tense Feels lonely  Worried about germs when checking at job    Seldom takes klonopine now  Timing: later part of day more feel subdued  Aggravating factor:hair loss,  Modifying factor: home,family  She continues therapy to work on Pharmacologist   No psychosis     Past Medical History:  Past Medical History:  Diagnosis Date  . Abnormal Pap smear of cervix 2009  . Anxiety   . Chronic headaches   . Depression   . Diastolic dysfunction 03/12/2016  . Endometriosis   . Genital warts   . Heart murmur   . Mitral valve prolapse    mild bileaflet MVP with trivial MR by echo 04/2017  . OCD (obsessive compulsive disorder)   . OSA (obstructive sleep apnea) 07/30/2010  . Tachycardia     Past Surgical History:  Procedure Laterality Date  . COLPOSCOPY  3/09   w/ECC-neg  . FOOT SURGERY  2005   left  . ovarian cyst removed    . PELVIC LAPAROSCOPY Bilateral 11/13/03   laparoscopic removal bilateral endometriomas    Family Psychiatric History: Mom : depression and anxiety  Family History:  Family History  Problem Relation Age of Onset  . Skin cancer Mother   . Diabetes Unknown        grandfather  . Hypertension Unknown        grandfather/grandmother  . Heart disease Maternal Grandmother   . Heart disease Maternal Grandfather   . Heart disease Paternal Grandmother   . Heart disease Paternal Grandfather     Social History:   Social History   Socioeconomic History  . Marital  status: Single    Spouse name: Not on file  . Number of children: 0  . Years of education: Not on file  . Highest education level: Not on file  Occupational History  . 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 Financial resource strain: Not on file  . Food insecurity:    Worry: Not on file    Inability: Not on file  . Transportation needs:    Medical: Not on file    Non-medical: Not on file  Tobacco Use  . Smoking status: Never Smoker  . Smokeless tobacco: Never Used  Substance and Sexual Activity  . Alcohol use: No  . Drug use: No  . Sexual activity: Not Currently    Partners: Male    Birth control/protection: Pill    Comment: yaz  Lifestyle  . Physical activity:    Days per week: Not on file    Minutes per session: Not on file  . Stress: Not on file  Relationships  . Social connections:    Talks on phone: Not on file    Gets together: Not on file    Attends religious service: Not on file    Active member of club or organization: Not on file    Attends meetings of clubs or organizations: Not on file    Relationship status:  Not on file  Other Topics Concern  . Not on file  Social History Narrative   Pt is single and lives with family.      Allergies:  No Known Allergies  Metabolic Disorder Labs: Lab Results  Component Value Date   HGBA1C 5.2 04/16/2017   No results found for: PROLACTIN Lab Results  Component Value Date   CHOL 234 (H) 04/16/2017   TRIG 170 (H) 04/16/2017   HDL 75 04/16/2017   CHOLHDL 3.1 04/16/2017   VLDL 36 (H) 01/04/2016   LDLCALC 125 (H) 04/16/2017   LDLCALC 102 (H) 01/04/2016     Current Medications: Current Outpatient Medications  Medication Sig Dispense Refill  . B Complex Vitamins (B COMPLEX PO) Take by mouth.    Marland Kitchen. BIOTIN PO Take 1,000 mg by mouth.    . busPIRone (BUSPAR) 5 MG tablet Take 1 tablet (5 mg total) by mouth daily. 30 tablet 1  . fluorometholone (FML) 0.1 % ophthalmic suspension Place 1 drop into both  eyes daily as needed (DRY EYES).   1  . FLUoxetine (PROZAC) 20 MG tablet Take 2 tablets (40 mg total) by mouth daily. 180 tablet 0  . GLUCOSA-CHONDR-NA CHONDR-MSM PO Take 1,500 mg by mouth daily.    . Magnesium 100 MG CAPS Take 100 mg by mouth 2 (two) times daily.    . Methylsulfonylmethane (MSM) 1000 MG CAPS Take 1 capsule by mouth daily.      . Multiple Vitamin (MULTIVITAMIN) tablet Take 1 tablet by mouth daily.     Marland Kitchen. YAZ 3-0.02 MG tablet Take 1 tablet by mouth daily. 2 Package 7   No current facility-administered medications for this visit.       Psychiatric Specialty Exam: Review of Systems  Cardiovascular: Negative for chest pain.  Gastrointestinal: Negative for nausea.  Musculoskeletal: Negative for neck pain.  Skin: Negative for rash.  Psychiatric/Behavioral: Negative for substance abuse and suicidal ideas.    Blood pressure 124/80, pulse 92, height 5\' 2"  (1.575 m), weight 144 lb (65.3 kg).Body mass index is 26.34 kg/m.  General Appearance: Casual  Eye Contact:  Fair  Speech:  Normal Rate  Volume:  Decreased  Mood: subdued  Affect:  congruent  Thought Process:  Goal Directed  Orientation:  Full (Time, Place, and Person)  Thought Content:  Rumination  Suicidal Thoughts:  No  Homicidal Thoughts:  No  Memory:  Immediate;   Fair Recent;   Fair  Judgement:  Fair  Insight:  Fair  Psychomotor Activity:  Decreased  Concentration:  Concentration: Fair and Attention Span: Fair  Recall:  [employer]is[employer]Fair  Fund of Knowledge:Fair  Language: Fair  Akathisia:  Negative  Handed:  Right  AIMS (if indicated):    Assets:  Desire for Improvement  ADL's:  Intact  Cognition: WNL  Sleep:  poor    Treatment Plan Summary: Medication management and Plan as follows   GAD and panic symptoms:stressed, buspar has helped but taking half. Increase to 5mg  and regular   continue prozac. Consider therapy and exercises     OCD: gets worse. Continue prozac and therapy or  distraction  Depression: subdued. Feels low at times. Continue prozac. Take 40mg  together continue buspar   Reviewed meds. Provided supportive and cognitive therapy. Fu 1 -2 m.  Thresa RossNadeem Brynna Dobos, MD 7/18/20194:29 PM

## 2017-08-21 ENCOUNTER — Other Ambulatory Visit (HOSPITAL_COMMUNITY): Payer: Self-pay

## 2017-08-21 MED ORDER — BUSPIRONE HCL 5 MG PO TABS: 5.0000 mg | ORAL_TABLET | Freq: Every day | ORAL | 0 refills | Status: DC

## 2017-09-21 ENCOUNTER — Other Ambulatory Visit (HOSPITAL_COMMUNITY): Payer: Self-pay

## 2017-09-21 ENCOUNTER — Telehealth (HOSPITAL_COMMUNITY): Payer: Self-pay

## 2017-09-21 MED ORDER — FLUOXETINE HCL 20 MG PO TABS: 40.0000 mg | ORAL_TABLET | Freq: Every day | ORAL | 0 refills | Status: DC

## 2017-09-21 NOTE — Telephone Encounter (Signed)
Pt states Buspar is making her feel very hungry. Please review and advise

## 2017-09-22 NOTE — Telephone Encounter (Signed)
Its a small dose of buspar. Can cut down to half for now and evaluate

## 2017-09-22 NOTE — Telephone Encounter (Signed)
Left a VM informing patient that per Dr. Gilmore LarocheAkhtar she is taking a small dose of buspar but she can take half dose and see how that makes her feel.

## 2017-10-22 DIAGNOSIS — I341 Nonrheumatic mitral (valve) prolapse: Secondary | ICD-10-CM | POA: Insufficient documentation

## 2017-11-10 ENCOUNTER — Ambulatory Visit (HOSPITAL_COMMUNITY): Payer: Self-pay | Admitting: Psychiatry

## 2017-12-01 ENCOUNTER — Ambulatory Visit (HOSPITAL_COMMUNITY): Payer: 59 | Admitting: Psychiatry

## 2017-12-01 ENCOUNTER — Encounter (HOSPITAL_COMMUNITY): Payer: Self-pay | Admitting: Psychiatry

## 2017-12-01 VITALS — BP 110/74 | HR 96 | Ht 62.0 in | Wt 147.0 lb

## 2017-12-01 DIAGNOSIS — F429 Obsessive-compulsive disorder, unspecified: Secondary | ICD-10-CM | POA: Diagnosis not present

## 2017-12-01 DIAGNOSIS — F410 Panic disorder [episodic paroxysmal anxiety] without agoraphobia: Secondary | ICD-10-CM | POA: Diagnosis not present

## 2017-12-01 DIAGNOSIS — F411 Generalized anxiety disorder: Secondary | ICD-10-CM

## 2017-12-01 MED ORDER — HYDROXYZINE HCL 10 MG PO TABS: 10.0000 mg | ORAL_TABLET | Freq: Two times a day (BID) | ORAL | 0 refills | Status: DC | PRN

## 2017-12-01 MED ORDER — FLUOXETINE HCL 20 MG PO TABS: 40.0000 mg | ORAL_TABLET | Freq: Every day | ORAL | 0 refills | Status: DC

## 2017-12-01 NOTE — Progress Notes (Signed)
[facility] Outpatient Follow up visit   Patient Identification: DAWSON HOLLMAN MRN:  [id number] Date of Evaluation:  12/01/2017 Referral Source: Primary care Chief Complaint:   Chief Complaint    Follow-up     Visit Diagnosis:  OCD GAD Insomnia History of Present Illness:  47 years old currently single Caucasian female who is living with her mom. Referred initially by primary care physician for management of sleep, anxiety  and OCD Feels anxious going to work but then it gets better  Taking steroids for hair loss says there was a condition after biopsy results they are treating  Stopped buspar says would eat more after it Walking more  Tolerating prozac  Feels lonely    Seldom takes klonopine now   Aggravating factor:hair loss,  lonliness Modifying factor: home, family  She continues therapy to work on Pharmacologist   No psychosis     Past Medical History:  Past Medical History:  Diagnosis Date  . Abnormal Pap smear of cervix 2009  . Anxiety   . Chronic headaches   . Depression   . Diastolic dysfunction 03/12/2016  . Endometriosis   . Genital warts   . Heart murmur   . Mitral valve prolapse    mild bileaflet MVP with trivial MR by echo 04/2017  . OCD (obsessive compulsive disorder)   . OSA (obstructive sleep apnea) 07/30/2010  . Tachycardia     Past Surgical History:  Procedure Laterality Date  . COLPOSCOPY  3/09   w/ECC-neg  . FOOT SURGERY  2005   left  . ovarian cyst removed    . PELVIC LAPAROSCOPY Bilateral 11/13/03   laparoscopic removal bilateral endometriomas    Family Psychiatric History: Mom : depression and anxiety  Family History:  Family History  Problem Relation Age of Onset  . Skin cancer Mother   . Diabetes Unknown        grandfather  . Hypertension Unknown        grandfather/grandmother  . Heart disease Maternal Grandmother   . Heart disease Maternal Grandfather   . Heart disease Paternal Grandmother   . Heart disease Paternal  Grandfather     Social History:   Social History   Socioeconomic History  . Marital status: Single    Spouse name: Not on file  . Number of children: 0  . Years of education: Not on file  . Highest education level: Not on file  Occupational History  . 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 Financial resource strain: Not on file  . Food insecurity:    Worry: Not on file    Inability: Not on file  . Transportation needs:    Medical: Not on file    Non-medical: Not on file  Tobacco Use  . Smoking status: Never Smoker  . Smokeless tobacco: Never Used  Substance and Sexual Activity  . Alcohol use: No  . Drug use: No  . Sexual activity: Not Currently    Partners: Male    Birth control/protection: Pill    Comment: yaz  Lifestyle  . Physical activity:    Days per week: Not on file    Minutes per session: Not on file  . Stress: Not on file  Relationships  . Social connections:    Talks on phone: Not on file    Gets together: Not on file    Attends religious service: Not on file    Active member of club or organization: Not on file  Attends meetings of clubs or organizations: Not on file    Relationship status: Not on file  Other Topics Concern  . Not on file  Social History Narrative   Pt is single and lives with family.      Allergies:  No Known Allergies  Metabolic Disorder Labs: Lab Results  Component Value Date   HGBA1C 5.2 04/16/2017   No results found for: PROLACTIN Lab Results  Component Value Date   CHOL 234 (H) 04/16/2017   TRIG 170 (H) 04/16/2017   HDL 75 04/16/2017   CHOLHDL 3.1 04/16/2017   VLDL 36 (H) 01/04/2016   LDLCALC 125 (H) 04/16/2017   LDLCALC 102 (H) 01/04/2016     Current Medications: Current Outpatient Medications  Medication Sig Dispense Refill  . B Complex Vitamins (B COMPLEX PO) Take by mouth.    Marland Kitchen BIOTIN PO Take 1,000 mg by mouth.    Marland Kitchen FLUoxetine (PROZAC) 20 MG tablet Take 2 tablets (40 mg total) by mouth  daily. 180 tablet 0  . GLUCOSA-CHONDR-NA CHONDR-MSM PO Take 1,500 mg by mouth daily.    . Magnesium 100 MG CAPS Take 100 mg by mouth 2 (two) times daily.    . Methylsulfonylmethane (MSM) 1000 MG CAPS Take 1 capsule by mouth daily.      . Multiple Vitamin (MULTIVITAMIN) tablet Take 1 tablet by mouth daily.     Marland Kitchen YAZ 3-0.02 MG tablet Take 1 tablet by mouth daily. 2 Package 7  . fluorometholone (FML) 0.1 % ophthalmic suspension Place 1 drop into both eyes daily as needed (DRY EYES).   1  . hydrOXYzine (ATARAX/VISTARIL) 10 MG tablet Take 1 tablet (10 mg total) by mouth 2 (two) times daily as needed. 30 tablet 0   No current facility-administered medications for this visit.       Psychiatric Specialty Exam: Review of Systems  Cardiovascular: Negative for palpitations.  Gastrointestinal: Negative for nausea.  Musculoskeletal: Negative for neck pain.  Skin: Negative for rash.  Psychiatric/Behavioral: Negative for substance abuse and suicidal ideas.    Blood pressure 110/74, pulse 96, height 5\' 2"  (1.575 m), weight 147 lb (66.7 kg), SpO2 97 %.Body mass index is 26.89 kg/m.  General Appearance: Casual  Eye Contact:  Fair  Speech:  Normal Rate  Volume:  Decreased  Mood: fair  Affect:  congruent  Thought Process:  Goal Directed  Orientation:  Full (Time, Place, and Person)  Thought Content:  Rumination  Suicidal Thoughts:  No  Homicidal Thoughts:  No  Memory:  Immediate;   Fair Recent;   Fair  Judgement:  Fair  Insight:  Fair  Psychomotor Activity:  Decreased  Concentration:  Concentration: Fair and Attention Span: Fair  Recall:  [employer] of Knowledge:Fair  Language: Fair  Akathisia:  Negative  Handed:  Right  AIMS (if indicated):    Assets:  Desire for Improvement  ADL's:  Intact  Cognition: WNL  Sleep:  poor    Treatment Plan Summary: Medication management and Plan as follows   GAD and panic symptoms: fluctuates change to vistaril 10mg  qd prn for anxiety. Stop  buspar   continue prozac. Consider therapy and exercises     OCD: manageable continue prozac  Depression: not worse. Continue prozac   Reviewed meds. Provided supportive and cognitive therapy. Fu 1 -2 m.  Thresa Ross, MD 11/5/20194:14 PM

## 2018-01-05 ENCOUNTER — Other Ambulatory Visit (HOSPITAL_COMMUNITY): Payer: Self-pay

## 2018-01-05 MED ORDER — FLUOXETINE HCL 20 MG PO TABS: 40.0000 mg | ORAL_TABLET | Freq: Every day | ORAL | 0 refills | Status: DC

## 2018-01-05 NOTE — Progress Notes (Signed)
Patient never picked up medication because [facility] did not have it in their system. Sent [facility] to [facility] [place].

## 2018-02-24 ENCOUNTER — Other Ambulatory Visit: Payer: Self-pay | Admitting: Obstetrics & Gynecology

## 2018-02-24 DIAGNOSIS — Z1231 Encounter for screening mammogram for malignant neoplasm of breast: Secondary | ICD-10-CM

## 2018-03-31 ENCOUNTER — Ambulatory Visit
Admission: RE | Admit: 2018-03-31 | Discharge: 2018-03-31 | Disposition: A | Discharge status: Home / Self Care | Payer: 59 | Source: Ambulatory Visit | Attending: Obstetrics & Gynecology | Admitting: Obstetrics & Gynecology

## 2018-03-31 DIAGNOSIS — Z1231 Encounter for screening mammogram for malignant neoplasm of breast: Secondary | ICD-10-CM

## 2018-04-15 ENCOUNTER — Ambulatory Visit (HOSPITAL_COMMUNITY): Payer: 59 | Admitting: Psychiatry

## 2018-04-17 ENCOUNTER — Other Ambulatory Visit (HOSPITAL_COMMUNITY): Payer: Self-pay | Admitting: Psychiatry

## 2018-04-22 ENCOUNTER — Ambulatory Visit (HOSPITAL_COMMUNITY): Payer: Self-pay | Admitting: Psychiatry

## 2018-06-07 ENCOUNTER — Other Ambulatory Visit: Payer: Self-pay | Admitting: Obstetrics & Gynecology

## 2018-06-07 NOTE — Telephone Encounter (Signed)
Medication refill request: Yaz Last AEX:  04/16/17 SM Next AEX: 06/24/18 Last MMG (if hormonal medication request): 03/31/18 BIRADS 1 negative/density c Refill authorized: Please advise; Order pended #2packs w/ 0 refills if authorized

## 2018-06-17 ENCOUNTER — Other Ambulatory Visit (HOSPITAL_COMMUNITY): Payer: Self-pay | Admitting: Psychiatry

## 2018-06-17 ENCOUNTER — Other Ambulatory Visit (HOSPITAL_COMMUNITY): Payer: Self-pay

## 2018-06-17 MED ORDER — FLUOXETINE HCL 20 MG PO TABS: 40.0000 mg | ORAL_TABLET | Freq: Every day | ORAL | 0 refills | Status: DC

## 2018-06-24 ENCOUNTER — Ambulatory Visit: Payer: 59 | Admitting: Obstetrics & Gynecology

## 2018-09-21 ENCOUNTER — Ambulatory Visit: Payer: Self-pay | Admitting: Cardiology

## 2018-09-23 ENCOUNTER — Other Ambulatory Visit: Payer: Self-pay | Admitting: Obstetrics & Gynecology

## 2018-09-23 NOTE — Telephone Encounter (Signed)
Medication refill request: Yaz Last AEX:  04-16-17 SM  Next AEX: 12-14-2018 Last MMG (if hormonal medication request): 03-31-2018 density C/BIRADS 1 negative  Refill authorized: Today, please advise.   Medication pended for #84, 0RF. Please refill if appropriate.

## 2018-10-01 ENCOUNTER — Ambulatory Visit: Payer: 59 | Admitting: Obstetrics & Gynecology

## 2018-11-03 ENCOUNTER — Ambulatory Visit: Payer: Self-pay | Admitting: Cardiology

## 2018-12-10 ENCOUNTER — Encounter: Payer: Self-pay | Admitting: Cardiology

## 2018-12-10 ENCOUNTER — Other Ambulatory Visit: Payer: Self-pay

## 2018-12-10 ENCOUNTER — Ambulatory Visit (INDEPENDENT_AMBULATORY_CARE_PROVIDER_SITE_OTHER): Payer: Self-pay | Admitting: Cardiology

## 2018-12-10 VITALS — BP 104/68 | HR 99 | Ht 62.0 in | Wt 146.0 lb

## 2018-12-10 DIAGNOSIS — R072 Precordial pain: Secondary | ICD-10-CM

## 2018-12-10 DIAGNOSIS — F410 Panic disorder [episodic paroxysmal anxiety] without agoraphobia: Secondary | ICD-10-CM

## 2018-12-10 DIAGNOSIS — I341 Nonrheumatic mitral (valve) prolapse: Secondary | ICD-10-CM

## 2018-12-10 DIAGNOSIS — R079 Chest pain, unspecified: Secondary | ICD-10-CM

## 2018-12-10 NOTE — Progress Notes (Signed)
Cardiology Office Note:    Date:  12/10/2018   ID:  Frances Ho, DOB August 26, 1970, MRN [id number]  PCP:  Patient, No Pcp Per  Cardiologist:  No primary care provider on file.    Referring MD: No ref. provider found   Chief Complaint  Patient presents with  . Mitral Regurgitation    History of Present Illness:    Frances Ho is a 48 y.o. 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  She is here today for followup and is doing well.  She denies any chest pain or pressure, SOB, DOE, PND, orthopnea, LE edema, dizziness, palpitations or syncope. She is compliant with her meds and is tolerating meds with no SE.    Past Medical History:  Diagnosis Date  . Abnormal Pap smear of cervix 2009  . Anxiety   . Chronic headaches   . Depression   . Diastolic dysfunction 9/62/8366  . Endometriosis   . Genital warts   . Heart murmur   . Mitral valve prolapse    mild bileaflet MVP with trivial MR by echo 04/2017  . OCD (obsessive compulsive disorder)   . OSA (obstructive sleep apnea) 07/30/2010  . Tachycardia     Past Surgical History:  Procedure Laterality Date  . COLPOSCOPY  3/09   w/ECC-neg  . FOOT SURGERY  2005   left  . ovarian cyst removed    . PELVIC LAPAROSCOPY Bilateral 11/13/03   laparoscopic removal bilateral endometriomas    Current Medications: Current Meds  Medication Sig  . B Complex Vitamins (B COMPLEX PO) Take by mouth.  Marland Kitchen BIOTIN PO Take 1,000 mg by mouth.  . fluorometholone (FML) 0.1 % ophthalmic suspension Place 1 drop into both eyes daily as needed (DRY EYES).   Marland Kitchen FLUoxetine (PROZAC) 20 MG tablet Take 2 tablets (40 mg total) by mouth daily.  Marland Kitchen GLUCOSA-CHONDR-NA CHONDR-MSM PO Take 1,500 mg by mouth daily.  . Magnesium 100 MG CAPS Take 100 mg by mouth 2 (two) times daily.  . Methylsulfonylmethane (MSM) 1000 MG CAPS Take 1 capsule by mouth daily.    . Multiple Vitamin (MULTIVITAMIN) tablet Take 1 tablet by mouth daily.   Marland Kitchen YAZ 3-0.02  MG tablet TAKE 1 TABLET BY MOUTH EVERY DAY     Allergies:   Patient has no known allergies.   Social History   Socioeconomic History  . Marital status: Single    Spouse name: Not on file  . Number of children: 0  . Years of education: Not on file  . Highest education level: Not on file  Occupational History  . 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 Financial resource strain: Not on file  . Food insecurity    Worry: Not on file    Inability: Not on file  . Transportation needs    Medical: Not on file    Non-medical: Not on file  Tobacco Use  . Smoking status: Never Smoker  . Smokeless tobacco: Never Used  Substance and Sexual Activity  . Alcohol use: No  . Drug use: No  . Sexual activity: Not Currently    Partners: Male    Birth control/protection: Pill    Comment: yaz  Lifestyle  . Physical activity    Days per week: Not on file    Minutes per session: Not on file  . Stress: Not on file  Relationships  . Social Herbalist on phone: Not  on file    Gets together: Not on file    Attends religious service: Not on file    Active member of club or organization: Not on file    Attends meetings of clubs or organizations: Not on file    Relationship status: Not on file  Other Topics Concern  . Not on file  Social History Narrative   Pt is single and lives with family.      Family History: The patient's family history includes Diabetes in an other family member; Heart disease in her maternal grandfather, maternal grandmother, paternal grandfather, and paternal grandmother; Hypertension in an other family member; Skin cancer in her mother.  ROS:   Please see the history of present illness.    ROS  All other systems reviewed and negative.   EKGs/Labs/Other Studies Reviewed:    The following studies were reviewed today:   EKG:  EKG is notordered today.   Recent Labs: No results found for requested labs within last 8760 hours.   Recent  Lipid Panel    Component Value Date/Time   CHOL 234 (H) 04/16/2017 1204   TRIG 170 (H) 04/16/2017 1204   HDL 75 04/16/2017 1204   CHOLHDL 3.1 04/16/2017 1204   CHOLHDL 2.9 01/04/2016 1410   VLDL 36 (H) 01/04/2016 1410   LDLCALC 125 (H) 04/16/2017 1204    Physical Exam:    VS:  BP 104/68   Pulse 99   Ht 5\' 2"  (1.575 m)   Wt 146 lb (66.2 kg)   SpO2 98%   BMI 26.70 kg/m     Wt Readings from Last 3 Encounters:  12/10/18 146 lb (66.2 kg)  05/14/17 145 lb (65.8 kg)  04/16/17 142 lb (64.4 kg)     GEN:  Well nourished, well developed in no acute distress HEENT: Normal NECK: No JVD; No carotid bruits LYMPHATICS: No lymphadenopathy CARDIAC: RRR, no murmurs, rubs, gallops RESPIRATORY:  Clear to auscultation without rales, wheezing or rhonchi  ABDOMEN: Soft, non-tender, non-distended MUSCULOSKELETAL:  No edema; No deformity  SKIN: Warm and dry NEUROLOGIC:  Alert and oriented x 3 PSYCHIATRIC:  Normal affect   ASSESSMENT:    1. Mitral valve prolapse   2. Chest pain, unspecified type   3. Panic attacks    PLAN:    In order of problems listed above:  1.  MVP with MR -echo 2019 showed bileaflet MVP with trivial MR -repeat 2D echo in 1 year  2.  Chest pain -Stress echo showed no ischemia -she has not had any further CP  3.  Anxiety and panic attacks -following with Psychiatry  Medication Adjustments/Labs and Tests Ordered: Current medicines are reviewed at length with the patient today.  Concerns regarding medicines are outlined above.  No orders of the defined types were placed in this encounter.  No orders of the defined types were placed in this encounter.   Signed, 2020, MD  12/10/2018 3:24 PM    [facility]

## 2018-12-10 NOTE — Patient Instructions (Signed)
Medication Instructions:   Your physician recommends that you continue on your current medications as directed. Please refer to the Current Medication list given to you today.  *If you need a refill on your cardiac medications before your next appointment, please call your pharmacy*    Testing/Procedures:  CARDIAC CT SCORING-CALCIUM SCORE- DONE HERE AT OUR OFFICE--YOU CAN ASK CHECKOUT IF CT HAS AN AVAILABILITY TO DO THIS SCAN TODAY   Follow-Up: At [facility], you and your health needs are our priority.  As part of our continuing mission to provide you with exceptional heart care, we have created designated Provider Care Teams.  These Care Teams include your primary Cardiologist (physician) and Advanced Practice Providers (APPs -  Physician Assistants and Nurse Practitioners) who all work together to provide you with the care you need, when you need it.  Your next appointment:   12 months  The format for your next appointment:   In Person  Provider:   Fransico Him, MD

## 2018-12-14 ENCOUNTER — Ambulatory Visit: Payer: 59 | Admitting: Obstetrics & Gynecology

## 2018-12-22 ENCOUNTER — Other Ambulatory Visit: Payer: Self-pay | Admitting: Obstetrics & Gynecology

## 2018-12-27 NOTE — Telephone Encounter (Signed)
Medication refill request: Yaz Last AEX:  04/16/17 SM Next AEX: 12/22/18 Last MMG (if hormonal medication request): 03/31/18 BIRADS 1 negative/density c Refill authorized: Please advise on refill; order pended #84 w/0 refills if authorized

## 2018-12-28 ENCOUNTER — Other Ambulatory Visit: Payer: Self-pay | Admitting: Obstetrics & Gynecology

## 2018-12-28 ENCOUNTER — Telehealth: Payer: Self-pay

## 2018-12-28 NOTE — Telephone Encounter (Signed)
Please see refill encounter 12/28/18

## 2018-12-28 NOTE — Telephone Encounter (Signed)
Patient scheduled for aex 03/07/19 because of insurance. Requesting that refill be sent to pharmacy.

## 2018-12-28 NOTE — Telephone Encounter (Signed)
Last appt was 3/19.  She did not have appt in November.  Does not have appt scheduled.  Needs scheduling.  RF has not been completed.  I cannot refill this again if appt is cancelled.

## 2018-12-28 NOTE — Telephone Encounter (Signed)
Medication refill request: Yaz Last AEX:  04/16/17 SM Next AEX: 03/07/19 Last MMG (if hormonal medication request): 03/31/18 BIRADS 1 negative/density c Refill authorized: Please advise; patient has scheduled AEX for 03/07/19

## 2018-12-28 NOTE — Telephone Encounter (Signed)
Tried calling patient to schedule an AEX (see refill encounter 11/25). No answer, left message for patient to call our office back to schedule appointment.

## 2018-12-29 NOTE — Telephone Encounter (Signed)
Return call to patient regarding refill. Offered to move up appointment. Patient declined since is currently without insurance. Expects new plan in January.  Advised last annual March 2019 and unable to continue to extend prescription without updated exam.  Annual moved up to 01-31-19 and advised will review request with Dr Sabra Heck tomorrow.

## 2018-12-29 NOTE — Telephone Encounter (Signed)
Patient called checking status of refill request.

## 2018-12-29 NOTE — Telephone Encounter (Signed)
Patient calling for update on refill. RN advised request had been sent to Dr. Sabra Heck for review and approval. AEX is scheduled for 03-07-19. Patient upset over how long refill taking to be approved. RN advised of 48 hour policy for refills and that Dr. Sabra Heck is out of the office today and would need to review prior to prescription being sent to pharmacy.

## 2018-12-30 NOTE — Telephone Encounter (Signed)
Call to patient. Left message on voice mail that prescription sent to pharmacy.

## 2018-12-30 NOTE — Telephone Encounter (Signed)
Rx was completed.  Thanks for calling her.  Ok to close encounter.

## 2018-12-31 ENCOUNTER — Other Ambulatory Visit: Payer: Self-pay

## 2019-01-14 ENCOUNTER — Other Ambulatory Visit: Payer: Self-pay

## 2019-01-14 ENCOUNTER — Ambulatory Visit (INDEPENDENT_AMBULATORY_CARE_PROVIDER_SITE_OTHER)
Admission: RE | Admit: 2019-01-14 | Discharge: 2019-01-14 | Disposition: A | Discharge status: Home / Self Care | Payer: Self-pay | Source: Ambulatory Visit | Attending: Cardiology | Admitting: Cardiology

## 2019-01-14 DIAGNOSIS — R079 Chest pain, unspecified: Secondary | ICD-10-CM

## 2019-01-14 DIAGNOSIS — R072 Precordial pain: Secondary | ICD-10-CM

## 2019-01-14 DIAGNOSIS — F410 Panic disorder [episodic paroxysmal anxiety] without agoraphobia: Secondary | ICD-10-CM

## 2019-01-14 DIAGNOSIS — I341 Nonrheumatic mitral (valve) prolapse: Secondary | ICD-10-CM

## 2019-01-17 ENCOUNTER — Other Ambulatory Visit (HOSPITAL_COMMUNITY): Payer: Self-pay

## 2019-01-17 MED ORDER — FLUOXETINE HCL 20 MG PO TABS: 40.0000 mg | ORAL_TABLET | Freq: Every day | ORAL | 0 refills | Status: DC

## 2019-01-25 ENCOUNTER — Ambulatory Visit (HOSPITAL_COMMUNITY): Payer: Self-pay | Admitting: Psychiatry

## 2019-01-31 ENCOUNTER — Ambulatory Visit (INDEPENDENT_AMBULATORY_CARE_PROVIDER_SITE_OTHER): Payer: BC Managed Care – PPO | Admitting: Obstetrics & Gynecology

## 2019-01-31 ENCOUNTER — Encounter: Payer: Self-pay | Admitting: Obstetrics & Gynecology

## 2019-01-31 ENCOUNTER — Other Ambulatory Visit: Payer: Self-pay

## 2019-01-31 VITALS — BP 118/72 | HR 92 | Temp 97.9°F | Resp 12 | Ht 62.0 in | Wt 145.2 lb

## 2019-01-31 DIAGNOSIS — Z0000 Encounter for general adult medical examination without abnormal findings: Secondary | ICD-10-CM

## 2019-01-31 DIAGNOSIS — Z23 Encounter for immunization: Secondary | ICD-10-CM | POA: Diagnosis not present

## 2019-01-31 DIAGNOSIS — Z01419 Encounter for gynecological examination (general) (routine) without abnormal findings: Secondary | ICD-10-CM | POA: Diagnosis not present

## 2019-01-31 MED ORDER — YAZ 3-0.02 MG PO TABS: 1.0000 | ORAL_TABLET | Freq: Every day | ORAL | 4 refills | Status: DC

## 2019-01-31 NOTE — Progress Notes (Signed)
49 y.o. G0P0 Single Other or two or more races female here for annual exam.  Has been followed by Dr. Radford Pax for mitral valve prolapse.  She did have a coronary CT scan done 01/14/2019 and this was normal.    Recently lost job and is now having to pay for insurance.  So has some concerns about costs of Yaz which she has been on for years.  Typically, does have a 3 day menstrual cycle with her Yaz.    Patient's last menstrual period was 01/25/2019.          Sexually active: No.  The current method of family planning is OCP.    Exercising: Yes.    treadmill Smoker:  no  Health Maintenance: Pap:  09-15-14 neg, 01-04-16 neg HPV HR neg History of abnormal Pap:  Yes, 2009 MMG:  03/31/18 BIRADS 1 negative/density c Colonoscopy: patient has done stool kit which was negative TDaP:  03/30/08 Screening Labs: discuss today -- patient is fasting   reports that she has never smoked. She has never used smokeless tobacco. She reports that she does not drink alcohol or use drugs.  Past Medical History:  Diagnosis Date  . Abnormal Pap smear of cervix 2009  . Anxiety   . Chronic headaches   . Depression   . Diastolic dysfunction 01/29/7251  . Endometriosis   . Genital warts   . Heart murmur   . Mitral valve prolapse    mild bileaflet MVP with trivial MR by echo 04/2017  . OCD (obsessive compulsive disorder)   . OSA (obstructive sleep apnea) 07/30/2010  . Tachycardia     Past Surgical History:  Procedure Laterality Date  . COLPOSCOPY  3/09   w/ECC-neg  . FOOT SURGERY  2005   left  . ovarian cyst removed    . PELVIC LAPAROSCOPY Bilateral 11/13/03   laparoscopic removal bilateral endometriomas    Current Outpatient Medications  Medication Sig Dispense Refill  . B Complex Vitamins (B COMPLEX PO) Take by mouth.    Marland Kitchen BIOTIN PO Take 1,000 mg by mouth.    Marland Kitchen FLUoxetine (PROZAC) 20 MG tablet Take 2 tablets (40 mg total) by mouth daily. 180 tablet 0  . GLUCOSA-CHONDR-NA CHONDR-MSM PO Take 1,500 mg by  mouth daily.    . Magnesium 100 MG CAPS Take 100 mg by mouth 2 (two) times daily.    . Methylsulfonylmethane (MSM) 1000 MG CAPS Take 1 capsule by mouth daily.      . Multiple Vitamin (MULTIVITAMIN) tablet Take 1 tablet by mouth daily.     Marland Kitchen YAZ 3-0.02 MG tablet TAKE 1 TABLET BY MOUTH EVERY DAY 56 tablet 0   No current facility-administered medications for this visit.    Family History  Problem Relation Age of Onset  . Skin cancer Mother   . Diabetes Other        grandfather  . Hypertension Other        grandfather/grandmother  . Heart disease Maternal Grandmother   . Heart disease Maternal Grandfather   . Heart disease Paternal Grandmother   . Heart disease Paternal Grandfather     Review of Systems  All other systems reviewed and are negative.   Exam:   BP 118/72 (BP Location: Left Arm, Patient Position: Sitting, Cuff Size: Normal)   Pulse 92   Temp 97.9 F (36.6 C) (Temporal)   Resp 12   Ht _0  (1.575 m)   Wt 145 lb 3.2 oz (65.9 kg)   LMP  01/25/2019   BMI 26.56 kg/m    Height: _0  (157.5 cm)  Ht Readings from Last 3 Encounters:  01/31/19 _1  (1.575 m)  12/10/18 _2  (1.575 m)  05/14/17 _3  (1.575 m)   General appearance: alert, cooperative and appears stated age Head: Normocephalic, without obvious abnormality, atraumatic Neck: no adenopathy, supple, symmetrical, trachea midline and thyroid normal to inspection and palpation Lungs: clear to auscultation bilaterally Breasts: normal appearance, no masses or tenderness Heart: regular rate and rhythm Abdomen: soft, non-tender; bowel sounds normal; no masses,  no organomegaly Extremities: extremities normal, atraumatic, no cyanosis or edema Skin: Skin color, texture, turgor normal. No rashes or lesions Lymph nodes: Cervical, supraclavicular, and axillary nodes normal. No abnormal inguinal nodes palpated Neurologic: Grossly normal   Pelvic: External genitalia:  no lesions              Urethra:  normal  appearing urethra with no masses, tenderness or lesions              Bartholins and Skenes: normal                 Vagina: normal appearing vagina with normal color and discharge, no lesions              Cervix: no lesions              Pap taken: No. Bimanual Exam:  Uterus:  normal size, contour, position, consistency, mobility, non-tender              Adnexa: normal adnexa and no mass, fullness, tenderness               Rectovaginal: Confirms               Anus:  normal sphincter tone, no lesions  Chaperone, Terence Lux, CMA, was present for exam.  A:  Well Woman with normal exam Mitral valve prolapse, followed by Dr. Radford Pax H/o HR HPV with neg pap.  Neg HR HPV 2014 and 2017 Remote hx of genital warts Anxiety/depresison hx H/o stage IV endometriosis, on OCPs  P:   Mammogram guidelines reviewed, doing 3D MMG. pap smear with neg HR HPV 2017.  New guidelines reviewed.  Not indicated today. CBC, CMP, Lipids, TSH, Vit D and HbA1C obtained today RF for Yaz, brand only, to pharmacy.  #3 month supply/4RF. Colon cancer screening guidelines reviewed Return annually or prn

## 2019-02-01 ENCOUNTER — Encounter (HOSPITAL_COMMUNITY): Payer: Self-pay | Admitting: Psychiatry

## 2019-02-01 ENCOUNTER — Ambulatory Visit (INDEPENDENT_AMBULATORY_CARE_PROVIDER_SITE_OTHER): Payer: BC Managed Care – PPO | Admitting: Psychiatry

## 2019-02-01 DIAGNOSIS — F410 Panic disorder [episodic paroxysmal anxiety] without agoraphobia: Secondary | ICD-10-CM

## 2019-02-01 DIAGNOSIS — F429 Obsessive-compulsive disorder, unspecified: Secondary | ICD-10-CM

## 2019-02-01 DIAGNOSIS — F411 Generalized anxiety disorder: Secondary | ICD-10-CM | POA: Diagnosis not present

## 2019-02-01 LAB — COMPREHENSIVE METABOLIC PANEL
ALT: 19 IU/L (ref 0–32)
AST: 24 IU/L (ref 0–40)
Albumin/Globulin Ratio: 1.5 (ref 1.2–2.2)
Albumin: 4.2 g/dL (ref 3.8–4.8)
Alkaline Phosphatase: 88 IU/L (ref 39–117)
BUN/Creatinine Ratio: 14 (ref 9–23)
BUN: 12 mg/dL (ref 6–24)
Bilirubin Total: 0.4 mg/dL (ref 0.0–1.2)
CO2: 23 mmol/L (ref 20–29)
Calcium: 8.8 mg/dL (ref 8.7–10.2)
Chloride: 101 mmol/L (ref 96–106)
Creatinine, Ser: 0.85 mg/dL (ref 0.57–1.00)
GFR calc Af Amer: 94 mL/min/{1.73_m2} (ref >59)
GFR calc non Af Amer: 82 mL/min/{1.73_m2} (ref >59)
Globulin, Total: 2.8 g/dL (ref 1.5–4.5)
Glucose: 91 mg/dL (ref 65–99)
Potassium: 3.8 mmol/L (ref 3.5–5.2)
Sodium: 137 mmol/L (ref 134–144)
Total Protein: 7 g/dL (ref 6.0–8.5)

## 2019-02-01 LAB — LIPID PANEL
Chol/HDL Ratio: 3.7 ratio (ref 0.0–4.4)
Cholesterol, Total: 243 mg/dL — ABNORMAL HIGH (ref 100–199)
HDL: 65 mg/dL (ref >39)
LDL Chol Calc (NIH): 144 mg/dL — ABNORMAL HIGH (ref 0–99)
Triglycerides: 192 mg/dL — ABNORMAL HIGH (ref 0–149)
VLDL Cholesterol Cal: 34 mg/dL (ref 5–40)

## 2019-02-01 LAB — CBC
Hematocrit: 38.5 % (ref 34.0–46.6)
Hemoglobin: 13.3 g/dL (ref 11.1–15.9)
MCH: 31.7 pg (ref 26.6–33.0)
MCHC: 34.5 g/dL (ref 31.5–35.7)
MCV: 92 fL (ref 79–97)
Platelets: 244 10*3/uL (ref 150–450)
RBC: 4.19 x10E6/uL (ref 3.77–5.28)
RDW: 11.8 % (ref 11.7–15.4)
WBC: 5.7 10*3/uL (ref 3.4–10.8)

## 2019-02-01 LAB — HEMOGLOBIN A1C
Est. average glucose Bld gHb Est-mCnc: 100 mg/dL
Hgb A1c MFr Bld: 5.1 % (ref 4.8–5.6)

## 2019-02-01 LAB — TSH: TSH: 2.28 u[IU]/mL (ref 0.450–4.500)

## 2019-02-01 LAB — VITAMIN D 25 HYDROXY (VIT D DEFICIENCY, FRACTURES): Vit D, 25-Hydroxy: 31.8 ng/mL (ref 30.0–100.0)

## 2019-02-01 NOTE — Progress Notes (Signed)
[facility] Outpatient Follow up visit   Patient Identification: Frances Ho MRN:  [id number] Date of Evaluation:  02/01/2019 Referral Source: Primary care Chief Complaint:   Follow up  Visit Diagnosis:  OCD GAD Insomnia History of Present Illness:    I connected with Omer Jack Spangler on 02/01/19 at  4:30 PM EST by telephone and verified that I am speaking with the correct person using two identifiers.   I discussed the limitations, risks, security and privacy concerns of performing an evaluation and management service by telephone and the availability of in person appointments. I also discussed with the patient that there may be a patient responsible charge related to this service. The patient expressed understanding and agreed to proceed.  49  years old currently single Caucasian female who is living with her mom. Referred initially by primary care physician for management of sleep, anxiety  and OCD  Have not been seen for more then an year. 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 She felt surprised and applied for unemployement She felt it had increased her anxiety the way she was picked to be laid off.  She kept her meds to keep balance  Takes klonopine prn as well On the other note she felt her job was stressful as well.  Did not talk about hair loss today  Tolerating prozac  Feels lonely,    Aggravating factor:hair loss,  Job loss, lonliness Modifying factor: home, family  She continues therapy to work on Radiographer, therapeutic   No psychosis     Past Medical History:  Past Medical History:  Diagnosis Date  . Abnormal Pap smear of cervix 2009  . Anxiety   . Chronic headaches   . Depression   . Diastolic dysfunction 2/56/3893  . Endometriosis   . Genital warts   . Heart murmur   . Mitral valve prolapse    mild bileaflet MVP with  trivial MR by echo 04/2017  . OCD (obsessive compulsive disorder)   . OSA (obstructive sleep apnea) 07/30/2010  . Tachycardia     Past Surgical History:  Procedure Laterality Date  . COLPOSCOPY  3/09   w/ECC-neg  . FOOT SURGERY  2005   left  . ovarian cyst removed    . PELVIC LAPAROSCOPY Bilateral 11/13/03   laparoscopic removal bilateral endometriomas    Family Psychiatric History: Mom : depression and anxiety  Family History:  Family History  Problem Relation Age of Onset  . Skin cancer Mother   . Diabetes Other        grandfather  . Hypertension Other        grandfather/grandmother  . Heart disease Maternal Grandmother   . Heart disease Maternal Grandfather   . Heart disease Paternal Grandmother   . Heart disease Paternal Grandfather     Social History:   Social History   Socioeconomic History  . Marital status: Single    Spouse name: Not on file  . Number of children: 0  . Years of education: Not on file  . Highest education level: Not on file  Occupational History  . 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 Smoking status: Never Smoker  . Smokeless tobacco: Never Used  Substance and Sexual Activity  . Alcohol use: No  . Drug use: No  . Sexual activity: Not Currently  Partners: Male    Birth control/protection: Pill    Comment: yaz  Other Topics Concern  . Not on file  Social History Narrative   Pt is single and lives with family.    Social Determinants of Health   Financial Resource Strain:   . Difficulty of Paying Living Expenses: Not on file  Food Insecurity:   . Worried About Programme researcher, broadcasting/film/video in the Last Year: Not on file  . Ran Out of Food in the Last Year: Not on file  Transportation Needs:   . Lack of Transportation (Medical): Not on file  . Lack of Transportation (Non-Medical): Not on file  Physical Activity:   . Days of Exercise per Week: Not on file  . Minutes of Exercise per Session: Not on file  Stress:   . Feeling  of Stress : Not on file  Social Connections:   . Frequency of Communication with Friends and Family: Not on file  . Frequency of Social Gatherings with Friends and Family: Not on file  . Attends Religious Services: Not on file  . Active Member of Clubs or Organizations: Not on file  . Attends Banker Meetings: Not on file  . Marital Status: Not on file     Allergies:  No Known Allergies  Metabolic Disorder Labs: Lab Results  Component Value Date   HGBA1C 5.1 01/31/2019   No results found for: PROLACTIN Lab Results  Component Value Date   CHOL 243 (H) 01/31/2019   TRIG 192 (H) 01/31/2019   HDL 65 01/31/2019   CHOLHDL 3.7 01/31/2019   VLDL 36 (H) 01/04/2016   LDLCALC 144 (H) 01/31/2019   LDLCALC 125 (H) 04/16/2017     Current Medications: Current Outpatient Medications  Medication Sig Dispense Refill  . B Complex Vitamins (B COMPLEX PO) Take by mouth.    Marland Kitchen BIOTIN PO Take 1,000 mg by mouth.    Marland Kitchen FLUoxetine (PROZAC) 20 MG tablet Take 2 tablets (40 mg total) by mouth daily. 180 tablet 0  . GLUCOSA-CHONDR-NA CHONDR-MSM PO Take 1,500 mg by mouth daily.    . Magnesium 100 MG CAPS Take 100 mg by mouth 2 (two) times daily.    . Methylsulfonylmethane (MSM) 1000 MG CAPS Take 1 capsule by mouth daily.      . Multiple Vitamin (MULTIVITAMIN) tablet Take 1 tablet by mouth daily.     Marland Kitchen YAZ 3-0.02 MG tablet Take 1 tablet by mouth daily. 84 tablet 4   No current facility-administered medications for this visit.      Psychiatric Specialty Exam: Review of Systems  Skin: Negative for rash.  Psychiatric/Behavioral: Negative for substance abuse and suicidal ideas.    Last menstrual period 01/25/2019.There is no height or weight on file to calculate BMI.  General Appearance:   Eye Contact:  Speech:  Normal Rate  Volume:  Decreased  Mood: somewhat subdued  Affect:  congruent  Thought Process:  Goal Directed  Orientation:  Full (Time, Place, and Person)  Thought  Content:  Rumination  Suicidal Thoughts:  No  Homicidal Thoughts:  No  Memory:  Immediate;   Fair Recent;   Fair  Judgement:  Fair  Insight:  Fair  Psychomotor Activity:  Decreased  Concentration:  Concentration: Fair and Attention Span: Fair  Recall:  [employer] of Knowledge:Fair  Language: Fair  Akathisia:  Negative  Handed:  Right  AIMS (if indicated):    Assets:  Desire for Improvement  ADL's:  Intact  Cognition:  WNL  Sleep:  poor    Treatment Plan Summary: Medication management and Plan as follows   GAD and panic symptoms: anxiety related to being laid off and pandemic applying unemployement  continue prozac. Consider therapy and exercises. She feels med dose is ok for now She remains upset the way she was laid off. Provided supportive therapy     OCD: fluctuates. More anxiety related to have no work  Depression: subdued, continue prozac Not suicidal  meds reviewed fu 54m or earlier   I discussed the assessment and treatment plan with the patient. The patient was provided an opportunity to ask questions and all were answered. The patient agreed with the plan and demonstrated an understanding of the instructions.   The patient was advised to call back or seek an in-person evaluation if the symptoms worsen or if the condition fails to improve as anticipated.  Thresa Ross, MD 1/5/20214:41 PM

## 2019-02-04 ENCOUNTER — Other Ambulatory Visit (HOSPITAL_COMMUNITY): Payer: Self-pay | Admitting: Psychiatry

## 2019-02-07 DIAGNOSIS — L661 Lichen planopilaris: Secondary | ICD-10-CM | POA: Diagnosis not present

## 2019-02-07 DIAGNOSIS — Z5181 Encounter for therapeutic drug level monitoring: Secondary | ICD-10-CM | POA: Diagnosis not present

## 2019-02-07 DIAGNOSIS — L658 Other specified nonscarring hair loss: Secondary | ICD-10-CM | POA: Diagnosis not present

## 2019-02-07 DIAGNOSIS — L858 Other specified epidermal thickening: Secondary | ICD-10-CM | POA: Diagnosis not present

## 2019-03-07 ENCOUNTER — Other Ambulatory Visit: Payer: Self-pay | Admitting: Obstetrics & Gynecology

## 2019-03-07 ENCOUNTER — Ambulatory Visit: Payer: Self-pay | Admitting: Obstetrics & Gynecology

## 2019-03-07 DIAGNOSIS — Z1231 Encounter for screening mammogram for malignant neoplasm of breast: Secondary | ICD-10-CM

## 2019-04-14 ENCOUNTER — Ambulatory Visit: Payer: Self-pay

## 2019-05-02 ENCOUNTER — Other Ambulatory Visit: Payer: Self-pay

## 2019-05-02 ENCOUNTER — Ambulatory Visit
Admission: RE | Admit: 2019-05-02 | Discharge: 2019-05-02 | Disposition: A | Discharge status: Home / Self Care | Payer: BC Managed Care – PPO | Source: Ambulatory Visit | Attending: Obstetrics & Gynecology | Admitting: Obstetrics & Gynecology

## 2019-05-02 DIAGNOSIS — Z1231 Encounter for screening mammogram for malignant neoplasm of breast: Secondary | ICD-10-CM

## 2019-05-04 ENCOUNTER — Other Ambulatory Visit: Payer: Self-pay | Admitting: Obstetrics & Gynecology

## 2019-05-04 DIAGNOSIS — R928 Other abnormal and inconclusive findings on diagnostic imaging of breast: Secondary | ICD-10-CM

## 2019-05-06 DIAGNOSIS — L858 Other specified epidermal thickening: Secondary | ICD-10-CM | POA: Diagnosis not present

## 2019-05-06 DIAGNOSIS — L719 Rosacea, unspecified: Secondary | ICD-10-CM | POA: Diagnosis not present

## 2019-05-06 DIAGNOSIS — D2272 Melanocytic nevi of left lower limb, including hip: Secondary | ICD-10-CM | POA: Diagnosis not present

## 2019-05-06 DIAGNOSIS — L661 Lichen planopilaris: Secondary | ICD-10-CM | POA: Diagnosis not present

## 2019-05-11 ENCOUNTER — Other Ambulatory Visit: Payer: Self-pay

## 2019-05-11 ENCOUNTER — Ambulatory Visit: Payer: BC Managed Care – PPO

## 2019-05-11 ENCOUNTER — Ambulatory Visit
Admission: RE | Admit: 2019-05-11 | Discharge: 2019-05-11 | Disposition: A | Discharge status: Home / Self Care | Payer: BC Managed Care – PPO | Source: Ambulatory Visit | Attending: Obstetrics & Gynecology | Admitting: Obstetrics & Gynecology

## 2019-05-11 DIAGNOSIS — R928 Other abnormal and inconclusive findings on diagnostic imaging of breast: Secondary | ICD-10-CM

## 2019-05-11 DIAGNOSIS — R922 Inconclusive mammogram: Secondary | ICD-10-CM | POA: Diagnosis not present

## 2019-05-13 ENCOUNTER — Other Ambulatory Visit: Payer: BC Managed Care – PPO

## 2019-06-28 ENCOUNTER — Telehealth (INDEPENDENT_AMBULATORY_CARE_PROVIDER_SITE_OTHER): Payer: BC Managed Care – PPO | Admitting: Psychiatry

## 2019-06-28 ENCOUNTER — Encounter (HOSPITAL_COMMUNITY): Payer: Self-pay | Admitting: Psychiatry

## 2019-06-28 DIAGNOSIS — F410 Panic disorder [episodic paroxysmal anxiety] without agoraphobia: Secondary | ICD-10-CM | POA: Diagnosis not present

## 2019-06-28 DIAGNOSIS — F429 Obsessive-compulsive disorder, unspecified: Secondary | ICD-10-CM

## 2019-06-28 DIAGNOSIS — F411 Generalized anxiety disorder: Secondary | ICD-10-CM

## 2019-06-28 MED ORDER — FLUOXETINE HCL 20 MG PO CAPS: 20.0000 mg | ORAL_CAPSULE | Freq: Every day | ORAL | 0 refills | Status: DC

## 2019-06-28 MED ORDER — CLONAZEPAM 0.5 MG PO TABS: 0.5000 mg | ORAL_TABLET | Freq: Two times a day (BID) | ORAL | 0 refills | Status: DC | PRN

## 2019-06-28 NOTE — Progress Notes (Signed)
[facility] Outpatient Follow up visit   Patient Identification: Frances Ho MRN:  [id number] Date of Evaluation:  06/28/2019 Referral Source: Primary care Chief Complaint:    follow up  Visit Diagnosis:  OCD GAD Insomnia History of Present Illness:     I connected with [name] on 06/28/19 at  4:15 PM EDT by telephone and verified that I am speaking with the correct person using two identifiers. I discussed the limitations, risks, security and privacy concerns of performing an evaluation and management service by telephone and the availability of in person appointments. I also discussed with the patient that there may be a patient responsible charge related to this service. The patient expressed understanding and agreed to proceed.  49   years old currently single Caucasian female who is living with her mom. Referred initially by primary care physician for management of sleep, anxiety  and OCD  Last seen in January 2021. Prior to that she came back after one year   Has self cut down her prozac to 20mg  from 40mg . 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 Abnormal Pap smear of cervix 2009  . Anxiety   . Chronic headaches   . Depression   . Diastolic dysfunction 03/12/2016  . Endometriosis   . Genital warts   . Heart murmur   . Mitral valve prolapse    mild bileaflet MVP with trivial MR by echo 04/2017  . OCD  (obsessive compulsive disorder)   . OSA (obstructive sleep apnea) 07/30/2010  . Tachycardia     Past Surgical History:  Procedure Laterality Date  . COLPOSCOPY  3/09   w/ECC-neg  . FOOT SURGERY  2005   left  . ovarian cyst removed    . PELVIC LAPAROSCOPY Bilateral 11/13/03   laparoscopic removal bilateral endometriomas    Family Psychiatric History: Mom : depression and anxiety  Family History:  Family History  Problem Relation Age of Onset  . Skin cancer Mother   . Diabetes Other        grandfather  . Hypertension Other        grandfather/grandmother  . Heart disease Maternal Grandmother   . Heart disease Maternal Grandfather   . Heart disease Paternal Grandmother   . Heart disease Paternal Grandfather     Social History:   Social History   Socioeconomic History  . Marital status: Single    Spouse name: Not on file  . Number of children: 0  . Years of education: Not on file  . Highest education level: Not on file  Occupational History  . 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 Smoking status: Never Smoker  . Smokeless tobacco: Never Used  Substance and Sexual Activity  . Alcohol use: No  . Drug use: No  . Sexual activity: Not Currently    Partners: Male    Birth  control/protection: Pill    Comment: yaz  Other Topics Concern  . Not on file  Social History Narrative   Pt is single and lives with family.    Social Determinants of Health   Financial Resource Strain:   . Difficulty of Paying Living Expenses:   Food Insecurity:   . Worried About Charity fundraiser in the Last Year:   . Arboriculturist in the Last Year:   Transportation Needs:   . Film/video editor (Medical):   Marland Kitchen Lack of Transportation (Non-Medical):   Physical Activity:   . Days of Exercise per Week:   . Minutes of Exercise per Session:   Stress:   . Feeling of Stress :   Social Connections:   . Frequency of Communication with Friends and Family:   . Frequency of  Social Gatherings with Friends and Family:   . Attends Religious Services:   . Active Member of Clubs or Organizations:   . Attends Archivist Meetings:   Marland Kitchen Marital Status:      Allergies:  No Known Allergies  Metabolic Disorder Labs: Lab Results  Component Value Date   HGBA1C 5.1 01/31/2019   No results found for: PROLACTIN Lab Results  Component Value Date   CHOL 243 (H) 01/31/2019   TRIG 192 (H) 01/31/2019   HDL 65 01/31/2019   CHOLHDL 3.7 01/31/2019   VLDL 36 (H) 01/04/2016   LDLCALC 144 (H) 01/31/2019   LDLCALC 125 (H) 04/16/2017     Current Medications: Current Outpatient Medications  Medication Sig Dispense Refill  . B Complex Vitamins (B COMPLEX PO) Take by mouth.    Marland Kitchen BIOTIN PO Take 1,000 mg by mouth.    . clonazePAM (KLONOPIN) 0.5 MG tablet Take 1 tablet (0.5 mg total) by mouth 2 (two) times daily as needed for anxiety. 30 tablet 0  . FLUoxetine (PROZAC) 20 MG capsule Take 1 capsule (20 mg total) by mouth daily. 90 capsule 0  . GLUCOSA-CHONDR-NA CHONDR-MSM PO Take 1,500 mg by mouth daily.    . Magnesium 100 MG CAPS Take 100 mg by mouth 2 (two) times daily.    . Methylsulfonylmethane (MSM) 1000 MG CAPS Take 1 capsule by mouth daily.      . Multiple Vitamin (MULTIVITAMIN) tablet Take 1 tablet by mouth daily.     Marland Kitchen YAZ 3-0.02 MG tablet Take 1 tablet by mouth daily. 84 tablet 4   No current facility-administered medications for this visit.      Psychiatric Specialty Exam: Review of Systems  Skin: Negative for rash.  Psychiatric/Behavioral: Negative for substance abuse and suicidal ideas.    There were no vitals taken for this visit.There is no height or weight on file to calculate BMI.  General Appearance:   Eye Contact:  Speech:  Normal Rate  Volume:  Decreased  Mood: somewhat subdued/stressed  Affect:  congruent  Thought Process:  Goal Directed  Orientation:  Full (Time, Place, and Person)  Thought Content:  Rumination  Suicidal Thoughts:   No  Homicidal Thoughts:  No  Memory:  Immediate;   Fair Recent;   Fair  Judgement:  Fair  Insight:  Fair  Psychomotor Activity:  Decreased  Concentration:  Concentration: Fair and Attention Span: Fair  Recall:  [employer] of Knowledge:Fair  Language: Fair  Akathisia:  Negative  Handed:  Right  AIMS (if indicated):    Assets:  Desire for Improvement  ADL's:  Intact  Cognition: WNL  Sleep:  poor    Treatment Plan Summary: Medication management and Plan as follows   GAD and panic symptoms: stressed, dees not want to increase back prozac to 40mg  Wants klonopine prn. Will write for now Consider therapy  She remains upset the way she was laid off. Provided supportive therapy     OCD: fluctuates, continue prozac , discussed higher dose be better, she doesn't want to increase for now  Depression: somewhat subdued, not hoepelss. Continue prozac and consider therapy Not suicidal  meds reviewed She wants to comeback in 4m not earlier for now I discussed the assessment and treatment plan with the patient. The patient was provided an opportunity to ask questions and all were answered. The patient agreed with the plan and demonstrated an understanding of the instructions.   The patient was advised to call back or seek an in-person evaluation if the symptoms worsen or if the condition fails to improve as anticipated. Non face to face time spent 1m , MD 6/1/20214:36 PM

## 2019-08-05 ENCOUNTER — Telehealth (HOSPITAL_COMMUNITY): Payer: Self-pay | Admitting: Psychiatry

## 2019-08-05 NOTE — Telephone Encounter (Signed)
GeneSight kit has been sent to the patients' home

## 2019-08-05 NOTE — Telephone Encounter (Signed)
Yes be ok

## 2019-08-05 NOTE — Telephone Encounter (Signed)
Pt called and is requesting gene sight study. Would you be ok with ordering this?   Her CB # [phone]

## 2019-08-22 DIAGNOSIS — L661 Lichen planopilaris: Secondary | ICD-10-CM | POA: Diagnosis not present

## 2019-09-29 ENCOUNTER — Telehealth (HOSPITAL_COMMUNITY): Payer: BC Managed Care – PPO | Admitting: Psychiatry

## 2019-10-02 ENCOUNTER — Other Ambulatory Visit (HOSPITAL_COMMUNITY): Payer: Self-pay | Admitting: Psychiatry

## 2019-10-04 ENCOUNTER — Telehealth (INDEPENDENT_AMBULATORY_CARE_PROVIDER_SITE_OTHER): Payer: BC Managed Care – PPO | Admitting: Psychiatry

## 2019-10-04 ENCOUNTER — Encounter (HOSPITAL_COMMUNITY): Payer: Self-pay | Admitting: Psychiatry

## 2019-10-04 DIAGNOSIS — F411 Generalized anxiety disorder: Secondary | ICD-10-CM

## 2019-10-04 DIAGNOSIS — F429 Obsessive-compulsive disorder, unspecified: Secondary | ICD-10-CM | POA: Diagnosis not present

## 2019-10-04 MED ORDER — FLUOXETINE HCL 60 MG PO TABS: 30.0000 mg | ORAL_TABLET | Freq: Every day | ORAL | 0 refills | Status: DC

## 2019-10-04 NOTE — Progress Notes (Signed)
[facility] Outpatient Follow up visit   Patient Identification: Frances Ho MRN:  [id number] Date of Evaluation:  10/04/2019 Referral Source: Primary care Chief Complaint:    follow up  Visit Diagnosis:  OCD GAD Insomnia History of Present Illness:     I connected with Frances Ho on 10/04/19 at  4:15 PM EDT by telephone and verified that I am speaking with the correct person using two identifiers. I discussed the limitations, risks, security and privacy concerns of performing an evaluation and management service by telephone and the availability of in person appointments. I also discussed with the patient that there may be a patient responsible charge related to this service. The patient expressed understanding and agreed to proceed. Patient location home Provider location home office  49   years old currently single Caucasian female who is living with her mom. Referred initially by primary care physician for management of sleep, anxiety  and OCD  Has self cut prozac to 20mg  prior last visit says did not want to be on 40mg  Endorses anxiety, checks on things and stove it takes an hour or more  Has got tinnitis checked and says no particular treatment  Understands it may not be medicine related and looked into possibility of increasing prozac  Discussed therapy options for OCD as well Gets overwhelmed if have to think too much and then gets distracted Stressed mom having back surgery Needs klonopine prn  Has been on unemployment Avoids going out due to covid as well and anxious to get vaccinated    Aggravating factor: job loss, lonliness Modifying factor: home, family No psychosis     Past Medical History:  Past Medical History:  Diagnosis Date  . Abnormal Pap smear of cervix 2009  . Anxiety   . Chronic headaches   . Depression   . Diastolic dysfunction 03/12/2016  . Endometriosis   . Genital warts   . Heart murmur   . Mitral valve prolapse    mild  bileaflet MVP with trivial MR by echo 04/2017  . OCD (obsessive compulsive disorder)   . OSA (obstructive sleep apnea) 07/30/2010  . Tachycardia     Past Surgical History:  Procedure Laterality Date  . COLPOSCOPY  3/09   w/ECC-neg  . FOOT SURGERY  2005   left  . ovarian cyst removed    . PELVIC LAPAROSCOPY Bilateral 11/13/03   laparoscopic removal bilateral endometriomas    Family Psychiatric History: Mom : depression and anxiety  Family History:  Family History  Problem Relation Age of Onset  . Skin cancer Mother   . Diabetes Other        grandfather  . Hypertension Other        grandfather/grandmother  . Heart disease Maternal Grandmother   . Heart disease Maternal Grandfather   . Heart disease Paternal Grandmother   . Heart disease Paternal Grandfather     Social History:   Social History   Socioeconomic History  . Marital status: Single    Spouse name: Not on file  . Number of children: 0  . Years of education: Not on file  . Highest education level: Not on file  Occupational History  . 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 Smoking status: Never Smoker  . Smokeless tobacco: Never Used  Vaping Use  . Vaping Use: Never used  Substance and Sexual Activity  . Alcohol use: No  . Drug use: No  . Sexual activity: Not Currently  Partners: Male    Birth control/protection: Pill    Comment: yaz  Other Topics Concern  . Not on file  Social History Narrative   Pt is single and lives with family.    Social Determinants of Health   Financial Resource Strain:   . Difficulty of Paying Living Expenses: Not on file  Food Insecurity:   . Worried About Programme researcher, broadcasting/film/video in the Last Year: Not on file  . Ran Out of Food in the Last Year: Not on file  Transportation Needs:   . Lack of Transportation (Medical): Not on file  . Lack of Transportation (Non-Medical): Not on file  Physical Activity:   . Days of Exercise per Week: Not on file  . Minutes  of Exercise per Session: Not on file  Stress:   . Feeling of Stress : Not on file  Social Connections:   . Frequency of Communication with Friends and Family: Not on file  . Frequency of Social Gatherings with Friends and Family: Not on file  . Attends Religious Services: Not on file  . Active Member of Clubs or Organizations: Not on file  . Attends Banker Meetings: Not on file  . Marital Status: Not on file     Allergies:  No Known Allergies  Metabolic Disorder Labs: Lab Results  Component Value Date   HGBA1C 5.1 01/31/2019   No results found for: PROLACTIN Lab Results  Component Value Date   CHOL 243 (H) 01/31/2019   TRIG 192 (H) 01/31/2019   HDL 65 01/31/2019   CHOLHDL 3.7 01/31/2019   VLDL 36 (H) 01/04/2016   LDLCALC 144 (H) 01/31/2019   LDLCALC 125 (H) 04/16/2017     Current Medications: Current Outpatient Medications  Medication Sig Dispense Refill  . B Complex Vitamins (B COMPLEX PO) Take by mouth.    Marland Kitchen BIOTIN PO Take 1,000 mg by mouth.    . clonazePAM (KLONOPIN) 0.5 MG tablet Take 1 tablet (0.5 mg total) by mouth 2 (two) times daily as needed for anxiety. 30 tablet 0  . FLUoxetine (PROZAC) 20 MG capsule Take 1 capsule (20 mg total) by mouth daily. 90 capsule 0  . FLUoxetine 60 MG TABS Take 30 mg by mouth daily. 15 tablet 0  . GLUCOSA-CHONDR-NA CHONDR-MSM PO Take 1,500 mg by mouth daily.    . Magnesium 100 MG CAPS Take 100 mg by mouth 2 (two) times daily.    . Methylsulfonylmethane (MSM) 1000 MG CAPS Take 1 capsule by mouth daily.      . Multiple Vitamin (MULTIVITAMIN) tablet Take 1 tablet by mouth daily.     Marland Kitchen YAZ 3-0.02 MG tablet Take 1 tablet by mouth daily. 84 tablet 4   No current facility-administered medications for this visit.      Psychiatric Specialty Exam: Review of Systems  Skin: Negative for rash.  Psychiatric/Behavioral: Negative for substance abuse and suicidal ideas.    There were no vitals taken for this visit.There is no  height or weight on file to calculate BMI.  General Appearance:   Eye Contact:  Speech:  Normal Rate  Volume:  Decreased  Mood: stressed  Affect:  congruent  Thought Process:  Goal Directed  Orientation:  Full (Time, Place, and Person)  Thought Content:  Rumination  Suicidal Thoughts:  No  Homicidal Thoughts:  No  Memory:  Immediate;   Fair Recent;   Fair  Judgement:  Fair  Insight:  Fair  Psychomotor Activity:  Decreased  Concentration:  Concentration: Fair and Attention Span: Fair  Recall:  [employer] of Knowledge:Fair  Language: Fair  Akathisia:  Negative  Handed:  Right  AIMS (if indicated):    Assets:  Desire for Improvement  ADL's:  Intact  Cognition: WNL  Sleep:  poor    Treatment Plan Summary: Medication management and Plan as follows   GAD and panic symptoms: stressed, agrees to increase med but wants to go only 30mg  and tablet form only Discussed to consider therapy for OCD , says will think about it Wants klonopine prn.     OCD: continues to have more during evening, increase prozac to 30mg  or half of 60mg   Depression: somewhat subdued, not hoepelss. Continue prozac and increase  and consider therapy Not suicidal  meds reviewed She wants to comeback in 67m not earlier for now I discussed the assessment and treatment plan with the patient. The patient was provided an opportunity to ask questions and all were answered. The patient agreed with the plan and demonstrated an understanding of the instructions.   The patient was advised to call back or seek an in-person evaluation if the symptoms worsen or if the condition fails to improve as anticipated. Non face to face time spent 15 - 20 min , MD 9/7/20214:29 PM

## 2019-10-25 DIAGNOSIS — H5203 Hypermetropia, bilateral: Secondary | ICD-10-CM | POA: Diagnosis not present

## 2019-10-25 DIAGNOSIS — H04123 Dry eye syndrome of bilateral lacrimal glands: Secondary | ICD-10-CM | POA: Diagnosis not present

## 2019-10-25 DIAGNOSIS — H5371 Glare sensitivity: Secondary | ICD-10-CM | POA: Diagnosis not present

## 2019-10-25 DIAGNOSIS — H524 Presbyopia: Secondary | ICD-10-CM | POA: Diagnosis not present

## 2019-12-07 ENCOUNTER — Other Ambulatory Visit (HOSPITAL_COMMUNITY): Payer: Self-pay

## 2019-12-07 MED ORDER — FLUOXETINE HCL 60 MG PO TABS: 30.0000 mg | ORAL_TABLET | Freq: Every day | ORAL | 0 refills | Status: DC

## 2019-12-08 ENCOUNTER — Telehealth (HOSPITAL_COMMUNITY): Payer: Self-pay | Admitting: Psychiatry

## 2019-12-08 NOTE — Telephone Encounter (Signed)
Pt would like to increase her prozac to 40mg  daily. She does not want to make an apt.   Please advise.

## 2019-12-08 NOTE — Telephone Encounter (Signed)
She can increase to prozac but should check back with [place] in few weeks or earlier to review. Ask her to use the medicine she has to make it 40mg  or Crystal can send new prescription to her choice of pharmacy

## 2019-12-09 MED ORDER — FLUOXETINE HCL 40 MG PO CAPS: 40.0000 mg | ORAL_CAPSULE | Freq: Every day | ORAL | 0 refills | Status: DC

## 2019-12-09 NOTE — Telephone Encounter (Signed)
90 day supply of 40mg  sent

## 2019-12-12 ENCOUNTER — Other Ambulatory Visit (HOSPITAL_COMMUNITY): Payer: Self-pay

## 2019-12-12 MED ORDER — FLUOXETINE HCL 20 MG PO TABS: 40.0000 mg | ORAL_TABLET | Freq: Every day | ORAL | 0 refills | Status: DC

## 2019-12-13 ENCOUNTER — Encounter: Payer: Self-pay | Admitting: Cardiology

## 2019-12-13 ENCOUNTER — Other Ambulatory Visit: Payer: Self-pay

## 2019-12-13 ENCOUNTER — Ambulatory Visit: Payer: BC Managed Care – PPO | Admitting: Cardiology

## 2019-12-13 VITALS — BP 100/70 | HR 86 | Ht 62.0 in | Wt 142.6 lb

## 2019-12-13 DIAGNOSIS — F410 Panic disorder [episodic paroxysmal anxiety] without agoraphobia: Secondary | ICD-10-CM | POA: Diagnosis not present

## 2019-12-13 DIAGNOSIS — I341 Nonrheumatic mitral (valve) prolapse: Secondary | ICD-10-CM

## 2019-12-13 DIAGNOSIS — R079 Chest pain, unspecified: Secondary | ICD-10-CM | POA: Diagnosis not present

## 2019-12-13 DIAGNOSIS — I340 Nonrheumatic mitral (valve) insufficiency: Secondary | ICD-10-CM | POA: Diagnosis not present

## 2019-12-13 NOTE — Addendum Note (Signed)
Addended by: Theresia Majors on: 12/13/2019 04:26 PM   Modules accepted: Orders

## 2019-12-13 NOTE — Patient Instructions (Signed)
Medication Instructions:  Your physician recommends that you continue on your current medications as directed. Please refer to the Current Medication list given to you today.  *If you need a refill on your cardiac medications before your next appointment, please call your pharmacy*  Testing/Procedures: Your physician has requested that you have an echocardiogram. Echocardiography is a painless test that uses sound waves to create images of your heart. It provides your doctor with information about the size and shape of your heart and how well your heart's chambers and valves are working. This procedure takes approximately one hour. There are no restrictions for this procedure.  Follow-Up: At [facility], you and your health needs are our priority.  As part of our continuing mission to provide you with exceptional heart care, we have created designated Provider Care Teams.  These Care Teams include your primary Cardiologist (physician) and Advanced Practice Providers (APPs -  Physician Assistants and Nurse Practitioners) who all work together to provide you with the care you need, when you need it.  We recommend signing up for the patient portal called "MyChart".  Sign up information is provided on this After Visit Summary.  MyChart is used to connect with patients for Virtual Visits (Telemedicine).  Patients are able to view lab/test results, encounter notes, upcoming appointments, etc.  Non-urgent messages can be sent to your provider as well.   To learn more about what you can do with MyChart, go to [email or web address].    Your next appointment:   1 year(s)  The format for your next appointment:   In Person  Provider:   You may see Traci Turner, MD or one of the following Advanced Practice Providers on your designated Care Team:    Dayna Dunn, PA-C  Michele Lenze, PA-C 

## 2019-12-13 NOTE — Progress Notes (Signed)
Cardiology Office Note:    Date:  12/13/2019   ID:  Frances Ho, DOB 1970/04/25, MRN [id number]  PCP:  Patient, No Pcp Per  Cardiologist:  Armanda Magic, MD    Referring MD: No ref. provider found   Chief Complaint  Patient presents with  . Follow-up    MVP with MR    History of Present Illness:    Frances Ho is a 49 y.o. 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  She is here today for followup and is doing well.  She has had some problems with pain over her sternum that is reproducible with palpitation and then goes away and will come back on the right side. It is exacerbated by movements of her chest wall.  She denies any SOB, DOE, PND, orthopnea, LE edema, dizziness, palpitations or syncope. She is compliant with her meds and is tolerating meds with no SE.     Past Medical History:  Diagnosis Date  . Abnormal Pap smear of cervix 2009  . Anxiety   . Chronic headaches   . Depression   . Diastolic dysfunction 03/12/2016  . Endometriosis   . Genital warts   . Heart murmur   . Mitral valve prolapse    mild bileaflet MVP with trivial MR by echo 04/2017  . OCD (obsessive compulsive disorder)   . OSA (obstructive sleep apnea) 07/30/2010  . Tachycardia     Past Surgical History:  Procedure Laterality Date  . COLPOSCOPY  3/09   w/ECC-neg  . FOOT SURGERY  2005   left  . ovarian cyst removed    . PELVIC LAPAROSCOPY Bilateral 11/13/03   laparoscopic removal bilateral endometriomas    Current Medications: Current Meds  Medication Sig  . B Complex Vitamins (B COMPLEX PO) Take by mouth.  Marland Kitchen BIOTIN PO Take 1,000 mg by mouth.  . clonazePAM (KLONOPIN) 0.5 MG tablet Take 1 tablet (0.5 mg total) by mouth 2 (two) times daily as needed for anxiety.  . DENTA 5000 PLUS 1.1 % CREA dental cream Take 1 application by mouth daily.  . Fluocinolone Acetonide Body 0.01 % OIL Apply 1 application topically 2 (two) times daily.  Marland Kitchen FLUoxetine (PROZAC) 20 MG  tablet Take 2 tablets (40 mg total) by mouth daily.  Marland Kitchen GLUCOSA-CHONDR-NA CHONDR-MSM PO Take 1,500 mg by mouth daily.  . Magnesium 100 MG CAPS Take 100 mg by mouth 2 (two) times daily.  . Methylsulfonylmethane (MSM) 1000 MG CAPS Take 1 capsule by mouth daily.    . Multiple Vitamin (MULTIVITAMIN) tablet Take 1 tablet by mouth daily.   Marland Kitchen YAZ 3-0.02 MG tablet Take 1 tablet by mouth daily.     Allergies:   Patient has no known allergies.   Social History   Socioeconomic History  . Marital status: Single    Spouse name: Not on file  . Number of children: 0  . Years of education: Not on file  . Highest education level: Not on file  Occupational History  . 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 Smoking status: Never Smoker  . Smokeless tobacco: Never Used  Vaping Use  . Vaping Use: Never used  Substance and Sexual Activity  . Alcohol use: No  . Drug use: No  . Sexual activity: Not Currently    Partners: Male    Birth control/protection: Pill    Comment: yaz  Other Topics Concern  . Not on file  Social  History Narrative   Pt is single and lives with family.    Social Determinants of Health   Financial Resource Strain:   . Difficulty of Paying Living Expenses: Not on file  Food Insecurity:   . Worried About Programme researcher, broadcasting/film/video in the Last Year: Not on file  . Ran Out of Food in the Last Year: Not on file  Transportation Needs:   . Lack of Transportation (Medical): Not on file  . Lack of Transportation (Non-Medical): Not on file  Physical Activity:   . Days of Exercise per Week: Not on file  . Minutes of Exercise per Session: Not on file  Stress:   . Feeling of Stress : Not on file  Social Connections:   . Frequency of Communication with Friends and Family: Not on file  . Frequency of Social Gatherings with Friends and Family: Not on file  . Attends Religious Services: Not on file  . Active Member of Clubs or Organizations: Not on file  . Attends Tax inspector Meetings: Not on file  . Marital Status: Not on file     Family History: The patient's family history includes Diabetes in an other family member; Heart disease in her maternal grandfather, maternal grandmother, paternal grandfather, and paternal grandmother; Hypertension in an other family member; Skin cancer in her mother.  ROS:   Please see the history of present illness.    ROS  All other systems reviewed and negative.   EKGs/Labs/Other Studies Reviewed:    The following studies were reviewed today:   EKG:  EKG is ordered today.   Recent Labs: 01/31/2019: ALT 19; BUN 12; Creatinine, Ser 0.85; Hemoglobin 13.3; Platelets 244; Potassium 3.8; Sodium 137; TSH 2.280   Recent Lipid Panel    Component Value Date/Time   CHOL 243 (H) 01/31/2019 1640   TRIG 192 (H) 01/31/2019 1640   HDL 65 01/31/2019 1640   CHOLHDL 3.7 01/31/2019 1640   CHOLHDL 2.9 01/04/2016 1410   VLDL 36 (H) 01/04/2016 1410   LDLCALC 144 (H) 01/31/2019 1640    Physical Exam:    VS:  BP 100/70   Pulse 86   Ht 5\' 2"  (1.575 m)   Wt 142 lb 9.6 oz (64.7 kg)   SpO2 99%   BMI 26.08 kg/m     Wt Readings from Last 3 Encounters:  12/13/19 142 lb 9.6 oz (64.7 kg)  01/31/19 145 lb 3.2 oz (65.9 kg)  12/10/18 146 lb (66.2 kg)    GEN: Well nourished, well developed in no acute distress HEENT: Normal NECK: No JVD; No carotid bruits LYMPHATICS: No lymphadenopathy CARDIAC:RRR, no murmurs, rubs, gallops RESPIRATORY:  Clear to auscultation without rales, wheezing or rhonchi  ABDOMEN: Soft, non-tender, non-distended MUSCULOSKELETAL:  No edema; No deformity  SKIN: Warm and dry NEUROLOGIC:  Alert and oriented x 3 PSYCHIATRIC:  Normal affect   ASSESSMENT:    1. Mitral valve prolapse   2. Nonrheumatic mitral valve regurgitation   3. Panic attacks   4. Chest pain of uncertain etiology    PLAN:    In order of problems listed above:  1.  MVP with MR -echo 2019 showed bileaflet MVP with trivial  MR -repeat 2D echo to reassess  2.  Chest pain -ETT showed no ischemia in 2018 and coronary Ca score 12/2018 was 0 -she has had what sounds like MSK pain that is reproducible with palpation over the sternum  3.  Anxiety and panic attacks -following with Psychiatry  Medication Adjustments/Labs and Tests Ordered: Current medicines are reviewed at length with the patient today.  Concerns regarding medicines are outlined above.  Orders Placed This Encounter  Procedures  . EKG 12-Lead   No orders of the defined types were placed in this encounter.   Signed, Armanda Magic, MD  12/13/2019 4:18 PM     [facility]

## 2020-01-04 ENCOUNTER — Ambulatory Visit (HOSPITAL_COMMUNITY): Payer: BC Managed Care – PPO | Attending: Internal Medicine

## 2020-01-04 ENCOUNTER — Other Ambulatory Visit: Payer: Self-pay

## 2020-01-04 ENCOUNTER — Encounter: Payer: Self-pay | Admitting: Cardiology

## 2020-01-04 DIAGNOSIS — I341 Nonrheumatic mitral (valve) prolapse: Secondary | ICD-10-CM | POA: Diagnosis not present

## 2020-01-04 LAB — ECHOCARDIOGRAM COMPLETE
Area-P 1/2: 3.81 cm2
S' Lateral: 2.3 cm

## 2020-01-05 ENCOUNTER — Telehealth (HOSPITAL_COMMUNITY): Payer: BC Managed Care – PPO | Admitting: Psychiatry

## 2020-01-12 ENCOUNTER — Telehealth (INDEPENDENT_AMBULATORY_CARE_PROVIDER_SITE_OTHER): Payer: BC Managed Care – PPO | Admitting: Psychiatry

## 2020-01-12 ENCOUNTER — Encounter (HOSPITAL_COMMUNITY): Payer: Self-pay | Admitting: Psychiatry

## 2020-01-12 DIAGNOSIS — F429 Obsessive-compulsive disorder, unspecified: Secondary | ICD-10-CM

## 2020-01-12 DIAGNOSIS — F411 Generalized anxiety disorder: Secondary | ICD-10-CM

## 2020-01-12 NOTE — Progress Notes (Signed)
[facility] Outpatient Follow up visit   Patient Identification: Frances Ho MRN:  [id number] Date of Evaluation:  01/12/2020 Referral Source: Primary care Chief Complaint:    follow up  Visit Diagnosis:   GAD Insomnia History of Present Illness:     I connected with Frances Ho on 10/04/19 at  4:15 PM EDT by telephone and verified that I am speaking with the correct person using two identifiers. I discussed the limitations, risks, security and privacy concerns of performing an evaluation and management service by telephone and the availability of in person appointments. I also discussed with the patient that there may be a patient responsible charge related to this service. The patient expressed understanding and agreed to proceed. Patient location home Provider location home office  49   years old currently single Caucasian female who is living with her mom. Referred initially by primary care physician for management of sleep, anxiety  and OCD  Was feeling subdued and called now prozac is at 40mg , mom getting hip surgery , stressed but managing it   Says OCD makes her not able to find job as gets anxious while multi tasking Needs klonopine prn  Has been on unemployment Avoids going out due to covid as well and anxious to get vaccinated    Aggravating factor: finances, lonliness Modifying factor: home, family No psychosis     Past Medical History:  Past Medical History:  Diagnosis Date   Abnormal Pap smear of cervix 2009   Anxiety    Chronic headaches    Depression    Diastolic dysfunction 03/12/2016   Endometriosis    Genital warts    Heart murmur    Mitral valve prolapse    mild bileaflet MVP with mild MR on echo 12/2019   OCD (obsessive compulsive disorder)    OSA (obstructive sleep apnea) 07/30/2010   Tachycardia     Past Surgical History:  Procedure Laterality Date   COLPOSCOPY  3/09   w/ECC-neg   FOOT SURGERY  2005   left    ovarian cyst removed     PELVIC LAPAROSCOPY Bilateral 11/13/03   laparoscopic removal bilateral endometriomas    Family Psychiatric History: Mom : depression and anxiety  Family History:  Family History  Problem Relation Age of Onset   Skin cancer Mother    Diabetes Other        grandfather   Hypertension Other        grandfather/grandmother   Heart disease Maternal Grandmother    Heart disease Maternal Grandfather    Heart disease Paternal Grandmother    Heart disease Paternal Grandfather     Social History:   Social History   Socioeconomic History   Marital status: Single    Spouse name: Not on file   Number of children: 0   Years of education: Not on file   Highest education level: Not on file  Occupational History   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    Social Determinants of Health  Financial Resource Strain: Not on file  Food Insecurity: Not on file  Transportation Needs: Not on file  Physical Activity: Not on file  Stress: Not on file  Social Connections: Not on file     Allergies:  No Known Allergies  Metabolic Disorder Labs: Lab Results  Component Value Date   HGBA1C 5.1 01/31/2019   No results found for: PROLACTIN Lab Results  Component Value Date   CHOL 243 (H) 01/31/2019   TRIG 192 (H) 01/31/2019   HDL 65 01/31/2019   CHOLHDL 3.7 01/31/2019   VLDL 36 (H) 01/04/2016   LDLCALC 144 (H) 01/31/2019   LDLCALC 125 (H) 04/16/2017     Current Medications: Current Outpatient Medications  Medication Sig Dispense Refill   B Complex Vitamins  (B COMPLEX PO) Take by mouth.     BIOTIN PO Take 1,000 mg by mouth.     clonazePAM (KLONOPIN) 0.5 MG tablet Take 1 tablet (0.5 mg total) by mouth 2 (two) times daily as needed for anxiety. 30 tablet 0   DENTA 5000 PLUS 1.1 % CREA dental cream Take 1 application by mouth daily.     Fluocinolone Acetonide Body 0.01 % OIL Apply 1 application topically 2 (two) times daily.     FLUoxetine (PROZAC) 20 MG tablet Take 2 tablets (40 mg total) by mouth daily. 180 tablet 0   GLUCOSA-CHONDR-NA CHONDR-MSM PO Take 1,500 mg by mouth daily.     Magnesium 100 MG CAPS Take 100 mg by mouth 2 (two) times daily.     Methylsulfonylmethane (MSM) 1000 MG CAPS Take 1 capsule by mouth daily.       Multiple Vitamin (MULTIVITAMIN) tablet Take 1 tablet by mouth daily.      YAZ 3-0.02 MG tablet Take 1 tablet by mouth daily. 84 tablet 4   No current facility-administered medications for this visit.      Psychiatric Specialty Exam: Review of Systems  Skin: Negative for rash.  Psychiatric/Behavioral: Negative for substance abuse and suicidal ideas.    There were no vitals taken for this visit.There is no height or weight on file to calculate BMI.  General Appearance:   Eye Contact:  Speech:  Normal Rate  Volume:  Decreased  Mood: somewhat subdued  Affect:  congruent  Thought Process:  Goal Directed  Orientation:  Full (Time, Place, and Person)  Thought Content:  Rumination  Suicidal Thoughts:  No  Homicidal Thoughts:  No  Memory:  Immediate;   Fair Recent;   Fair  Judgement:  Fair  Insight:  Fair  Psychomotor Activity:  Decreased  Concentration:  Concentration: Fair and Attention Span: Fair  Recall:  [employer] of Knowledge:Fair  Language: Fair  Akathisia:  Negative  Handed:  Right  AIMS (if indicated):    Assets:  Desire for Improvement  ADL's:  Intact  Cognition: WNL  Sleep:  poor    Treatment Plan Summary: Medication management and Plan as follows   GAD and panic symptoms: gets  anxious when multi task, says increase prozac has helped some  Discussed to consider therapy for OCD , says will think about it Wants klonopine prn.     OCD: fluctuates, continue prozac 40mg   Depression: somewhat subdued but understands due to holidays and no job, continue prozac, may look for simpler job if possible meds reviewed She wants to comeback in 29m not earlier for now I discussed the assessment and treatment plan with the patient. The patient was provided an opportunity to ask questions and  all were answered. The patient agreed with the plan and demonstrated an understanding of the instructions.  fu 2- 76m The patient was advised to call back or seek an in-person evaluation if the symptoms worsen or if the condition fails to improve as anticipated. Non face to face time spent 15  Min or less Thresa Ross, MD 12/16/20214:13 PM

## 2020-02-13 ENCOUNTER — Telehealth (HOSPITAL_COMMUNITY): Payer: Self-pay | Admitting: Psychiatry

## 2020-02-13 ENCOUNTER — Encounter (HOSPITAL_COMMUNITY): Payer: Self-pay | Admitting: Psychiatry

## 2020-02-13 NOTE — Telephone Encounter (Signed)
She would like to be excused from jury duty on 2/14.  She will need the original paper so she will need to pick it up within 7-10 days.    please call her when this is completed and signed.   [phone]

## 2020-02-13 NOTE — Telephone Encounter (Signed)
I send letter, will get original signed by next week

## 2020-02-14 NOTE — Telephone Encounter (Signed)
Can you print this also. Frances Ho will come sign it.

## 2020-02-15 NOTE — Telephone Encounter (Signed)
Letter printed and put in Dr. Gilmore Laroche box

## 2020-02-27 DIAGNOSIS — L661 Lichen planopilaris: Secondary | ICD-10-CM | POA: Diagnosis not present

## 2020-02-27 DIAGNOSIS — L089 Local infection of the skin and subcutaneous tissue, unspecified: Secondary | ICD-10-CM | POA: Diagnosis not present

## 2020-02-27 DIAGNOSIS — L658 Other specified nonscarring hair loss: Secondary | ICD-10-CM | POA: Diagnosis not present

## 2020-03-09 ENCOUNTER — Other Ambulatory Visit (HOSPITAL_COMMUNITY): Payer: Self-pay

## 2020-03-09 MED ORDER — FLUOXETINE HCL 20 MG PO TABS: 40.0000 mg | ORAL_TABLET | Freq: Every day | ORAL | 0 refills | Status: DC

## 2020-03-24 ENCOUNTER — Other Ambulatory Visit: Payer: Self-pay | Admitting: Obstetrics & Gynecology

## 2020-04-09 ENCOUNTER — Encounter (HOSPITAL_COMMUNITY): Payer: Self-pay | Admitting: Psychiatry

## 2020-04-09 ENCOUNTER — Telehealth (INDEPENDENT_AMBULATORY_CARE_PROVIDER_SITE_OTHER): Payer: BC Managed Care – PPO | Admitting: Psychiatry

## 2020-04-09 DIAGNOSIS — F411 Generalized anxiety disorder: Secondary | ICD-10-CM

## 2020-04-09 DIAGNOSIS — F429 Obsessive-compulsive disorder, unspecified: Secondary | ICD-10-CM | POA: Diagnosis not present

## 2020-04-09 NOTE — Progress Notes (Signed)
[facility] Outpatient Follow up visit   Patient Identification: Frances Ho MRN:  [id number] Date of Evaluation:  04/09/2020 Referral Source: Primary care Chief Complaint:    follow up  Visit Diagnosis:   GAD Insomnia History of Present Illness:     Virtual Visit via Telephone Note  I connected with Kandis Nab Bordelon on 04/09/20 at  4:30 PM EDT by telephone and verified that I am speaking with the correct person using two identifiers.  Location: Patient: home Provider: home office   I discussed the limitations, risks, security and privacy concerns of performing an evaluation and management service by telephone and the availability of in person appointments. I also discussed with the patient that there may be a patient responsible charge related to this service. The patient expressed understanding and agreed to proceed.   History of Present Illness:        I discussed the assessment and treatment plan with the patient. The patient was provided an opportunity to ask questions and all were answered. The patient agreed with the plan and demonstrated an understanding of the instructions.   The patient was advised to call back or seek an in-person evaluation if the symptoms worsen or if the condition fails to improve as anticipated.  I provided 12 minutes of non-face-to-face time during this encounter.    50   years old currently single Caucasian female who is living with her mom. Referred initially by primary care physician for management of sleep, anxiety  and OCD  Stays at home, financial stress but was not able to keep up with her last job Looking for another job Has to take care of mom Feels meds are doing what they can does not want to increase Psychosocial stressors discussed , she may look to add job or group therapy  klonopine helps ocd if gets worse, otherwise takes prozac  Aggravating factor: finances, lonliness Modifying factor: home, family No  psychosis     Past Medical History:  Past Medical History:  Diagnosis Date  . Abnormal Pap smear of cervix 2009  . Anxiety   . Chronic headaches   . Depression   . Diastolic dysfunction 03/12/2016  . Endometriosis   . Genital warts   . Heart murmur   . Mitral valve prolapse    mild bileaflet MVP with mild MR on echo 12/2019  . OCD (obsessive compulsive disorder)   . OSA (obstructive sleep apnea) 07/30/2010  . Tachycardia     Past Surgical History:  Procedure Laterality Date  . COLPOSCOPY  3/09   w/ECC-neg  . FOOT SURGERY  2005   left  . ovarian cyst removed    . PELVIC LAPAROSCOPY Bilateral 11/13/03   laparoscopic removal bilateral endometriomas    Family Psychiatric History: Mom : depression and anxiety  Family History:  Family History  Problem Relation Age of Onset  . Skin cancer Mother   . Diabetes Other        grandfather  . Hypertension Other        grandfather/grandmother  . Heart disease Maternal Grandmother   . Heart disease Maternal Grandfather   . Heart disease Paternal Grandmother   . Heart disease Paternal Grandfather     Social History:   Social History   Socioeconomic History  . Marital status: Single    Spouse name: Not on file  . Number of children: 0  . Years of education: Not on file  . Highest education level: Not on file  Occupational History  .  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 Smoking status: Never Smoker  . Smokeless tobacco: Never Used  Vaping Use  . Vaping Use: Never used  Substance and Sexual Activity  . Alcohol use: No  . Drug use: No  . Sexual activity: Not Currently    Partners: Male    Birth control/protection: Pill    Comment: yaz  Other Topics Concern  . Not on file  Social History Narrative   Pt is single and lives with family.    Social Determinants of Health   Financial Resource Strain: Not on file  Food Insecurity: Not on file  Transportation Needs: Not on file  Physical Activity: Not  on file  Stress: Not on file  Social Connections: Not on file     Allergies:  No Known Allergies  Metabolic Disorder Labs: Lab Results  Component Value Date   HGBA1C 5.1 01/31/2019   No results found for: PROLACTIN Lab Results  Component Value Date   CHOL 243 (H) 01/31/2019   TRIG 192 (H) 01/31/2019   HDL 65 01/31/2019   CHOLHDL 3.7 01/31/2019   VLDL 36 (H) 01/04/2016   LDLCALC 144 (H) 01/31/2019   LDLCALC 125 (H) 04/16/2017     Current Medications: Current Outpatient Medications  Medication Sig Dispense Refill  . B Complex Vitamins (B COMPLEX PO) Take by mouth.    Marland Kitchen BIOTIN PO Take 1,000 mg by mouth.    . clonazePAM (KLONOPIN) 0.5 MG tablet Take 1 tablet (0.5 mg total) by mouth 2 (two) times daily as needed for anxiety. 30 tablet 0  . DENTA 5000 PLUS 1.1 % CREA dental cream Take 1 application by mouth daily.    . Fluocinolone Acetonide Body 0.01 % OIL Apply 1 application topically 2 (two) times daily.    Marland Kitchen FLUoxetine (PROZAC) 20 MG tablet Take 2 tablets (40 mg total) by mouth daily. 180 tablet 0  . GLUCOSA-CHONDR-NA CHONDR-MSM PO Take 1,500 mg by mouth daily.    . Magnesium 100 MG CAPS Take 100 mg by mouth 2 (two) times daily.    . Methylsulfonylmethane (MSM) 1000 MG CAPS Take 1 capsule by mouth daily.      . Multiple Vitamin (MULTIVITAMIN) tablet Take 1 tablet by mouth daily.     Marland Kitchen NIKKI 3-0.02 MG tablet TAKE 1 TABLET BY MOUTH EVERY DAY 84 tablet 0   No current facility-administered medications for this visit.      Psychiatric Specialty Exam: Review of Systems  Psychiatric/Behavioral: Negative for substance abuse and suicidal ideas.    There were no vitals taken for this visit.There is no height or weight on file to calculate BMI.  General Appearance:   Eye Contact:  Speech:  Normal Rate  Volume:  Decreased  Mood: somewhat subdued  Affect:  congruent  Thought Process:  Goal Directed  Orientation:  Full (Time, Place, and Person)  Thought Content:  Rumination   Suicidal Thoughts:  No  Homicidal Thoughts:  No  Memory:  Immediate;   Fair Recent;   Fair  Judgement:  Fair  Insight:  Fair  Psychomotor Activity:  Decreased  Concentration:  Concentration: Fair and Attention Span: Fair  Recall:  [employer] of Knowledge:Fair  Language: Fair  Akathisia:  Negative  Handed:  Right  AIMS (if indicated):    Assets:  Desire for Improvement  ADL's:  Intact  Cognition: WNL  Sleep:  poor    Treatment Plan Summary: Medication management and Plan as follows  GAD and panic symptoms:  Worries related to finances, may look for job, consider therapy  Continue prozac, for now she doesn't want to add or increase it   continue prn klonopine OCD: fluctuates, continue prozac and klonopine prn   Depression:somewhat subdued, continue prozac, may look for job. Provided supportive therapy  Fu 2 m. She wants to come in 60m.   Thresa Ross, MD 3/14/20224:45 PM

## 2020-04-12 ENCOUNTER — Other Ambulatory Visit: Payer: Self-pay | Admitting: Obstetrics & Gynecology

## 2020-04-12 DIAGNOSIS — Z1231 Encounter for screening mammogram for malignant neoplasm of breast: Secondary | ICD-10-CM

## 2020-04-16 ENCOUNTER — Telehealth (HOSPITAL_COMMUNITY): Payer: BC Managed Care – PPO | Admitting: Psychiatry

## 2020-05-01 ENCOUNTER — Ambulatory Visit (INDEPENDENT_AMBULATORY_CARE_PROVIDER_SITE_OTHER): Payer: BC Managed Care – PPO | Admitting: Obstetrics & Gynecology

## 2020-05-01 ENCOUNTER — Ambulatory Visit: Payer: BC Managed Care – PPO

## 2020-05-01 ENCOUNTER — Other Ambulatory Visit (HOSPITAL_BASED_OUTPATIENT_CLINIC_OR_DEPARTMENT_OTHER)
Admission: RE | Admit: 2020-05-01 | Discharge: 2020-05-01 | Disposition: A | Discharge status: Home / Self Care | Payer: BC Managed Care – PPO | Source: Ambulatory Visit | Attending: Obstetrics & Gynecology | Admitting: Obstetrics & Gynecology

## 2020-05-01 ENCOUNTER — Other Ambulatory Visit (HOSPITAL_COMMUNITY)
Admission: RE | Admit: 2020-05-01 | Discharge: 2020-05-01 | Disposition: A | Discharge status: Home / Self Care | Payer: BC Managed Care – PPO | Source: Ambulatory Visit | Attending: Obstetrics & Gynecology | Admitting: Obstetrics & Gynecology

## 2020-05-01 ENCOUNTER — Other Ambulatory Visit: Payer: Self-pay

## 2020-05-01 ENCOUNTER — Encounter (HOSPITAL_BASED_OUTPATIENT_CLINIC_OR_DEPARTMENT_OTHER): Payer: Self-pay | Admitting: Obstetrics & Gynecology

## 2020-05-01 VITALS — BP 122/84 | HR 102 | Ht 61.75 in | Wt 141.0 lb

## 2020-05-01 DIAGNOSIS — Z0000 Encounter for general adult medical examination without abnormal findings: Secondary | ICD-10-CM

## 2020-05-01 DIAGNOSIS — E785 Hyperlipidemia, unspecified: Secondary | ICD-10-CM

## 2020-05-01 DIAGNOSIS — Z01419 Encounter for gynecological examination (general) (routine) without abnormal findings: Secondary | ICD-10-CM

## 2020-05-01 DIAGNOSIS — N809 Endometriosis, unspecified: Secondary | ICD-10-CM | POA: Diagnosis not present

## 2020-05-01 DIAGNOSIS — Z0184 Encounter for antibody response examination: Secondary | ICD-10-CM

## 2020-05-01 DIAGNOSIS — Z1159 Encounter for screening for other viral diseases: Secondary | ICD-10-CM

## 2020-05-01 DIAGNOSIS — Z8619 Personal history of other infectious and parasitic diseases: Secondary | ICD-10-CM | POA: Diagnosis not present

## 2020-05-01 DIAGNOSIS — Z124 Encounter for screening for malignant neoplasm of cervix: Secondary | ICD-10-CM

## 2020-05-01 LAB — HEMOGLOBIN A1C
Hgb A1c MFr Bld: 5.1 % (ref 4.8–5.6)
Mean Plasma Glucose: 99.67 mg/dL

## 2020-05-01 LAB — LIPID PANEL
Cholesterol: 230 mg/dL — ABNORMAL HIGH (ref 0–200)
HDL: 65 mg/dL (ref >40)
LDL Cholesterol: 102 mg/dL — ABNORMAL HIGH (ref 0–99)
Total CHOL/HDL Ratio: 3.5 RATIO
Triglycerides: 314 mg/dL — ABNORMAL HIGH (ref <150)
VLDL: 63 mg/dL — ABNORMAL HIGH (ref 0–40)

## 2020-05-01 LAB — HEPATITIS C ANTIBODY: HCV Ab: NONREACTIVE

## 2020-05-01 MED ORDER — DROSPIRENONE-ETHINYL ESTRADIOL 3-0.02 MG PO TABS: 1.0000 | ORAL_TABLET | Freq: Every day | ORAL | 4 refills | Status: DC

## 2020-05-01 NOTE — Progress Notes (Signed)
50 y.o. G0P0 Single Other or two or more races female here for annual exam.  Seeing Dr. Darreld Mclean for hair loss.  Using OTC minoxidil.  Not working.  Hasn't during these past two years.  Did get a treadmill and has been exercising on it.  Has lost a few pounds.  On OCPs.  Has monthly cycles.    Pt reports never having chicken pox.  Is interested in the shingles vaccine.  Aware just safe to give.  She wants to know if should have that or varicella vaccination.  Will test varicella antibodies today.   Patient's last menstrual period was 04/08/2020.          Sexually active: No.  The current method of family planning is OCP (estrogen/progesterone).   Exercising: Yes.    treadmill Smoker:  no  Health Maintenance: Pap:  Obtained today History of abnormal Pap:  Yes, 2009 MMG:  05/02/2019 Colonoscopy:  Discussed with pt today TDaP:  01/31/2019 Pneumonia vaccine(s):  Not indicated Shingrix:   Not indicated Hep C testing: will obtain today Screening Labs: fasting lipids obtained today   reports that she has never smoked. She has never used smokeless tobacco. She reports that she does not drink alcohol and does not use drugs.  Past Medical History:  Diagnosis Date  . Abnormal Pap smear of cervix 2009  . Anxiety   . Chronic headaches   . Depression   . Diastolic dysfunction 03/12/2016  . Endometriosis   . Genital warts   . Heart murmur   . Mitral valve prolapse    mild bileaflet MVP with mild MR on echo 12/2019  . OCD (obsessive compulsive disorder)   . OSA (obstructive sleep apnea) 07/30/2010  . Tachycardia     Past Surgical History:  Procedure Laterality Date  . COLPOSCOPY  3/09   w/ECC-neg  . FOOT SURGERY  2005   left  . ovarian cyst removed    . PELVIC LAPAROSCOPY Bilateral 11/13/03   laparoscopic removal bilateral endometriomas    Current Outpatient Medications  Medication Sig Dispense Refill  . B Complex Vitamins (B COMPLEX PO) Take by mouth.    Marland Kitchen BIOTIN PO Take 1,000 mg  by mouth.    . clonazePAM (KLONOPIN) 0.5 MG tablet Take 1 tablet (0.5 mg total) by mouth 2 (two) times daily as needed for anxiety. 30 tablet 0  . DENTA 5000 PLUS 1.1 % CREA dental cream Take 1 application by mouth daily.    Marland Kitchen FLUoxetine (PROZAC) 20 MG tablet Take 2 tablets (40 mg total) by mouth daily. 180 tablet 0  . GLUCOSA-CHONDR-NA CHONDR-MSM PO Take 1,500 mg by mouth daily.    . Magnesium 100 MG CAPS Take 100 mg by mouth 2 (two) times daily.    . Methylsulfonylmethane (MSM) 1000 MG CAPS Take 1 capsule by mouth daily.    . Multiple Vitamin (MULTIVITAMIN) tablet Take 1 tablet by mouth daily.    Marland Kitchen NIKKI 3-0.02 MG tablet TAKE 1 TABLET BY MOUTH EVERY DAY 84 tablet 0  . Fluocinolone Acetonide Body 0.01 % OIL Apply 1 application topically 2 (two) times daily. (Patient not taking: Reported on 05/01/2020)     No current facility-administered medications for this visit.    Family History  Problem Relation Age of Onset  . Skin cancer Mother   . Diabetes Other        grandfather  . Hypertension Other        grandfather/grandmother  . Heart disease Maternal Grandmother   .  Heart disease Maternal Grandfather   . Heart disease Paternal Grandmother   . Heart disease Paternal Grandfather     Review of Systems  All other systems reviewed and are negative.   Exam:   BP 122/84   Pulse (!) 102   Ht 5' 1.75" (1.568 m)   Wt 141 lb (64 kg)   LMP 04/08/2020   BMI 26.00 kg/m   Height: 5' 1.75" (156.8 cm)  General appearance: alert, cooperative and appears stated age Head: Normocephalic, without obvious abnormality, atraumatic Neck: no adenopathy, supple, symmetrical, trachea midline and thyroid normal to inspection and palpation Lungs: clear to auscultation bilaterally Breasts: normal appearance, no masses or tenderness Heart: regular rate and rhythm Abdomen: soft, non-tender; bowel sounds normal; no masses,  no organomegaly Extremities: extremities normal, atraumatic, no cyanosis or  edema Skin: Skin color, texture, turgor normal. No rashes or lesions Lymph nodes: Cervical, supraclavicular, and axillary nodes normal. No abnormal inguinal nodes palpated Neurologic: Grossly normal   Pelvic: External genitalia:  no lesions              Urethra:  normal appearing urethra with no masses, tenderness or lesions              Bartholins and Skenes: normal                 Vagina: normal appearing vagina with normal color and discharge, no lesions              Cervix: no lesions              Pap taken: Yes.   Bimanual Exam:  Uterus:  normal size, contour, position, consistency, mobility, non-tender              Adnexa: normal adnexa and no mass, fullness, tenderness               Rectovaginal: Confirms               Anus:  normal sphincter tone, no lesions  Chaperone, Margret Chance, CMA, was present for exam.  Assessment/Plan: 1. Well woman exam with routine gynecological exam - pap and HR HPV obtained today - MMG 05/02/2019 - colonoscopy or cologuard discussed today - Plan BMD closer to age 51 - vaccines updated  2. Endometriosis - drospirenone-ethinyl estradiol (NIKKI) 3-0.02 MG tablet; Take 1 tablet by mouth daily.  Dispense: 84 tablet; Refill: 4  3. History of genital warts  4. Encounter for hepatitis C screening test for low risk patient - Hepatitis C antibody; Future  5. Elevated lipids - Lipid panel; Future  6. Antibody response exam - Varicella zoster antibody, IgG; Future  7. Blood tests for routine general physical examination - Hemoglobin A1c; Future

## 2020-05-02 LAB — VARICELLA ZOSTER ANTIBODY, IGG: Varicella IgG: 1864 index (ref >165)

## 2020-05-09 LAB — CYTOLOGY - PAP
Adequacy: ABSENT
Comment: NEGATIVE
Diagnosis: NEGATIVE
High risk HPV: NEGATIVE

## 2020-05-17 DIAGNOSIS — D225 Melanocytic nevi of trunk: Secondary | ICD-10-CM | POA: Diagnosis not present

## 2020-05-17 DIAGNOSIS — L658 Other specified nonscarring hair loss: Secondary | ICD-10-CM | POA: Diagnosis not present

## 2020-05-17 DIAGNOSIS — L821 Other seborrheic keratosis: Secondary | ICD-10-CM | POA: Diagnosis not present

## 2020-05-17 DIAGNOSIS — L578 Other skin changes due to chronic exposure to nonionizing radiation: Secondary | ICD-10-CM | POA: Diagnosis not present

## 2020-06-05 ENCOUNTER — Other Ambulatory Visit (HOSPITAL_COMMUNITY): Payer: Self-pay | Admitting: *Deleted

## 2020-06-05 MED ORDER — FLUOXETINE HCL 20 MG PO TABS: 40.0000 mg | ORAL_TABLET | Freq: Every day | ORAL | 0 refills | Status: DC

## 2020-06-06 ENCOUNTER — Ambulatory Visit: Payer: BC Managed Care – PPO

## 2020-06-07 ENCOUNTER — Ambulatory Visit
Admission: RE | Admit: 2020-06-07 | Discharge: 2020-06-07 | Disposition: A | Discharge status: Home / Self Care | Payer: BC Managed Care – PPO | Source: Ambulatory Visit | Attending: Obstetrics & Gynecology | Admitting: Obstetrics & Gynecology

## 2020-06-07 ENCOUNTER — Other Ambulatory Visit: Payer: Self-pay

## 2020-06-07 DIAGNOSIS — Z1231 Encounter for screening mammogram for malignant neoplasm of breast: Secondary | ICD-10-CM | POA: Diagnosis not present

## 2020-07-05 ENCOUNTER — Telehealth (INDEPENDENT_AMBULATORY_CARE_PROVIDER_SITE_OTHER): Payer: BC Managed Care – PPO | Admitting: Psychiatry

## 2020-07-05 ENCOUNTER — Encounter (HOSPITAL_COMMUNITY): Payer: Self-pay | Admitting: Psychiatry

## 2020-07-05 DIAGNOSIS — F411 Generalized anxiety disorder: Secondary | ICD-10-CM | POA: Diagnosis not present

## 2020-07-05 DIAGNOSIS — Z9189 Other specified personal risk factors, not elsewhere classified: Secondary | ICD-10-CM | POA: Diagnosis not present

## 2020-07-05 DIAGNOSIS — F429 Obsessive-compulsive disorder, unspecified: Secondary | ICD-10-CM | POA: Diagnosis not present

## 2020-07-05 NOTE — Progress Notes (Signed)
[facility] Outpatient Follow up visit   Patient Identification: Frances Ho MRN:  [id number] Date of Evaluation:  07/05/2020 Referral Source: Primary care Chief Complaint:    follow up depression, anxiety Visit Diagnosis:   GAD Insomnia  amotivation    History of Present Illness:   Virtual Visit via Telephone Note  I connected with Frances Ho on 07/05/20 at  4:30 PM EDT by telephone and verified that I am speaking with the correct person using two identifiers.  Location: Patient: home Provider: offic   I discussed the limitations, risks, security and privacy concerns of performing an evaluation and management service by telephone and the availability of in person appointments. I also discussed with the patient that there may be a patient responsible charge related to this service. The patient expressed understanding and agreed to proceed.     I discussed the assessment and treatment plan with the patient. The patient was provided an opportunity to ask questions and all were answered. The patient agreed with the plan and demonstrated an understanding of the instructions.   The patient was advised to call back or seek an in-person evaluation if the symptoms worsen or if the condition fails to improve as anticipated.  I provided 12  minutes of non-face-to-face time during this encounter.   Thresa Ross, MD     50   years old currently single Caucasian female who is living with her mom. Referred initially by primary care physician for management of sleep, anxiety  and OCD  Patient states at home she tried to get into therapy at [facility] about was not able to since she does not live in the county so we talked about calling for partial program if needed or individual therapy locally  She has done therapy there is services here in the past but wants to have a different one  We talked about wellness course since he remains a motivated otherwise not hopeless  or suicidal  She procrastinates but is not able to make the effort to get involved in some job that she can manage  Has to take care of mom Feels meds are doing what they can does not want to increase Psychosocial stressors discussed , she may look to add job or group therapy  klonopine helps ocd if gets worse, otherwise takes prozac  Aggravating factor: finances, lonliness Modifying factor: Home, family  no psychosis     Past Medical History:  Past Medical History:  Diagnosis Date   Abnormal Pap smear of cervix 2009   Anxiety    Chronic headaches    Depression    Diastolic dysfunction 03/12/2016   Endometriosis    Genital warts    Heart murmur    Mitral valve prolapse    mild bileaflet MVP with mild MR on echo 12/2019   OCD (obsessive compulsive disorder)    OSA (obstructive sleep apnea) 07/30/2010   Tachycardia     Past Surgical History:  Procedure Laterality Date   COLPOSCOPY  3/09   w/ECC-neg   FOOT SURGERY  2005   left   PELVIC LAPAROSCOPY Bilateral 11/13/03   laparoscopic removal bilateral endometriomas    Family Psychiatric History: Mom : depression and anxiety  Family History:  Family History  Problem Relation Age of Onset   Skin cancer Mother    Colon polyps Brother 10       colon polyps   Diabetes Other        grandfather   Hypertension Other  grandfather/grandmother   Heart disease Maternal Grandmother    Heart disease Maternal Grandfather    Heart disease Paternal Grandmother    Heart disease Paternal Grandfather     Social History:   Social History   Socioeconomic History   Marital status: Single    Spouse name: Not on file   Number of children: 0   Years of education: Not on file   Highest education level: Not on file  Occupational History   Occupation: clerical at [employer]  Tobacco Use   Smoking status: Never   Smokeless tobacco: Never  Vaping Use   Vaping Use: Never used  Substance and Sexual Activity   Alcohol use:  No   Drug use: No   Sexual activity: Not Currently    Partners: Male    Birth control/protection: Pill    Comment: yaz  Other Topics Concern   Not on file  Social History Narrative   Pt is single and lives with family.    Social Determinants of Health   Financial Resource Strain: Not on file  Food Insecurity: Not on file  Transportation Needs: Not on file  Physical Activity: Not on file  Stress: Not on file  Social Connections: Not on file     Allergies:  No Known Allergies  Metabolic Disorder Labs: Lab Results  Component Value Date   HGBA1C 5.1 05/01/2020   MPG 99.67 05/01/2020   No results found for: PROLACTIN Lab Results  Component Value Date   CHOL 230 (H) 05/01/2020   TRIG 314 (H) 05/01/2020   HDL 65 05/01/2020   CHOLHDL 3.5 05/01/2020   VLDL 63 (H) 05/01/2020   LDLCALC 102 (H) 05/01/2020   LDLCALC 144 (H) 01/31/2019     Current Medications: Current Outpatient Medications  Medication Sig Dispense Refill   B Complex Vitamins (B COMPLEX PO) Take by mouth.     BIOTIN PO Take 1,000 mg by mouth.     clonazePAM (KLONOPIN) 0.5 MG tablet Take 1 tablet (0.5 mg total) by mouth 2 (two) times daily as needed for anxiety. 30 tablet 0   DENTA 5000 PLUS 1.1 % CREA dental cream Take 1 application by mouth daily.     drospirenone-ethinyl estradiol (NIKKI) 3-0.02 MG tablet Take 1 tablet by mouth daily. 84 tablet 4   Fluocinolone Acetonide Body 0.01 % OIL Apply 1 application topically 2 (two) times daily. (Patient not taking: Reported on 05/01/2020)     FLUoxetine (PROZAC) 20 MG tablet Take 2 tablets (40 mg total) by mouth daily. 180 tablet 0   GLUCOSA-CHONDR-NA CHONDR-MSM PO Take 1,500 mg by mouth daily.     Magnesium 100 MG CAPS Take 100 mg by mouth 2 (two) times daily.     Methylsulfonylmethane (MSM) 1000 MG CAPS Take 1 capsule by mouth daily.     Multiple Vitamin (MULTIVITAMIN) tablet Take 1 tablet by mouth daily.     No current facility-administered medications for this  visit.      Psychiatric Specialty Exam: Review of Systems  Psychiatric/Behavioral:  Negative for substance abuse and suicidal ideas.    There were no vitals taken for this visit.There is no height or weight on file to calculate BMI.  General Appearance:   Eye Contact:  Speech:  Normal Rate  Volume:  Decreased  Mood: somewhat subdued  Affect:  congruent  Thought Process:  Goal Directed  Orientation:  Full (Time, Place, and Person)  Thought Content:  Rumination  Suicidal Thoughts:  No  Homicidal Thoughts:  No  Memory:  Immediate;   Fair Recent;   Fair  Judgement:  Fair  Insight:  Fair  Psychomotor Activity:  Decreased  Concentration:  Concentration: Fair and Attention Span: Fair  Recall:  [employer] of Knowledge:Fair  Language: Fair  Akathisia:  Negative  Handed:  Right  AIMS (if indicated):    Assets:  Desire for Improvement  ADL's:  Intact  Cognition: WNL  Sleep:  poor    Treatment Plan Summary: Medication management and Plan as follows    GAD and panic symptoms: Worries related to finances she may plan to look for some job that she can do from home but remains a motivated so we discussed about Systems developer we discussed about motivation and how to prioritize things continue Prozac she does not want to add or change any medication    continue prn klonopine OCD: f fluctuates continue Prozac and Klonopin  Depression:s somewhat subdued discussed options and individual therapy and how to work on Pharmacologist and a motivation continue Prozac  Fu 2 m. She wants to come in  4 months  Thresa Ross, MD 6/9/20224:45 PM

## 2020-07-12 ENCOUNTER — Telehealth (HOSPITAL_COMMUNITY): Payer: BC Managed Care – PPO | Admitting: Psychiatry

## 2020-08-06 DIAGNOSIS — L661 Lichen planopilaris: Secondary | ICD-10-CM | POA: Diagnosis not present

## 2020-08-06 DIAGNOSIS — L650 Telogen effluvium: Secondary | ICD-10-CM | POA: Diagnosis not present

## 2020-08-06 DIAGNOSIS — L219 Seborrheic dermatitis, unspecified: Secondary | ICD-10-CM | POA: Diagnosis not present

## 2020-08-30 ENCOUNTER — Other Ambulatory Visit (HOSPITAL_COMMUNITY): Payer: Self-pay | Admitting: Psychiatry

## 2020-08-31 NOTE — Telephone Encounter (Signed)
Last VV--07/05/20 Last refill--06/05/20

## 2020-09-19 ENCOUNTER — Telehealth (HOSPITAL_BASED_OUTPATIENT_CLINIC_OR_DEPARTMENT_OTHER): Payer: Self-pay | Admitting: Obstetrics & Gynecology

## 2020-09-19 NOTE — Telephone Encounter (Signed)
Patient called and would like for Dr.Miller give her the name of a female doctor do her colonoscopy.

## 2020-09-25 ENCOUNTER — Telehealth (HOSPITAL_BASED_OUTPATIENT_CLINIC_OR_DEPARTMENT_OTHER): Payer: Self-pay | Admitting: Obstetrics & Gynecology

## 2020-09-25 NOTE — Telephone Encounter (Signed)
Patient called and when like to know who Dr.Miller recommends  for colonoscopy?

## 2020-09-26 NOTE — Telephone Encounter (Signed)
LMOVM that Dr.Miller recommends Dr. Leone Payor at [facility] or one of the 2 female providers there. Advised to call with any concerns.

## 2020-09-28 ENCOUNTER — Encounter: Payer: Self-pay | Admitting: Gastroenterology

## 2020-11-01 ENCOUNTER — Encounter (HOSPITAL_COMMUNITY): Payer: Self-pay | Admitting: Psychiatry

## 2020-11-01 ENCOUNTER — Telehealth (INDEPENDENT_AMBULATORY_CARE_PROVIDER_SITE_OTHER): Payer: BC Managed Care – PPO | Admitting: Psychiatry

## 2020-11-01 DIAGNOSIS — Z9189 Other specified personal risk factors, not elsewhere classified: Secondary | ICD-10-CM

## 2020-11-01 DIAGNOSIS — F429 Obsessive-compulsive disorder, unspecified: Secondary | ICD-10-CM

## 2020-11-01 DIAGNOSIS — F411 Generalized anxiety disorder: Secondary | ICD-10-CM | POA: Diagnosis not present

## 2020-11-01 MED ORDER — CLONAZEPAM 0.5 MG PO TABS: 0.5000 mg | ORAL_TABLET | Freq: Two times a day (BID) | ORAL | 0 refills | Status: DC | PRN

## 2020-11-01 NOTE — Progress Notes (Signed)
[name] Outpatient Follow up visit   Patient Identification: Frances Ho MRN:  [id number] Date of Evaluation:  11/01/2020 Referral Source: Primary care Chief Complaint:    follow up depression, anxiety Visit Diagnosis:   GAD Insomnia  amotivation   Virtual Visit via Telephone Note  I connected with Frances Ho on 11/01/20 at  4:00 PM EDT by telephone and verified that I am speaking with the correct person using two identifiers.  Location: Patient: home Provider: office   I discussed the limitations, risks, security and privacy concerns of performing an evaluation and management service by telephone and the availability of in person appointments. I also discussed with the patient that there may be a patient responsible charge related to this service. The patient expressed understanding and agreed to proceed.      I discussed the assessment and treatment plan with the patient. The patient was provided an opportunity to ask questions and all were answered. The patient agreed with the plan and demonstrated an understanding of the instructions.   The patient was advised to call back or seek an in-person evaluation if the symptoms worsen or if the condition fails to improve as anticipated.  I provided 11 minutes of non-face-to-face time during this encounter.   HPI:  50   years old currently single Caucasian female who is living with her mom. Referred initially by primary care physician for management of sleep, anxiety  and OCD  Still struggles with amotivation and gets subdued, trying to look for online job Discussed wellness coach but has not made appointment yet She plans to make appointment with [facility] locally for partial program  Klonopine helps with anxiety but gets subdued, prozac helps depression but does not want to increase  She procrastinates but is not able to make the effort to get involved in some job that she can manage  Has to take care of  mom Aggravating factor: finances, llonliness Modifying factor: Home, family  no psychosis     Past Medical History:  Past Medical History:  Diagnosis Date   Abnormal Pap smear of cervix 2009   Anxiety    Chronic headaches    Depression    Diastolic dysfunction 03/12/2016   Endometriosis    Genital warts    Heart murmur    Mitral valve prolapse    mild bileaflet MVP with mild MR on echo 12/2019   OCD (obsessive compulsive disorder)    OSA (obstructive sleep apnea) 07/30/2010   Tachycardia     Past Surgical History:  Procedure Laterality Date   COLPOSCOPY  3/09   w/ECC-neg   FOOT SURGERY  2005   left   PELVIC LAPAROSCOPY Bilateral 11/13/03   laparoscopic removal bilateral endometriomas    Family Psychiatric History: Mom : depression and anxiety  Family History:  Family History  Problem Relation Age of Onset   Skin cancer Mother    Colon polyps Brother 66       colon polyps   Diabetes Other        grandfather   Hypertension Other        grandfather/grandmother   Heart disease Maternal Grandmother    Heart disease Maternal Grandfather    Heart disease Paternal Grandmother    Heart disease Paternal Grandfather     Social History:   Social History   Socioeconomic History   Marital status: Single    Spouse name: Not on file   Number of children: 0   Years of education: Not  on file   Highest education level: Not on file  Occupational History   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    Social Determinants of Health   Financial Resource Strain: Not on file  Food Insecurity: Not on file   Transportation Needs: Not on file  Physical Activity: Not on file  Stress: Not on file  Social Connections: Not on file     Allergies:  No Known Allergies  Metabolic Disorder Labs: Lab Results  Component Value Date   HGBA1C 5.1 05/01/2020   MPG 99.67 05/01/2020   No results found for: PROLACTIN Lab Results  Component Value Date   CHOL 230 (H) 05/01/2020   TRIG 314 (H) 05/01/2020   HDL 65 05/01/2020   CHOLHDL 3.5 05/01/2020   VLDL 63 (H) 05/01/2020   LDLCALC 102 (H) 05/01/2020   LDLCALC 144 (H) 01/31/2019     Current Medications: Current Outpatient Medications  Medication Sig Dispense Refill   B Complex Vitamins (B COMPLEX PO) Take by mouth.     BIOTIN PO Take 1,000 mg by mouth.     clonazePAM (KLONOPIN) 0.5 MG tablet Take 1 tablet (0.5 mg total) by mouth 2 (two) times daily as needed for anxiety. 30 tablet 0   DENTA 5000 PLUS 1.1 % CREA dental cream Take 1 application by mouth daily.     drospirenone-ethinyl estradiol (NIKKI) 3-0.02 MG tablet Take 1 tablet by mouth daily. 84 tablet 4   Fluocinolone Acetonide Body 0.01 % OIL Apply 1 application topically 2 (two) times daily. (Patient not taking: Reported on 05/01/2020)     FLUoxetine (PROZAC) 20 MG tablet TAKE TWO TABLETS BY MOUTH DAILY 180 tablet 0   GLUCOSA-CHONDR-NA CHONDR-MSM PO Take 1,500 mg by mouth daily.     Magnesium 100 MG CAPS Take 100 mg by mouth 2 (two) times daily.     Methylsulfonylmethane (MSM) 1000 MG CAPS Take 1 capsule by mouth daily.     Multiple Vitamin (MULTIVITAMIN) tablet Take 1 tablet by mouth daily.     No current facility-administered medications for this visit.      Psychiatric Specialty Exam: Review of Systems  Psychiatric/Behavioral:  Negative for substance abuse and suicidal ideas.    There were no vitals taken for this visit.There is no height or weight on file to calculate BMI.  General Appearance:   Eye Contact:  Speech:  Normal Rate  Volume:  Decreased  Mood: somewhat subdued   Affect:  congruent  Thought Process:  Goal Directed  Orientation:  Full (Time, Place, and Person)  Thought Content:  Rumination  Suicidal Thoughts:  No  Homicidal Thoughts:  No  Memory:  Immediate;   Fair Recent;   Fair  Judgement:  Fair  Insight:  Fair  Psychomotor Activity:  Decreased  Concentration:  Concentration: Fair and Attention Span: Fair  Recall:  [employer] of Knowledge:Fair  Language: Fair  Akathisia:  Negative  Handed:  Right  AIMS (if indicated):    Assets:  Desire for Improvement  ADL's:  Intact  Cognition: WNL  Sleep:  poor    Treatment Plan Summary: Medication management and Plan as follows    Prior documentation reviewed  GAD and panic symptoms: worries fluctuates, continue prozac and klonopine, consider therapy and discussed to add activities    OCD: not worse, fluctuates continue Prozac and Klonopin  Depression: gets subded, feels lonely, discussed stressors consider therapy and work, continue prozac Work on motivation  Thresa Ross, MD 10/6/20224:16 PM Patient ID: Frances Ho, female   DOB: 15-Jul-1970, 50 y.o.   MRN: [id number]

## 2020-11-02 ENCOUNTER — Ambulatory Visit: Payer: BC Managed Care – PPO | Admitting: Gastroenterology

## 2020-11-12 DIAGNOSIS — H5203 Hypermetropia, bilateral: Secondary | ICD-10-CM | POA: Diagnosis not present

## 2020-11-12 DIAGNOSIS — H524 Presbyopia: Secondary | ICD-10-CM | POA: Diagnosis not present

## 2020-11-12 DIAGNOSIS — H43393 Other vitreous opacities, bilateral: Secondary | ICD-10-CM | POA: Diagnosis not present

## 2020-11-12 DIAGNOSIS — H04123 Dry eye syndrome of bilateral lacrimal glands: Secondary | ICD-10-CM | POA: Diagnosis not present

## 2020-11-27 ENCOUNTER — Encounter: Payer: Self-pay | Admitting: Gastroenterology

## 2020-11-27 ENCOUNTER — Ambulatory Visit: Payer: BC Managed Care – PPO | Admitting: Gastroenterology

## 2020-11-27 ENCOUNTER — Encounter (INDEPENDENT_AMBULATORY_CARE_PROVIDER_SITE_OTHER): Payer: Self-pay

## 2020-11-27 VITALS — BP 102/70 | HR 102 | Ht 61.75 in | Wt 142.0 lb

## 2020-11-27 DIAGNOSIS — K625 Hemorrhage of anus and rectum: Secondary | ICD-10-CM

## 2020-11-27 DIAGNOSIS — R1031 Right lower quadrant pain: Secondary | ICD-10-CM | POA: Diagnosis not present

## 2020-11-27 DIAGNOSIS — Z8742 Personal history of other diseases of the female genital tract: Secondary | ICD-10-CM | POA: Diagnosis not present

## 2020-11-27 DIAGNOSIS — R1032 Left lower quadrant pain: Secondary | ICD-10-CM

## 2020-11-27 DIAGNOSIS — K5904 Chronic idiopathic constipation: Secondary | ICD-10-CM | POA: Diagnosis not present

## 2020-11-27 MED ORDER — SUTAB 1479-225-188 MG PO TABS: ORAL_TABLET | ORAL | 0 refills | Status: DC

## 2020-11-27 MED ORDER — DICYCLOMINE HCL 10 MG PO CAPS: 10.0000 mg | ORAL_CAPSULE | Freq: Three times a day (TID) | ORAL | 0 refills | Status: DC | PRN

## 2020-11-27 MED ORDER — ONDANSETRON HCL 4 MG PO TABS: 4.0000 mg | ORAL_TABLET | Freq: Three times a day (TID) | ORAL | 0 refills | Status: DC | PRN

## 2020-11-27 NOTE — Patient Instructions (Addendum)
You have been scheduled for a colonoscopy. Please follow written instructions given to you at your visit today.  Please pick up your prep supplies at the pharmacy within the next 1-3 days. If you use inhalers (even only as needed), please bring them with you on the day of your procedure.   We have sent Zofran to your pharmacy to use for nausea with your prep every 8 hours as needed  Use Stool softener daily/Docusate 1 tablet daily  We sent Dicyclomine to your pharmacy   Follow up in 4 months  Due to recent changes in healthcare laws, you may see the results of your imaging and laboratory studies on MyChart before your provider has had a chance to review them.  We understand that in some cases there may be results that are confusing or concerning to you. Not all laboratory results come back in the same time frame and the provider may be waiting for multiple results in order to interpret others.  Please give [place] 48 hours in order for your provider to thoroughly review all the results before contacting the office for clarification of your results.    If you are age 79 or older, your body mass index should be between 23-30. Your Body mass index is 26.18 kg/m. If this is out of the aforementioned range listed, please consider follow up with your Primary Care Provider.  If you are age 62 or younger, your body mass index should be between 19-25. Your Body mass index is 26.18 kg/m. If this is out of the aformentioned range listed, please consider follow up with your Primary Care Provider.   ________________________________________________________  The [facility] providers would like to encourage you to use [facility] to communicate with providers for non-urgent requests or questions.  Due to long hold times on the telephone, sending your provider a message by [facility] may be a faster and more efficient way to get a response.  Please allow 48 business hours for a response.  Please remember that this is for  non-urgent requests.  _______________________________________________________   Thank you for choosing [facility]  Kavitha Nandigam,MD

## 2020-11-27 NOTE — Progress Notes (Signed)
Frances Ho    [id number]    [birth year]  Primary Care Physician:Patient, No Pcp Per (Inactive)  Referring Physician: No referring provider defined for this encounter.   Chief complaint:  Abdominal pain, constipation  HPI:  50 year old very pleasant female with complaints of worsening constipation, abdominal pain and intermittent bright red blood per rectum She has constipation with bowel movement every 2 to 3 days and usually has abdominal pain that last for few minutes after a bowel movement in the lower abdomen.  It self resolves and she usually does not take any medication to improve the abdominal pain She has history of endometriosis, is s/p hysterectomy  She has been having intermittent rectal bleeding for many years, feels its related to hemorrhoids No family history of colon cancer or GI malignancy   Outpatient Encounter Medications as of 11/27/2020  Medication Sig   B Complex Vitamins (B COMPLEX PO) Take 1 tablet by mouth daily.   BIOTIN PO Take 1,000 mg by mouth.   clonazePAM (KLONOPIN) 0.5 MG tablet Take 1 tablet (0.5 mg total) by mouth 2 (two) times daily as needed for anxiety.   DENTA 5000 PLUS 1.1 % CREA dental cream Take 1 application by mouth daily.   drospirenone-ethinyl estradiol (NIKKI) 3-0.02 MG tablet Take 1 tablet by mouth daily.   Fluocinolone Acetonide Body 0.01 % OIL Apply 1 application topically 2 (two) times daily.   FLUoxetine (PROZAC) 20 MG tablet TAKE TWO TABLETS BY MOUTH DAILY   GLUCOSA-CHONDR-NA CHONDR-MSM PO Take 1,500 mg by mouth daily.   Magnesium 100 MG CAPS Take 100 mg by mouth 2 (two) times daily.   Methylsulfonylmethane (MSM) 1000 MG CAPS Take 1 capsule by mouth daily.   Multiple Vitamin (MULTIVITAMIN) tablet Take 1 tablet by mouth daily.   No facility-administered encounter medications on file as of 11/27/2020.    Allergies as of 11/27/2020   (No Known Allergies)    Past Medical History:  Diagnosis Date    Abnormal Pap smear of cervix 2009   Anxiety    Chronic headaches    Depression    Diastolic dysfunction 03/12/2016   Endometriosis    Genital warts    Heart murmur    Mitral valve prolapse    mild bileaflet MVP with mild MR on echo 12/2019   OCD (obsessive compulsive disorder)    OSA (obstructive sleep apnea) 07/30/2010   Tachycardia     Past Surgical History:  Procedure Laterality Date   COLPOSCOPY  3/09   w/ECC-neg   FOOT SURGERY  2005   left   PELVIC LAPAROSCOPY Bilateral 11/13/03   laparoscopic removal bilateral endometriomas    Family History  Problem Relation Age of Onset   Skin cancer Mother    Irritable bowel syndrome Mother    Colon polyps Brother 35       colon polyps   Heart disease Maternal Grandmother    Heart disease Maternal Grandfather    Heart disease Paternal Grandmother    Diabetes Paternal Grandfather    Heart disease Paternal Grandfather    Diabetes Other        grandfather   Hypertension Other        grandfather/grandmother   Esophageal cancer Neg Hx    Pancreatic cancer Neg Hx    Stomach cancer Neg Hx     Social History   Socioeconomic History   Marital status: Single    Spouse name: Not on file  Number of children: 0   Years of education: Not on file   Highest education level: Not on file  Occupational History   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    Social Determinants of Health   Financial Resource Strain: Not on file  Food Insecurity: Not on file  Transportation Needs: Not on file  Physical Activity: Not on file  Stress: Not on file  Social Connections:  Not on file  Intimate Partner Violence: Not on file      Review of systems: All other review of systems negative except as mentioned in the HPI.   Physical Exam: Vitals:   11/27/20 1432  BP: 102/70  Pulse: (!) 102  SpO2: 98%   Body mass index is 26.18 kg/m. Gen:      No acute distress HEENT:  sclera anicteric Abd:      soft, non-tender; no palpable masses, no distension Ext:    No edema Neuro: alert and oriented x 3 Psych: normal mood and affect  Data Reviewed:  Reviewed labs, radiology imaging, old records and pertinent past GI work up   Assessment and Plan/Recommendations:  50 year old very pleasant female with history of endometriosis s/p hysterectomy with complaints of lower abdominal discomfort, chronic idiopathic constipation and intermittent bright red blood per rectum  Chronic idiopathic constipation: Start daily stool softener docusate 1 tablet daily Increase dietary fiber and water intake  Intermittent bright red blood per rectum likely secondary to bleeding from internal hemorrhoids but will need to exclude neoplastic lesion or proctitis. Schedule for colonoscopy for further evaluation Patient prefers to do the exam unsedated and also requested only females in the room, I informed patient that we will try our best but with staffing situation may not be able to guarantee that cRNA will be a female as well.  The risks and benefits as well as alternatives of endoscopic procedure(s) have been discussed and reviewed. All questions answered. The patient agrees to proceed.   Use dicyclomine 10 mg twice daily as needed for severe abdominal cramping or lower abdominal discomfort  Return in 4 months   The patient was provided an opportunity to ask questions and all were answered. The patient agreed with the plan and demonstrated an understanding of the instructions.  Iona Beard , MD    CC: No ref. provider found

## 2020-11-30 ENCOUNTER — Encounter: Payer: Self-pay | Admitting: Gastroenterology

## 2020-12-03 ENCOUNTER — Other Ambulatory Visit (HOSPITAL_COMMUNITY): Payer: Self-pay | Admitting: Psychiatry

## 2020-12-05 ENCOUNTER — Telehealth: Payer: Self-pay | Admitting: Gastroenterology

## 2020-12-05 NOTE — Telephone Encounter (Signed)
Patient called states [facility] did not get the script for the Sutab prep please resend.

## 2020-12-06 MED ORDER — SUTAB 1479-225-188 MG PO TABS: ORAL_TABLET | ORAL | 0 refills | Status: DC

## 2020-12-06 NOTE — Telephone Encounter (Signed)
It was sent to [facility] on 11/1 but will resend

## 2020-12-12 ENCOUNTER — Ambulatory Visit: Payer: BC Managed Care – PPO | Admitting: Cardiology

## 2020-12-24 ENCOUNTER — Encounter: Payer: BC Managed Care – PPO | Admitting: Gastroenterology

## 2020-12-31 ENCOUNTER — Other Ambulatory Visit: Payer: Self-pay | Admitting: Gastroenterology

## 2021-01-01 ENCOUNTER — Encounter: Payer: Self-pay | Admitting: Gastroenterology

## 2021-01-01 ENCOUNTER — Telehealth: Payer: Self-pay

## 2021-01-01 ENCOUNTER — Ambulatory Visit (AMBULATORY_SURGERY_CENTER): Payer: BC Managed Care – PPO | Admitting: Gastroenterology

## 2021-01-01 VITALS — BP 107/66 | HR 71 | Temp 97.7°F | Resp 14

## 2021-01-01 DIAGNOSIS — Z1211 Encounter for screening for malignant neoplasm of colon: Secondary | ICD-10-CM | POA: Diagnosis not present

## 2021-01-01 MED ORDER — SODIUM CHLORIDE 0.9 % IV SOLN: 500.0000 mL | INTRAVENOUS | Status: DC

## 2021-01-01 NOTE — Progress Notes (Signed)
[facility] History and Physical   Primary Care Physician:  Patient, No Pcp Per (Inactive)   Reason for Procedure:  Colorectal cancer screening  Plan:    Screening colonoscopy with possible interventions as needed     HPI: Frances Ho is a very pleasant 50 y.o. female here for screening colonoscopy. She has intermittent rectal bleeding, abdominal discomfort and chronic constipation. Please refer to office visit note 11/27/20 for additional details  The risks and benefits as well as alternatives of endoscopic procedure(s) have been discussed and reviewed. All questions answered. The patient agrees to proceed.    Past Medical History:  Diagnosis Date   Abnormal Pap smear of cervix 2009   Anxiety    Chronic headaches    Depression    Diastolic dysfunction 03/12/2016   Endometriosis    Genital warts    Heart murmur    Mitral valve prolapse    mild bileaflet MVP with mild MR on echo 12/2019   OCD (obsessive compulsive disorder)    OSA (obstructive sleep apnea) 07/30/2010   Tachycardia     Past Surgical History:  Procedure Laterality Date   COLPOSCOPY  3/09   w/ECC-neg   FOOT SURGERY  2005   left   PELVIC LAPAROSCOPY Bilateral 11/13/03   laparoscopic removal bilateral endometriomas    Prior to Admission medications   Medication Sig Start Date End Date Taking? Authorizing Provider  B Complex Vitamins (B COMPLEX PO) Take 1 tablet by mouth daily.   Yes [provider]  BIOTIN PO Take 1,000 mg by mouth.   Yes [provider]  clonazePAM (KLONOPIN) 0.5 MG tablet Take 1 tablet (0.5 mg total) by mouth 2 (two) times daily as needed for anxiety. 11/01/20  Yes Thresa Ross, MD  DENTA 5000 PLUS 1.1 % CREA dental cream Take 1 application by mouth daily. 06/24/19  Yes [provider]  drospirenone-ethinyl estradiol (NIKKI) 3-0.02 MG tablet Take 1 tablet by mouth daily. 05/01/20  Yes Jerene Bears, MD  Fluocinolone Acetonide Body 0.01 %  OIL Apply 1 application topically 2 (two) times daily. 08/22/19  Yes [provider]  FLUoxetine (PROZAC) 20 MG tablet TAKE TWO TABLETS BY MOUTH DAILY 12/04/20  Yes Thresa Ross, MD  Magnesium 100 MG CAPS Take 100 mg by mouth 2 (two) times daily.   Yes [provider]  Methylsulfonylmethane (MSM) 1000 MG CAPS Take 1 capsule by mouth daily.   Yes [provider]  Multiple Vitamin (MULTIVITAMIN) tablet Take 1 tablet by mouth daily.   Yes [provider]  ondansetron (ZOFRAN) 4 MG tablet Take 1 tablet (4 mg total) by mouth every 8 (eight) hours as needed for nausea or vomiting. To use with colonoscopy prep 11/27/20  Yes Kasumi Ditullio, Eleonore Chiquito, MD  dicyclomine (BENTYL) 10 MG capsule TAKE 1 CAPSULE (10 MG TOTAL) BY MOUTH EVERY 8 (EIGHT) HOURS AS NEEDED FOR SPASMS. Patient not taking: Reported on 01/01/2021 12/31/20   Napoleon Form, MD  GLUCOSA-CHONDR-NA CHONDR-MSM PO Take 1,500 mg by mouth daily. Patient not taking: Reported on 01/01/2021    [provider]    Current Outpatient Medications  Medication Sig Dispense Refill   B Complex Vitamins (B COMPLEX PO) Take 1 tablet by mouth daily.     BIOTIN PO Take 1,000 mg by mouth.     clonazePAM (KLONOPIN) 0.5 MG tablet Take 1 tablet (0.5 mg total) by mouth 2 (two) times daily as needed for anxiety. 30 tablet 0   DENTA 5000  PLUS 1.1 % CREA dental cream Take 1 application by mouth daily.     drospirenone-ethinyl estradiol (NIKKI) 3-0.02 MG tablet Take 1 tablet by mouth daily. 84 tablet 4   Fluocinolone Acetonide Body 0.01 % OIL Apply 1 application topically 2 (two) times daily.     FLUoxetine (PROZAC) 20 MG tablet TAKE TWO TABLETS BY MOUTH DAILY 180 tablet 0   Magnesium 100 MG CAPS Take 100 mg by mouth 2 (two) times daily.     Methylsulfonylmethane (MSM) 1000 MG CAPS Take 1 capsule by mouth daily.     Multiple Vitamin (MULTIVITAMIN) tablet Take 1 tablet by mouth daily.     ondansetron (ZOFRAN) 4 MG tablet Take 1  tablet (4 mg total) by mouth every 8 (eight) hours as needed for nausea or vomiting. To use with colonoscopy prep 6 tablet 0   dicyclomine (BENTYL) 10 MG capsule TAKE 1 CAPSULE (10 MG TOTAL) BY MOUTH EVERY 8 (EIGHT) HOURS AS NEEDED FOR SPASMS. (Patient not taking: Reported on 01/01/2021) 90 capsule 0   GLUCOSA-CHONDR-NA CHONDR-MSM PO Take 1,500 mg by mouth daily. (Patient not taking: Reported on 01/01/2021)     Current Facility-Administered Medications  Medication Dose Route Frequency Provider Last Rate Last Admin   0.9 %  sodium chloride infusion  500 mL Intravenous Continuous Arkel Cartwright, Eleonore Chiquito, MD        Allergies as of 01/01/2021   (No Known Allergies)    Family History  Problem Relation Age of Onset   Skin cancer Mother    Irritable bowel syndrome Mother    Colon polyps Brother 47       colon polyps   Heart disease Maternal Grandmother    Heart disease Maternal Grandfather    Heart disease Paternal Grandmother    Diabetes Paternal Grandfather    Heart disease Paternal Grandfather    Diabetes Other        grandfather   Hypertension Other        grandfather/grandmother   Esophageal cancer Neg Hx    Pancreatic cancer Neg Hx    Stomach cancer Neg Hx     Social History   Socioeconomic History   Marital status: Single    Spouse name: Not on file   Number of children: 0   Years of education: Not on file   Highest education level: Not on file  Occupational History   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    Social Determinants of Health   Financial Resource Strain: Not on file  Food Insecurity: Not on  file  Transportation Needs: Not on file  Physical Activity: Not on file  Stress: Not on file  Social Connections: Not on file  Intimate Partner Violence: Not on file    Review of Systems:  All other review of systems negative except as mentioned in the HPI.  Physical Exam: Vital signs in last 24 hours: BP 114/64   Pulse 99   Temp 97.7 F (36.5 C)  General:   Alert, NAD Lungs:  Clear .  Heart:  Regular rate and rhythm Abdomen:  Soft, nontender and nondistended. Neuro/Psych:  Alert and cooperative. Normal mood and affect. A and O x 3  Reviewed labs, radiology imaging, old records and pertinent past GI work up  Patient is appropriate for planned procedure(s) and anesthesia in an ambulatory setting   K. Denzil Magnuson , MD [phone]

## 2021-01-01 NOTE — Telephone Encounter (Signed)
Pt has a colonoscopy scheduled today at 2:30 pm with Dr. Lavon Paganini.  Pt called stating that she has vomited 3 times this morning and was not able to drink the recommended liquids but has taken all of the Sutab prep.  Pt asked if she could delay her procedure and come in at a later time in order to drink more fluids.  Pt stated that she did take anti-nausea meds and has stopped vomiting.  Pt stated that her stool is clear, yellow liquid.    Advised pt not to have anymore liquids or nothing in mouth after 11:30 am because it will delay her procedure and to still arrive at 1:30 pm for her scheduled appt time of 2:30 pm.  Informed pt that Dr. Lavon Paganini does not have any available appts for this afternoon but will send note for Dr. Lavon Paganini to review before pt's arrival.

## 2021-01-01 NOTE — Op Note (Signed)
[facility] Patient Name: Frances Ho Procedure Date: 01/01/2021 2:38 PM MRN: [id number] Endoscopist: Napoleon Form , MD Age: 50 Referring MD:  Date of Birth: 11-21-1970 Gender: Female Account #: [id number] Procedure:                Colonoscopy Indications:              Screening for colorectal malignant neoplasm Medicines:                Monitored Anesthesia Care Procedure:                Pre-Anesthesia Assessment:                           - Prior to the procedure, a History and Physical                            was performed, and patient medications and                            allergies were reviewed. The patient's tolerance of                            previous anesthesia was also reviewed. The risks                            and benefits of the procedure and the sedation                            options and risks were discussed with the patient.                            All questions were answered, and informed consent                            was obtained. Prior Anticoagulants: The patient has                            taken no previous anticoagulant or antiplatelet                            agents. ASA Grade Assessment: II - A patient with                            mild systemic disease. After reviewing the risks                            and benefits, the patient was deemed in                            satisfactory condition to undergo the procedure.                           After obtaining informed consent, the colonoscope  was passed under direct vision. Throughout the                            procedure, the patient's blood pressure, pulse, and                            oxygen saturations were monitored continuously. The                            Olympus PCF-H190DL (#[id number]) Colonoscope was                            introduced through the anus and advanced to the the                            terminal  ileum, with identification of the                            appendiceal orifice and IC valve. The colonoscopy                            was performed without difficulty. The patient                            tolerated the procedure well. The quality of the                            bowel preparation was good. The terminal ileum,                            ileocecal valve, appendiceal orifice, and rectum                            were photographed. Scope In: 2:53:59 PM Scope Out: 3:12:41 PM Scope Withdrawal Time: 0 hours 12 minutes 5 seconds  Total Procedure Duration: 0 hours 18 minutes 42 seconds  Findings:                 The perianal and digital rectal examinations were                            normal.                           Non-bleeding external and internal hemorrhoids were                            found during retroflexion. The hemorrhoids were                            medium-sized.                           The exam was otherwise without abnormality. Complications:            No immediate complications. Estimated Blood Loss:  Estimated blood loss was minimal. Impression:               - Non-bleeding external and internal hemorrhoids.                           - The examination was otherwise normal.                           - No specimens collected. Recommendation:           - Patient has a contact number available for                            emergencies. The signs and symptoms of potential                            delayed complications were discussed with the                            patient. Return to normal activities tomorrow.                            Written discharge instructions were provided to the                            patient.                           - Resume previous diet.                           - Continue present medications.                           - Repeat colonoscopy in 10 years for screening                             purposes. Napoleon Form, MD 01/01/2021 3:15:56 PM This report has been signed electronically.

## 2021-01-01 NOTE — Telephone Encounter (Signed)
Got it, thanks!

## 2021-01-01 NOTE — Progress Notes (Signed)
Vss nad transferred to pacu 

## 2021-01-01 NOTE — Patient Instructions (Signed)
Handout on hemorrhoids given to patient.  No repeat colonoscopy for screening purposes for 10 years!!    YOU HAD AN ENDOSCOPIC PROCEDURE TODAY AT THE [facility]:   Refer to the procedure report that was given to you for any specific questions about what was found during the examination.  If the procedure report does not answer your questions, please call your gastroenterologist to clarify.  If you requested that your care partner not be given the details of your procedure findings, then the procedure report has been included in a sealed envelope for you to review at your convenience later.  YOU SHOULD EXPECT: Some feelings of bloating in the abdomen. Passage of more gas than usual.  Walking can help get rid of the air that was put into your GI tract during the procedure and reduce the bloating. If you had a lower endoscopy (such as a colonoscopy or flexible sigmoidoscopy) you may notice spotting of blood in your stool or on the toilet paper. If you underwent a bowel prep for your procedure, you may not have a normal bowel movement for a few days.  Please Note:  You might notice some irritation and congestion in your nose or some drainage.  This is from the oxygen used during your procedure.  There is no need for concern and it should clear up in a day or so.  SYMPTOMS TO REPORT IMMEDIATELY:  Following lower endoscopy (colonoscopy or flexible sigmoidoscopy):  Excessive amounts of blood in the stool  Significant tenderness or worsening of abdominal pains  Swelling of the abdomen that is new, acute  Fever of 100F or higher  For urgent or emergent issues, a gastroenterologist can be reached at any hour by calling [phone]. Do not use MyChart messaging for urgent concerns.    DIET:  We do recommend a small meal at first, but then you may proceed to your regular diet.  Drink plenty of fluids but you should avoid alcoholic beverages for 24 hours.  ACTIVITY:  You should plan to  take it easy for the rest of today and you should NOT DRIVE or use heavy machinery until tomorrow (because of the sedation medicines used during the test).    FOLLOW UP: Our staff will call the number listed on your records 48-72 hours following your procedure to check on you and address any questions or concerns that you may have regarding the information given to you following your procedure. If we do not reach you, we will leave a message.  We will attempt to reach you two times.  During this call, we will ask if you have developed any symptoms of COVID 19. If you develop any symptoms (ie: fever, flu-like symptoms, shortness of breath, cough etc.) before then, please call [phone].  If you test positive for Covid 19 in the 2 weeks post procedure, please call and report this information to [place].    If any biopsies were taken you will be contacted by phone or by letter within the next 1-3 weeks.  Please call us at [phone] if you have not heard about the biopsies in 3 weeks.    SIGNATURES/CONFIDENTIALITY: You and/or your care partner have signed paperwork which will be entered into your electronic medical record.  These signatures attest to the fact that that the information above on your After Visit Summary has been reviewed and is understood.  Full responsibility of the confidentiality of this discharge information lies with you and/or your care-partner.

## 2021-01-01 NOTE — Progress Notes (Signed)
VS by [place]. 

## 2021-01-03 ENCOUNTER — Telehealth: Payer: Self-pay | Admitting: *Deleted

## 2021-01-03 NOTE — Telephone Encounter (Signed)
Attempted f/u phone call. No answer. Left message. °

## 2021-01-03 NOTE — Telephone Encounter (Signed)
  Follow up Call-  Call back number 01/01/2021  Post procedure Call Back phone  # [phone]  Permission to leave phone message Yes  Some recent data might be hidden     Patient questions:  Do you have a fever, pain , or abdominal swelling? No. Pain Score  0 *  Have you tolerated food without any problems? Yes.    Have you been able to return to your normal activities? Yes.    Do you have any questions about your discharge instructions: Diet   No. Medications  No. Follow up visit  No.  Do you have questions or concerns about your Care? No.  Actions: * If pain score is 4 or above: No action needed, pain <4.  Have you developed a fever since your procedure? no  2.   Have you had an respiratory symptoms (SOB or cough) since your procedure? no  3.   Have you tested positive for COVID 19 since your procedure no  4.   Have you had any family members/close contacts diagnosed with the COVID 19 since your procedure?  no   If yes to any of these questions please route to Laverna Pautler, RN and Karlton Lemon, RN

## 2021-01-06 IMAGING — MG DIGITAL SCREENING BILAT W/ TOMO W/ CAD
8 series · 9 of 24 positions shown · non-contrast
Comparison: Previous exam(s).

CLINICAL DATA: Screening.

EXAM:
DIGITAL SCREENING BILATERAL MAMMOGRAM WITH TOMO AND CAD

[L MLO synth-2D]
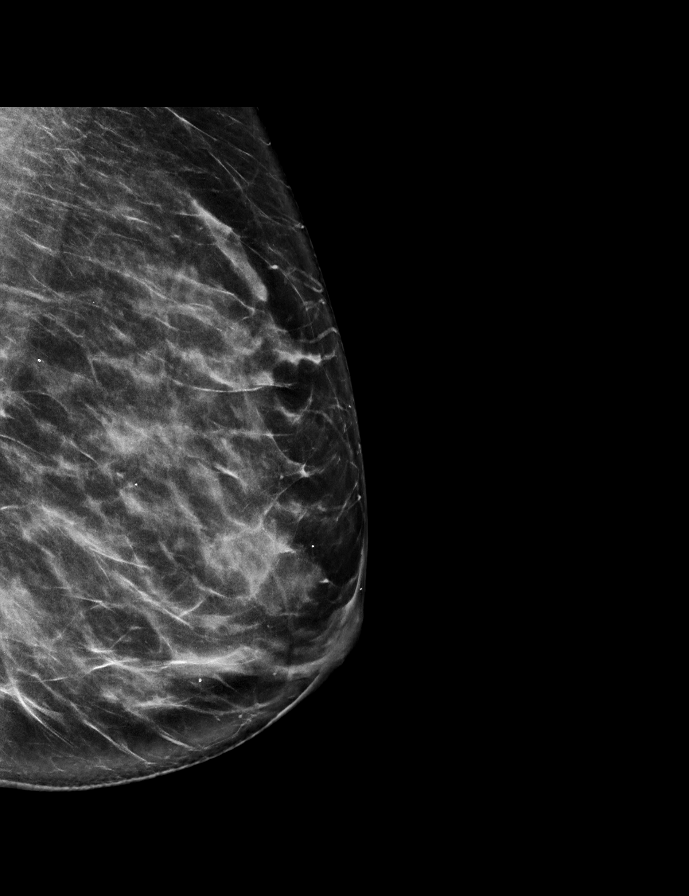

[R CC synth-2D]
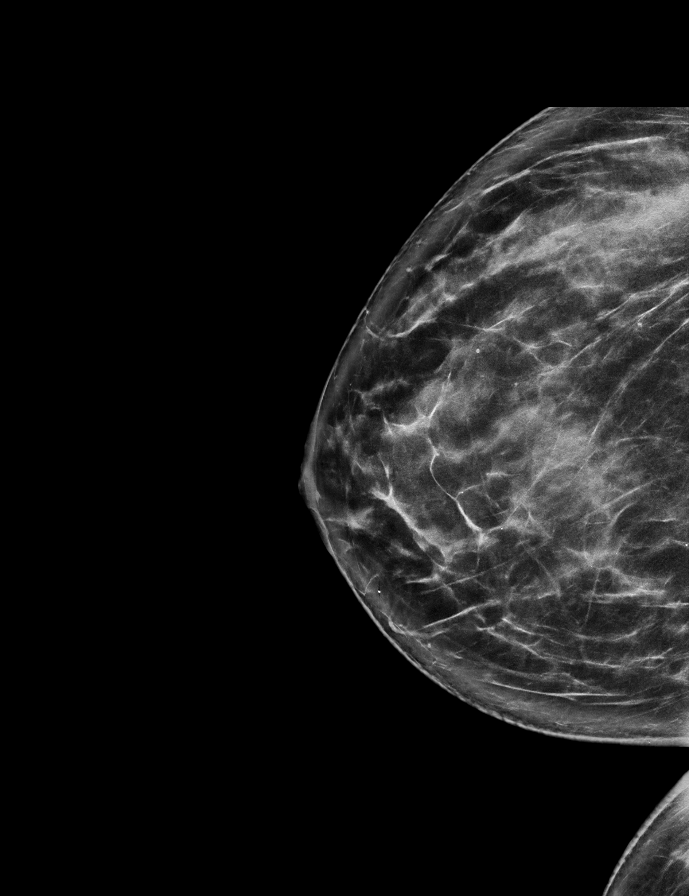

[L CC synth-2D]
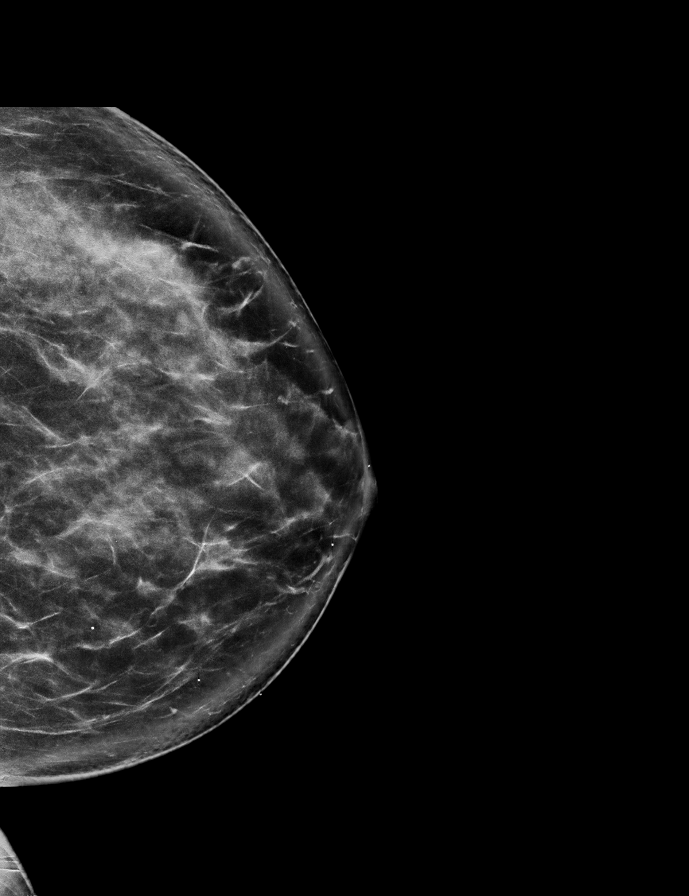

[R MLO synth-2D]
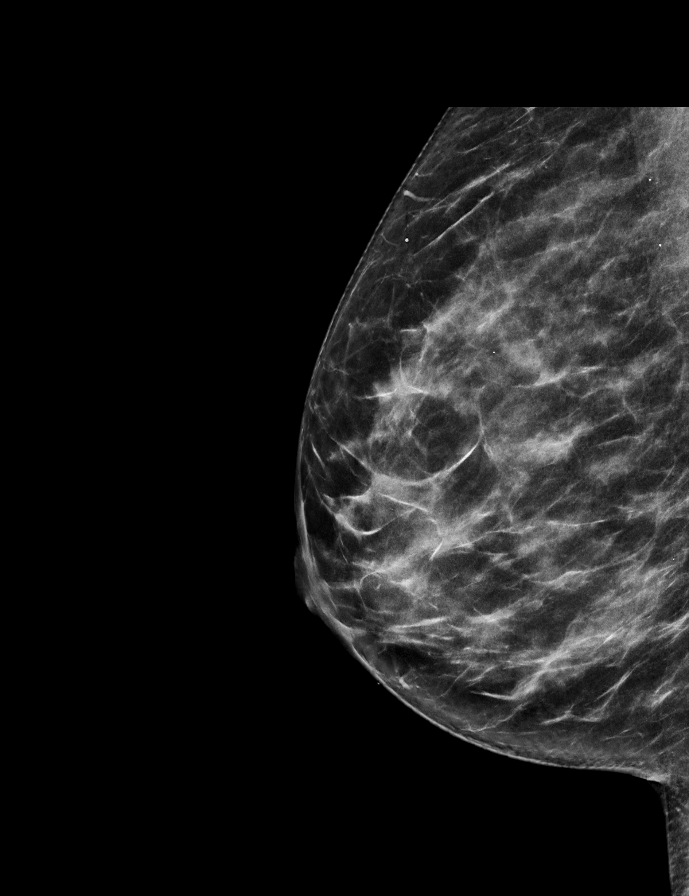

[L CC tomo · 2 of 86 frames shown]
[frame 28/86]
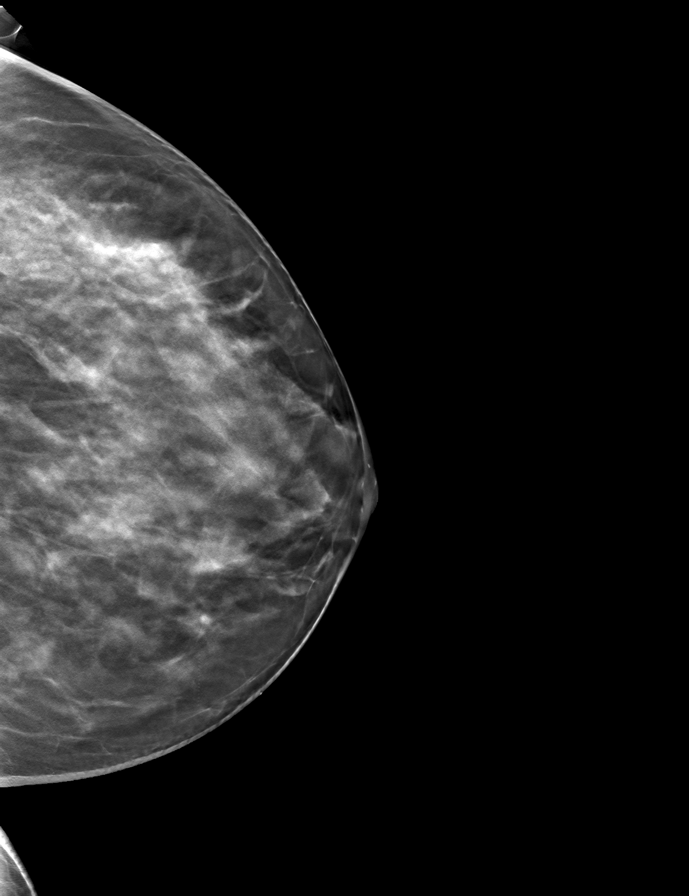
[frame 43/86]
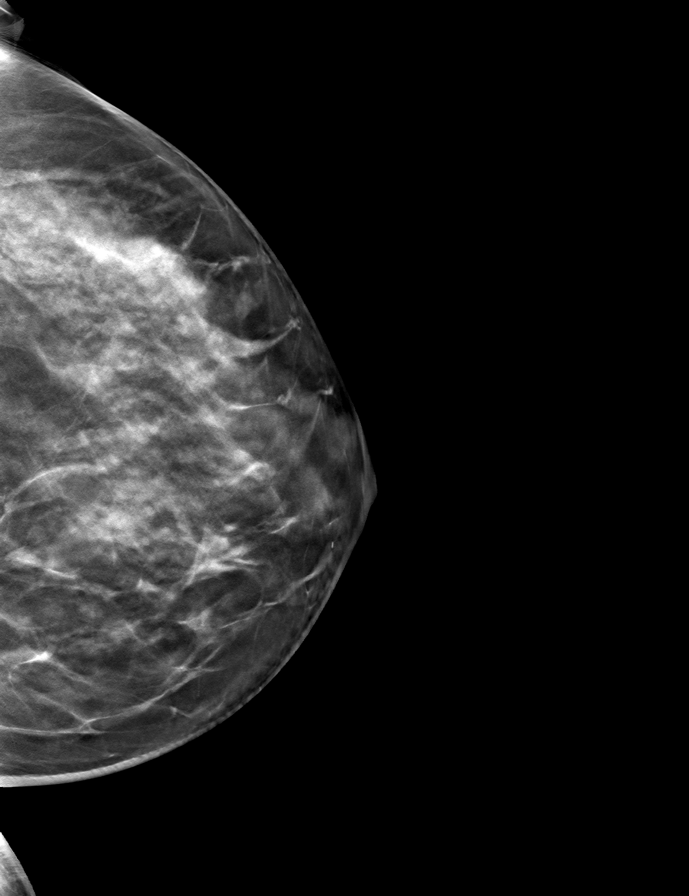

[R CC tomo · tomo slice 39/78.0]
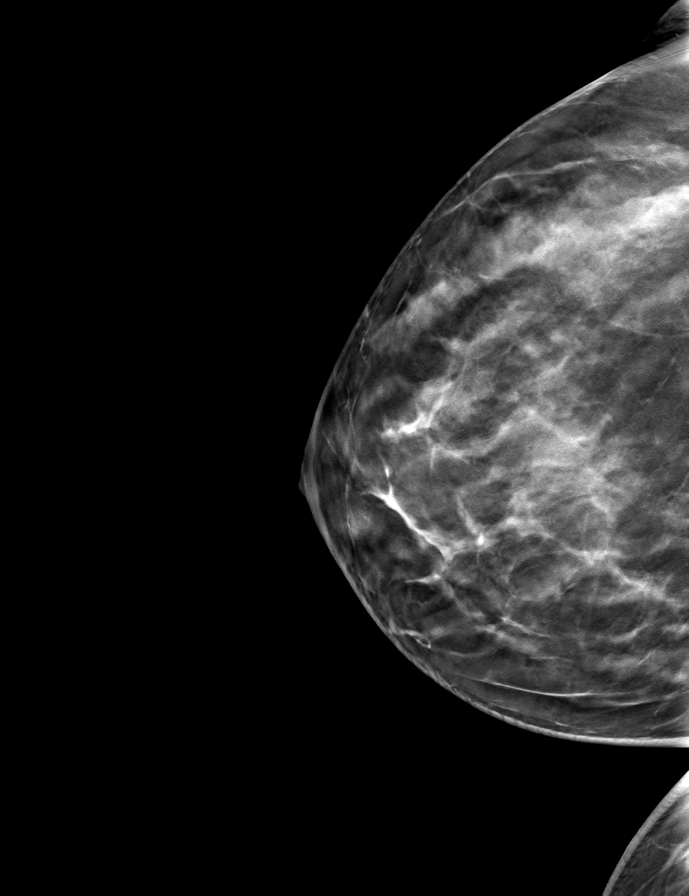

[L MLO tomo · tomo slice 39/77.0]
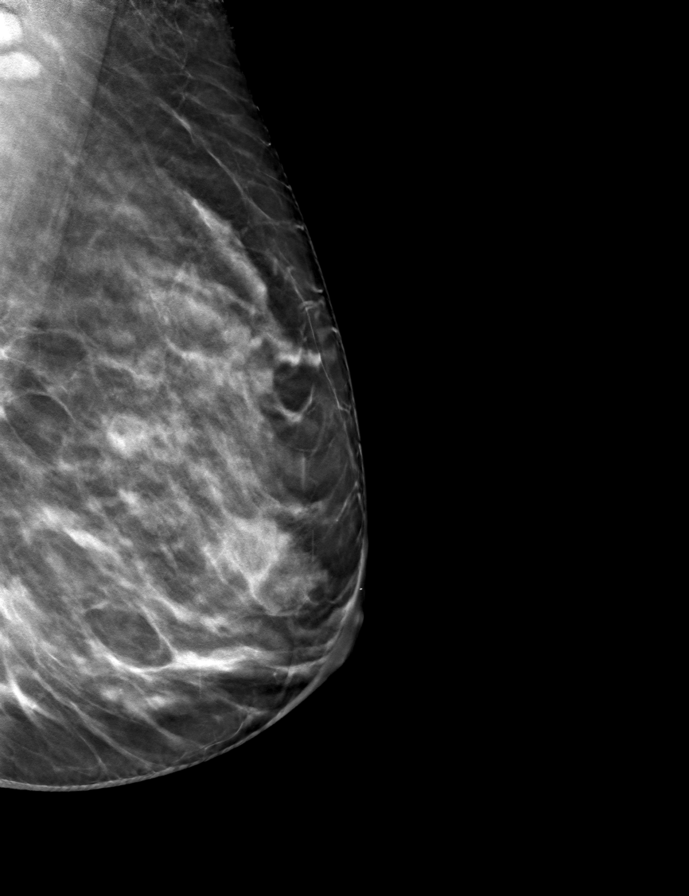

[R MLO tomo · tomo slice 35/69.0]
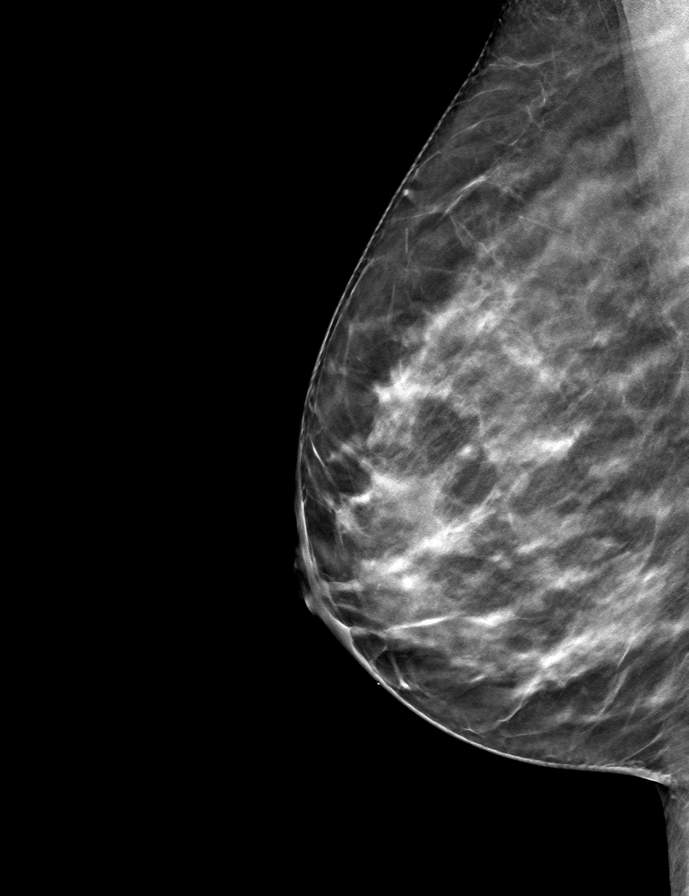

[9 of 24 positions shown; findings below may reference images not displayed]

ACR Breast Density Category c: The breast tissue is heterogeneously
dense, which may obscure small masses.
FINDINGS: In the left breast, a possible mass warrants further evaluation. In
the right breast, no findings suspicious for malignancy.

Images were processed with CAD.
IMPRESSION: Further evaluation is suggested for possible mass in the left
breast.

RECOMMENDATION:
Diagnostic mammogram and possibly ultrasound of the left breast.
(Code:[id number])

The patient will be contacted regarding the findings, and additional
imaging will be scheduled.

BI-RADS CATEGORY  0: Incomplete. Need additional imaging evaluation
and/or prior mammograms for comparison.

## 2021-01-14 ENCOUNTER — Other Ambulatory Visit: Payer: Self-pay | Admitting: Gastroenterology

## 2021-03-02 ENCOUNTER — Other Ambulatory Visit (HOSPITAL_COMMUNITY): Payer: Self-pay | Admitting: Psychiatry

## 2021-03-06 ENCOUNTER — Encounter (HOSPITAL_COMMUNITY): Payer: Self-pay | Admitting: Psychiatry

## 2021-03-06 ENCOUNTER — Telehealth (INDEPENDENT_AMBULATORY_CARE_PROVIDER_SITE_OTHER): Payer: BC Managed Care – PPO | Admitting: Psychiatry

## 2021-03-06 DIAGNOSIS — F429 Obsessive-compulsive disorder, unspecified: Secondary | ICD-10-CM

## 2021-03-06 DIAGNOSIS — Z9189 Other specified personal risk factors, not elsewhere classified: Secondary | ICD-10-CM | POA: Diagnosis not present

## 2021-03-06 DIAGNOSIS — F411 Generalized anxiety disorder: Secondary | ICD-10-CM

## 2021-03-06 NOTE — Progress Notes (Signed)
[facility] Outpatient Follow up visit   Patient Identification: Frances Ho MRN:  [id number] Date of Evaluation:  03/06/2021 Referral Source: Primary care Chief Complaint:    follow up depression, anxiety Visit Diagnosis:   GAD Insomnia  amotivation   Virtual Visit via Telephone Note  I connected with Frances Jack Selle on 03/06/21 at  1:30 PM EST by telephone and verified that I am speaking with the correct person using two identifiers.  Location: Patient: home Provider: home office   I discussed the limitations, risks, security and privacy concerns of performing an evaluation and management service by telephone and the availability of in person appointments. I also discussed with the patient that there may be a patient responsible charge related to this service. The patient expressed understanding and agreed to proceed.     I discussed the assessment and treatment plan with the patient. The patient was provided an opportunity to ask questions and all were answered. The patient agreed with the plan and demonstrated an understanding of the instructions.   The patient was advised to call back or seek an in-person evaluation if the symptoms worsen or if the condition fails to improve as anticipated.  I provided 15 minutes of non-face-to-face time during this encounter.      HPI:  51   years old currently single Caucasian female who is living with her mom. 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 Has to take care of mom Aggravating factor: finances,  llonliness Modifying factor: family, home no psychosis     Past Medical History:  Past Medical History:  Diagnosis Date   Abnormal Pap smear of cervix 2009   Anxiety    Chronic headaches    Depression    Diastolic dysfunction 0000000   Endometriosis    Genital warts    Heart murmur    Mitral valve prolapse    mild bileaflet MVP with mild MR on echo 12/2019   OCD (obsessive compulsive disorder)    OSA (obstructive sleep apnea) 07/30/2010   Tachycardia     Past Surgical History:  Procedure Laterality Date   COLPOSCOPY  3/09   w/ECC-neg   FOOT SURGERY  2005   left   PELVIC LAPAROSCOPY Bilateral 11/13/03   laparoscopic removal bilateral endometriomas    Family Psychiatric History: Mom : depression and anxiety  Family History:  Family History  Problem Relation Age of Onset   Skin cancer Mother    Irritable bowel syndrome Mother    Colon polyps Brother 32       colon polyps   Heart disease Maternal Grandmother    Heart disease Maternal Grandfather    Heart disease Paternal Grandmother    Diabetes Paternal Grandfather    Heart disease Paternal Grandfather    Diabetes Other        grandfather   Hypertension Other        grandfather/grandmother   Esophageal cancer Neg Hx    Pancreatic cancer Neg Hx    Stomach cancer Neg Hx     Social History:  Social History   Socioeconomic History   Marital status: Single    Spouse name: Not on file   Number of children: 0   Years of education: Not on file   Highest education level: Not on file  Occupational History   Occupation: clerical at [employer]  Tobacco Use   Smoking status: Never   Smokeless tobacco: Never  Vaping Use   Vaping Use: Never used  Substance and Sexual Activity   Alcohol use: No   Drug use: No   Sexual activity: Not Currently    Partners: Male    Birth control/protection: Pill    Comment: yaz  Other Topics Concern   Not on file  Social History Narrative   Pt is single and lives with  family.    Social Determinants of Health   Financial Resource Strain: Not on file  Food Insecurity: Not on file  Transportation Needs: Not on file  Physical Activity: Not on file  Stress: Not on file  Social Connections: Not on file     Allergies:  No Known Allergies  Metabolic Disorder Labs: Lab Results  Component Value Date   HGBA1C 5.1 05/01/2020   MPG 99.67 05/01/2020   No results found for: PROLACTIN Lab Results  Component Value Date   CHOL 230 (H) 05/01/2020   TRIG 314 (H) 05/01/2020   HDL 65 05/01/2020   CHOLHDL 3.5 05/01/2020   VLDL 63 (H) 05/01/2020   LDLCALC 102 (H) 05/01/2020   LDLCALC 144 (H) 01/31/2019     Current Medications: Current Outpatient Medications  Medication Sig Dispense Refill   B Complex Vitamins (B COMPLEX PO) Take 1 tablet by mouth daily.     BIOTIN PO Take 1,000 mg by mouth.     clonazePAM (KLONOPIN) 0.5 MG tablet Take 1 tablet (0.5 mg total) by mouth 2 (two) times daily as needed for anxiety. 30 tablet 0   DENTA 5000 PLUS 1.1 % CREA dental cream Take 1 application by mouth daily.     dicyclomine (BENTYL) 10 MG capsule TAKE 1 CAPSULE (10 MG TOTAL) BY MOUTH EVERY 8 (EIGHT) HOURS AS NEEDED FOR SPASMS. 270 capsule 1   drospirenone-ethinyl estradiol (NIKKI) 3-0.02 MG tablet Take 1 tablet by mouth daily. 84 tablet 4   Fluocinolone Acetonide Body 0.01 % OIL Apply 1 application topically 2 (two) times daily.     FLUoxetine (PROZAC) 20 MG tablet TAKE TWO TABLETS BY MOUTH DAILY 180 tablet 0   GLUCOSA-CHONDR-NA CHONDR-MSM PO Take 1,500 mg by mouth daily. (Patient not taking: Reported on 01/01/2021)     Magnesium 100 MG CAPS Take 100 mg by mouth 2 (two) times daily.     Methylsulfonylmethane (MSM) 1000 MG CAPS Take 1 capsule by mouth daily.     Multiple Vitamin (MULTIVITAMIN) tablet Take 1 tablet by mouth daily.     ondansetron (ZOFRAN) 4 MG tablet Take 1 tablet (4 mg total) by mouth every 8 (eight) hours as needed for nausea or vomiting. To use with  colonoscopy prep 6 tablet 0   No current facility-administered medications for this visit.      Psychiatric Specialty Exam: Review of Systems  Cardiovascular:  Negative for chest pain.  Psychiatric/Behavioral:  Negative for substance abuse and suicidal ideas.    There were no vitals taken for this visit.There is no height or weight on file to calculate BMI.  General Appearance:   Eye Contact:  Speech:  Normal Rate  Volume:  Decreased  Mood: somewhat subdued  Affect:  Thought Process:  Goal Directed  Orientation:  Full (Time, Place, and Person)  Thought Content:  Rumination  Suicidal Thoughts:  No  Homicidal Thoughts:  No  Memory:  Immediate;   Fair Recent;   Fair  Judgement:  Fair  Insight:  Fair  Psychomotor Activity:  Decreased  Concentration:  Concentration: Fair and Attention Span: Fair  Recall:  [employer] of Knowledge:Fair  Language: Fair  Akathisia:  Negative  Handed:  Right  AIMS (if indicated):    Assets:  Desire for Improvement  ADL's:  Intact  Cognition: WNL  Sleep:  poor    Treatment Plan Summary: Medication management and Plan as follows    Prior documentation reviewed   GAD and panic symptoms: relavant worries and finances, provided supportive therapy, may look into more jobs Continue prozac, klonopine   OCD: not worse, continue meds   Depression: subdued, consider therapy, work on distraction. Feels med need not increase. Continue prozac She is looking for a therapist outside  [place] [address]   Merian Capron, MD 2/8/20231:47 PM Patient ID: Frances Ho, female   DOB: 1970-07-18, 51 y.o.   MRN: [id number]

## 2021-03-13 ENCOUNTER — Ambulatory Visit: Payer: BC Managed Care – PPO | Admitting: Physician Assistant

## 2021-03-25 ENCOUNTER — Encounter (HOSPITAL_BASED_OUTPATIENT_CLINIC_OR_DEPARTMENT_OTHER): Payer: Self-pay | Admitting: Nurse Practitioner

## 2021-03-25 ENCOUNTER — Other Ambulatory Visit: Payer: Self-pay

## 2021-03-25 ENCOUNTER — Ambulatory Visit (HOSPITAL_BASED_OUTPATIENT_CLINIC_OR_DEPARTMENT_OTHER): Payer: BC Managed Care – PPO | Admitting: Nurse Practitioner

## 2021-03-25 VITALS — BP 104/60 | HR 91 | Ht 62.0 in | Wt 145.1 lb

## 2021-03-25 DIAGNOSIS — R0789 Other chest pain: Secondary | ICD-10-CM | POA: Diagnosis not present

## 2021-03-25 DIAGNOSIS — F419 Anxiety disorder, unspecified: Secondary | ICD-10-CM | POA: Diagnosis not present

## 2021-03-25 DIAGNOSIS — I341 Nonrheumatic mitral (valve) prolapse: Secondary | ICD-10-CM | POA: Diagnosis not present

## 2021-03-25 DIAGNOSIS — G4733 Obstructive sleep apnea (adult) (pediatric): Secondary | ICD-10-CM

## 2021-03-25 NOTE — Patient Instructions (Signed)
Medication Instructions:  Your Physician recommend you continue on your current medication as directed.    *If you need a refill on your cardiac medications before your next appointment, please call your pharmacy*  Testing/Procedures: Your physician has requested that you have an echocardiogram in December 2023 . Echocardiography is a painless test that uses sound waves to create images of your heart. It provides your doctor with information about the size and shape of your heart and how well your hearts chambers and valves are working. This procedure takes approximately one hour. There are no restrictions for this procedure. [address]    Follow-Up: At [facility], you and your health needs are our priority.  As part of our continuing mission to provide you with exceptional heart care, we have created designated Provider Care Teams.  These Care Teams include your primary Cardiologist (physician) and Advanced Practice Providers (APPs -  Physician Assistants and Nurse Practitioners) who all work together to provide you with the care you need, when you need it.  We recommend signing up for the patient portal called "MyChart".  Sign up information is provided on this After Visit Summary.  MyChart is used to connect with patients for Virtual Visits (Telemedicine).  Patients are able to view lab/test results, encounter notes, upcoming appointments, etc.  Non-urgent messages can be sent to your provider as well.   To learn more about what you can do with MyChart, go to [email or web address].    Your next appointment:   Follow up with Dr. Mayford Knife after ECHO in [place]

## 2021-03-25 NOTE — Progress Notes (Signed)
Office Visit    Patient Name: Frances Ho Date of Encounter: 03/25/2021  Primary Care Provider:  Patient, No Pcp Per (Inactive) Primary Cardiologist:  Armanda Magic, MD  Chief Complaint   51 year old female with a history of mitral valve prolapse, musculoskeletal chest pain, anxiety disorder with panic attacks, depression, OCD, and chronic headaches who presents for follow-up related to mitral valve prolapse.  Past Medical History    Past Medical History:  Diagnosis Date   Abnormal Pap smear of cervix 2009   Anxiety    Chronic headaches    Depression    Diastolic dysfunction 03/12/2016   Endometriosis    Genital warts    Heart murmur    Mitral valve prolapse    mild bileaflet MVP with mild MR on echo 12/2019   OCD (obsessive compulsive disorder)    OSA (obstructive sleep apnea) 07/30/2010   Tachycardia    Past Surgical History:  Procedure Laterality Date   COLPOSCOPY  3/09   w/ECC-neg   FOOT SURGERY  2005   left   PELVIC LAPAROSCOPY Bilateral 11/13/03   laparoscopic removal bilateral endometriomas    Allergies  No Known Allergies  History of Present Illness    51 year old female with the above past medical history including mitral valve prolapse, musculoskeletal chest pain, anxiety disorder with panic attacks, depression, OCD, and chronic headaches.  She is followed by Dr. Mayford Knife. 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  Echocardiogram in 2019 showed bileaflet mitral valve prolapse with trivial mitral valve regurgitation. Most recent echocardiogram in December 2021 showed bileaflet mitral valve prolapse with mitral annular disjunction, mild mitral valve regurgitation.  Additionally, she has a history of atypical chest pain, ETT in 2018 showed no ischemia, coronary CTA in 2020 showed calcium score of 0. Her chest pain was thought to be musculoskeletal as it was reproducible with palpation over the  sternum.  She was last seen in the office on 12/13/2019 and was stable from a cardiac standpoint.   She presents today for follow-up. Since her last visit he has been stable from a cardiac standpoint. Denies any symptoms concerning for angina, denies dyspnea, palpitations, presyncope or syncope.  She has a documented history of sleep apnea but did not tolerate CPAP in the past.  She is asking about any new technologies, including inspire.  She continues to experience daytime somnolence.  Otherwise, she reports feeling well denies any specific concerns or complaints.  Home Medications    Current Outpatient Medications  Medication Sig Dispense Refill   B Complex Vitamins (B COMPLEX PO) Take 1 tablet by mouth daily.     BIOTIN PO Take 1,000 mg by mouth.     clonazePAM (KLONOPIN) 0.5 MG tablet Take 1 tablet (0.5 mg total) by mouth 2 (two) times daily as needed for anxiety. 30 tablet 0   DENTA 5000 PLUS 1.1 % CREA dental cream Take 1 application by mouth daily.     drospirenone-ethinyl estradiol (NIKKI) 3-0.02 MG tablet Take 1 tablet by mouth daily. 84 tablet 4   finasteride (PROSCAR) 5 MG tablet Take 2.5 mg by mouth daily.     Fluocinolone Acetonide Body 0.01 % OIL Apply 1 application topically 2 (two) times daily.     FLUoxetine (PROZAC) 20 MG tablet TAKE TWO TABLETS BY MOUTH DAILY 180 tablet 0   GLUCOSA-CHONDR-NA CHONDR-MSM PO Take 1,500 mg by mouth daily.     Magnesium 100 MG CAPS  Take 100 mg by mouth 2 (two) times daily.     Methylsulfonylmethane (MSM) 1000 MG CAPS Take 1 capsule by mouth daily.     Multiple Vitamin (MULTIVITAMIN) tablet Take 1 tablet by mouth daily.     ondansetron (ZOFRAN) 4 MG tablet Take 1 tablet (4 mg total) by mouth every 8 (eight) hours as needed for nausea or vomiting. To use with colonoscopy prep 6 tablet 0   No current facility-administered medications for this visit.     Review of Systems    She denies chest pain, palpitations, dyspnea, pnd, orthopnea, n, v,  dizziness, syncope, edema, weight gain, or early satiety. All other systems reviewed and are otherwise negative except as noted above.   Physical Exam    VS:  BP 104/60 (BP Location: Left Arm, Patient Position: Sitting, Cuff Size: Normal)    Pulse 91    Ht 5\' 2"  (1.575 m)    Wt 145 lb 1.6 oz (65.8 kg)    SpO2 98%    BMI 26.54 kg/m  GEN: Well nourished, well developed, in no acute distress. HEENT: normal. Neck: Supple, no JVD, carotid bruits, or masses. Cardiac: RRR, no murmurs, rubs, or gallops. No clubbing, cyanosis, edema.  Radials/DP/PT 2+ and equal bilaterally.  Respiratory:  Respirations regular and unlabored, clear to auscultation bilaterally. GI: Soft, nontender, nondistended, BS + x 4. MS: no deformity or atrophy. Skin: warm and dry, no rash. Neuro:  Strength and sensation are intact. Psych: Normal affect.  Accessory Clinical Findings    ECG personally reviewed by me today - NRS, 91 bpm - no acute changes.  Lab Results  Component Value Date   WBC 5.7 01/31/2019   HGB 13.3 01/31/2019   HCT 38.5 01/31/2019   MCV 92 01/31/2019   PLT 244 01/31/2019   Lab Results  Component Value Date   CREATININE 0.85 01/31/2019   BUN 12 01/31/2019   NA 137 01/31/2019   K 3.8 01/31/2019   CL 101 01/31/2019   CO2 23 01/31/2019   Lab Results  Component Value Date   ALT 19 01/31/2019   AST 24 01/31/2019   ALKPHOS 88 01/31/2019   BILITOT 0.4 01/31/2019   Lab Results  Component Value Date   CHOL 230 (H) 05/01/2020   HDL 65 05/01/2020   LDLCALC 102 (H) 05/01/2020   TRIG 314 (H) 05/01/2020   CHOLHDL 3.5 05/01/2020    Lab Results  Component Value Date   HGBA1C 5.1 05/01/2020    Assessment & Plan    1. 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 Most recent echocardiogram in December 2021 showed bileaflet mitral valve prolapse with mitral annular disjunction, mild mitral valve regurgitation. Asymptomatic. Will schedule  routine repeat echocardiogram for December 2023.    2. Atypical chest pain: H/o noncardiac chest pain. ETT in 2018 showed no ischemia, coronary CTA in 2020 showed calcium score of 0. Stable with no anginal symptoms. No indication for ischemic evaluation.   3. Anxiety: Controlled, monitored and managed by PCP.  4. OSA: Last sleep study was in 2013. Did not tolerate CPAP.  She does report some daytime somnolence. She would like to wants to discuss any new treatments, including inspire with Dr. 2014.  I will forward my note to Dr. Mayford Knife with patient's inquiry.  4. Disposition: F/u 1 year.   Mayford Knife, NP 03/25/2021, 4:09 PM

## 2021-03-25 NOTE — Addendum Note (Signed)
Addended by: Marlene Lard on: 03/25/2021 04:21 PM   Modules accepted: Orders

## 2021-03-27 ENCOUNTER — Telehealth: Payer: Self-pay | Admitting: *Deleted

## 2021-03-27 DIAGNOSIS — R0683 Snoring: Secondary | ICD-10-CM

## 2021-03-27 DIAGNOSIS — G4733 Obstructive sleep apnea (adult) (pediatric): Secondary | ICD-10-CM

## 2021-03-27 NOTE — Telephone Encounter (Signed)
-----   Message from Lauralee Evener, [place] sent at 03/26/2021  3:58 PM EST ----- ? ?----- Message ----- ?From: Lenna Sciara, NP ?Sent: 03/26/2021  12:20 PM EST ?To: [facility] Sleep Studies ? ?Please order PSG to reassess OSA per Dr. Radford Pax to determine if she is candidate for inspire. Thank you! ?----- Message ----- ?From: Sueanne Margarita, MD ?Sent: 03/25/2021   5:44 PM EST ?To: Lenna Sciara, NP ? ?She would need a new PSG ordered to reassess for OSA - needs AHI>15/hr to qualify for Inspire ?----- Message ----- ?From: Lenna Sciara, NP ?Sent: 03/25/2021   4:15 PM EST ?To: Sueanne Margarita, MD ? ?Seen for annual (overdue) f/u. She is doing well, routine repeat echo scheduled for 12/2021. She did mention wanting to talk to you about any new treatment options (including inspire) for OSA. She has a history, last sleep study was in 2013, did not tolerate CPAP. Please let me know if you have any recommendations, or if you would like to have her evaluated in sleep clinic. Thank you! ? ? ? ?

## 2021-03-27 NOTE — Telephone Encounter (Signed)
NOT AUTHORIZED, SENT TO CLINICAL REVIEW ?

## 2021-04-08 DIAGNOSIS — L661 Lichen planopilaris: Secondary | ICD-10-CM | POA: Diagnosis not present

## 2021-04-08 DIAGNOSIS — L658 Other specified nonscarring hair loss: Secondary | ICD-10-CM | POA: Diagnosis not present

## 2021-04-11 ENCOUNTER — Telehealth (HOSPITAL_COMMUNITY): Payer: Self-pay | Admitting: Psychiatry

## 2021-04-11 NOTE — Telephone Encounter (Signed)
Returned the pt's call about appt. ?Left a vm  ?

## 2021-05-07 ENCOUNTER — Other Ambulatory Visit: Payer: Self-pay | Admitting: Obstetrics & Gynecology

## 2021-05-07 DIAGNOSIS — Z1231 Encounter for screening mammogram for malignant neoplasm of breast: Secondary | ICD-10-CM

## 2021-05-20 NOTE — Addendum Note (Signed)
Addended by: Reesa Chew on: 05/20/2021 11:22 AM ? ? Modules accepted: Orders ? ?

## 2021-05-20 NOTE — Telephone Encounter (Signed)
NON-AUTHORIZED- Order ID: [id number] ?WILL RESUBMIT FOR HST ?

## 2021-05-23 ENCOUNTER — Ambulatory Visit (HOSPITAL_BASED_OUTPATIENT_CLINIC_OR_DEPARTMENT_OTHER): Payer: BC Managed Care – PPO | Admitting: Obstetrics & Gynecology

## 2021-05-23 NOTE — Telephone Encounter (Signed)
Prior Authorization for HOME SLEEP TEST sent to [facility] via web portal.  ?4/27  order ID: [id number] ?VALID-05/23/21--07/21/21 ? ?Frances Ho DENIED ?4/25   Order ID: [id number] ?In Progress ?Anticipated Determination Date:05/23/2021.  ?

## 2021-05-23 NOTE — Telephone Encounter (Signed)
Insurance approved a home test. ?

## 2021-05-23 NOTE — Addendum Note (Signed)
Addended by: Freada Bergeron on: 05/23/2021 11:39 AM ? ? Modules accepted: Orders ? ?

## 2021-05-24 ENCOUNTER — Ambulatory Visit (HOSPITAL_BASED_OUTPATIENT_CLINIC_OR_DEPARTMENT_OTHER): Payer: BC Managed Care – PPO | Admitting: Obstetrics & Gynecology

## 2021-06-03 ENCOUNTER — Encounter (HOSPITAL_COMMUNITY): Payer: Self-pay | Admitting: Psychiatry

## 2021-06-03 ENCOUNTER — Telehealth (INDEPENDENT_AMBULATORY_CARE_PROVIDER_SITE_OTHER): Payer: BC Managed Care – PPO | Admitting: Psychiatry

## 2021-06-03 ENCOUNTER — Other Ambulatory Visit (HOSPITAL_COMMUNITY): Payer: Self-pay | Admitting: Psychiatry

## 2021-06-03 DIAGNOSIS — F411 Generalized anxiety disorder: Secondary | ICD-10-CM

## 2021-06-03 DIAGNOSIS — F429 Obsessive-compulsive disorder, unspecified: Secondary | ICD-10-CM | POA: Diagnosis not present

## 2021-06-03 DIAGNOSIS — Z9189 Other specified personal risk factors, not elsewhere classified: Secondary | ICD-10-CM | POA: Diagnosis not present

## 2021-06-03 NOTE — Progress Notes (Signed)
[facility] Outpatient Follow up visit  ? ?Patient Identification: Frances Ho ?MRN:  [id number] ?Date of Evaluation:  06/03/2021 ?Referral Source: Primary care ?Chief Complaint:   ? follow up depression, anxiety ?Visit Diagnosis:  ? ?GAD ?Insomnia ? ?amotivation ?Virtual Visit via Telephone Note ? ?I connected with Frances Ho on 06/03/21 at  4:00 PM EDT by telephone and verified that I am speaking with the correct person using two identifiers. ? ?Location: ?Patient: home ?Provider: home office ?  ?I discussed the limitations, risks, security and privacy concerns of performing an evaluation and management service by telephone and the availability of in person appointments. I also discussed with the patient that there may be a patient responsible charge related to this service. The patient expressed understanding and agreed to proceed. ? ? ? ?  ?I discussed the assessment and treatment plan with the patient. The patient was provided an opportunity to ask questions and all were answered. The patient agreed with the plan and demonstrated an understanding of the instructions. ?  ?The patient was advised to call back or seek an in-person evaluation if the symptoms worsen or if the condition fails to improve as anticipated. ? ?I provided 15 minutes of non-face-to-face time during this encounter. ? ? ? ? ? ?HPI: ? ?51   years old currently single Caucasian female who is living with her mom. Referred initially by primary care physician for management of sleep, anxiety  and OCD ? ?Last visit suggested to add activities,  ?She is struggling do deal with dad being sick, his will and mom being at home ?Gets stressed,  ?Looing for a new residence ? ?Klonopine helps with anxiety attacks ? ? ?She procrastinates and has difficulty ?Prozac helps some ?Aggravating factor: finances, llonliness ?Modifying factor: family ?no psychosis ? ? ? ? ?Past Medical History:  ?Past Medical History:  ?Diagnosis Date  ? Abnormal Pap smear of  cervix 2009  ? Anxiety   ? Chronic headaches   ? Depression   ? Diastolic dysfunction 03/12/2016  ? Endometriosis   ? Genital warts   ? Heart murmur   ? Mitral valve prolapse   ? mild bileaflet MVP with mild MR on echo 12/2019  ? OCD (obsessive compulsive disorder)   ? OSA (obstructive sleep apnea) 07/30/2010  ? Tachycardia   ?  ?Past Surgical History:  ?Procedure Laterality Date  ? COLPOSCOPY  3/09  ? w/ECC-neg  ? FOOT SURGERY  2005  ? left  ? PELVIC LAPAROSCOPY Bilateral 11/13/03  ? laparoscopic removal bilateral endometriomas  ? ? ?Family Psychiatric History: Mom : depression and anxiety ? ?Family History:  ?Family History  ?Problem Relation Age of Onset  ? Skin cancer Mother   ? Irritable bowel syndrome Mother   ? Colon polyps Brother 42  ?     colon polyps  ? Heart disease Maternal Grandmother   ? Heart disease Maternal Grandfather   ? Heart disease Paternal Grandmother   ? Diabetes Paternal Grandfather   ? Heart disease Paternal Grandfather   ? Diabetes Other   ?     grandfather  ? Hypertension Other   ?     grandfather/grandmother  ? Esophageal cancer Neg Hx   ? Pancreatic cancer Neg Hx   ? Stomach cancer Neg Hx   ? ? ?Social History:   ?Social History  ? ?Socioeconomic History  ? Marital status: Single  ?  Spouse name: Not on file  ? Number of children: 0  ? Years  of education: Not on file  ? Highest education level: Not on file  ?Occupational History  ? Occupation: clerical at [employer]  ?Tobacco Use  ? Smoking status: Never  ? Smokeless tobacco: Never  ?Vaping Use  ? Vaping Use: Never used  ?Substance and Sexual Activity  ? Alcohol use: No  ? Drug use: No  ? Sexual activity: Not Currently  ?  Partners: Male  ?  Birth control/protection: Pill  ?  Comment: yaz  ?Other Topics Concern  ? Not on file  ?Social History Narrative  ? Pt is single and lives with family.   ? ?Social Determinants of Health  ? ?Financial Resource Strain: Not on file  ?Food Insecurity: Not on file  ?Transportation Needs: Not on file   ?Physical Activity: Not on file  ?Stress: Not on file  ?Social Connections: Not on file  ? ? ? ?Allergies:  No Known Allergies ? ?Metabolic Disorder Labs: ?Lab Results  ?Component Value Date  ? HGBA1C 5.1 05/01/2020  ? MPG 99.67 05/01/2020  ? ?No results found for: PROLACTIN ?Lab Results  ?Component Value Date  ? CHOL 230 (H) 05/01/2020  ? TRIG 314 (H) 05/01/2020  ? HDL 65 05/01/2020  ? CHOLHDL 3.5 05/01/2020  ? VLDL 63 (H) 05/01/2020  ? LDLCALC 102 (H) 05/01/2020  ? LDLCALC 144 (H) 01/31/2019  ? ? ? ?Current Medications: ?Current Outpatient Medications  ?Medication Sig Dispense Refill  ? B Complex Vitamins (B COMPLEX PO) Take 1 tablet by mouth daily.    ? BIOTIN PO Take 1,000 mg by mouth.    ? clonazePAM (KLONOPIN) 0.5 MG tablet Take 1 tablet (0.5 mg total) by mouth 2 (two) times daily as needed for anxiety. 30 tablet 0  ? DENTA 5000 PLUS 1.1 % CREA dental cream Take 1 application by mouth daily.    ? drospirenone-ethinyl estradiol (NIKKI) 3-0.02 MG tablet Take 1 tablet by mouth daily. 84 tablet 4  ? finasteride (PROSCAR) 5 MG tablet Take 2.5 mg by mouth daily.    ? Fluocinolone Acetonide Body 0.01 % OIL Apply 1 application topically 2 (two) times daily.    ? FLUoxetine (PROZAC) 20 MG tablet TAKE TWO TABLETS BY MOUTH DAILY 180 tablet 0  ? GLUCOSA-CHONDR-NA CHONDR-MSM PO Take 1,500 mg by mouth daily.    ? Magnesium 100 MG CAPS Take 100 mg by mouth 2 (two) times daily.    ? Methylsulfonylmethane (MSM) 1000 MG CAPS Take 1 capsule by mouth daily.    ? Multiple Vitamin (MULTIVITAMIN) tablet Take 1 tablet by mouth daily.    ? ondansetron (ZOFRAN) 4 MG tablet Take 1 tablet (4 mg total) by mouth every 8 (eight) hours as needed for nausea or vomiting. To use with colonoscopy prep 6 tablet 0  ? ?No current facility-administered medications for this visit.  ? ? ? ? ?Psychiatric Specialty Exam: ?Review of Systems  ?Cardiovascular:  Negative for chest pain.  ?Psychiatric/Behavioral:  Negative for substance abuse and suicidal  ideas.    ?There were no vitals taken for this visit.There is no height or weight on file to calculate BMI.  ?General Appearance:   ?Eye Contact:  ?Speech:  Normal Rate  ?Volume:  Decreased  ?Mood: somewhat subdued  ?Affect:    ?Thought Process:  Goal Directed  ?Orientation:  Full (Time, Place, and Person)  ?Thought Content:  Rumination  ?Suicidal Thoughts:  No  ?Homicidal Thoughts:  No  ?Memory:  Immediate;   Fair ?Recent;   Fair  ?Judgement:  Fair  ?Insight:  Fair  ?Psychomotor Activity:  Decreased  ?Concentration:  Concentration: Fair and Attention Span: Fair  ?Recall:  Fair  ?Fund of Knowledge:Fair  ?Language: Fair  ?Akathisia:  Negative  ?Handed:  Right  ?AIMS (if indicated):    ?Assets:  Desire for Improvement  ?ADL's:  Intact  ?Cognition: WNL  ?Sleep:  poor  ? ? ?Treatment Plan Summary: ?Medication management and Plan as follows   ?Prior documentation reviewed ? ? ? ?GAD and panic symptoms: relavant worries, provided supportive therapy,  ?Prozac is already 40mg  she understands risk of side effects on high dose ?Wil continue 40mg  and klonopine prn ?Work on distractions  ? ? ?OCD: basleine continue meds ? ? ?Depression: somewhat subdued see above, continue prozac ?Prefer to see her in office next time considering tele appointment and not able to do virtual ?Fu [id number] ? ? ?Thresa RossNadeem Hani Campusano, MD ?5/8/20234:13 PM ? ? ?

## 2021-06-05 DIAGNOSIS — L661 Lichen planopilaris: Secondary | ICD-10-CM | POA: Diagnosis not present

## 2021-06-05 DIAGNOSIS — L570 Actinic keratosis: Secondary | ICD-10-CM | POA: Diagnosis not present

## 2021-06-05 DIAGNOSIS — D224 Melanocytic nevi of scalp and neck: Secondary | ICD-10-CM | POA: Diagnosis not present

## 2021-06-05 DIAGNOSIS — L821 Other seborrheic keratosis: Secondary | ICD-10-CM | POA: Diagnosis not present

## 2021-06-10 ENCOUNTER — Ambulatory Visit (HOSPITAL_BASED_OUTPATIENT_CLINIC_OR_DEPARTMENT_OTHER): Payer: BC Managed Care – PPO | Admitting: Obstetrics & Gynecology

## 2021-06-11 ENCOUNTER — Ambulatory Visit (HOSPITAL_COMMUNITY): Payer: BC Managed Care – PPO | Admitting: Psychiatry

## 2021-06-13 ENCOUNTER — Ambulatory Visit
Admission: RE | Admit: 2021-06-13 | Discharge: 2021-06-13 | Disposition: A | Discharge status: Home / Self Care | Payer: BC Managed Care – PPO | Source: Ambulatory Visit | Attending: Obstetrics & Gynecology | Admitting: Obstetrics & Gynecology

## 2021-06-13 ENCOUNTER — Ambulatory Visit: Payer: BC Managed Care – PPO

## 2021-06-13 DIAGNOSIS — Z1231 Encounter for screening mammogram for malignant neoplasm of breast: Secondary | ICD-10-CM

## 2021-07-12 ENCOUNTER — Other Ambulatory Visit (HOSPITAL_BASED_OUTPATIENT_CLINIC_OR_DEPARTMENT_OTHER): Payer: Self-pay

## 2021-07-12 DIAGNOSIS — N809 Endometriosis, unspecified: Secondary | ICD-10-CM

## 2021-07-12 MED ORDER — DROSPIRENONE-ETHINYL ESTRADIOL 3-0.02 MG PO TABS: 1.0000 | ORAL_TABLET | Freq: Every day | ORAL | 0 refills | Status: DC

## 2021-07-17 ENCOUNTER — Ambulatory Visit (HOSPITAL_BASED_OUTPATIENT_CLINIC_OR_DEPARTMENT_OTHER): Payer: BC Managed Care – PPO | Attending: Cardiology | Admitting: Cardiology

## 2021-07-17 DIAGNOSIS — R0683 Snoring: Secondary | ICD-10-CM | POA: Diagnosis not present

## 2021-07-17 DIAGNOSIS — G4733 Obstructive sleep apnea (adult) (pediatric): Secondary | ICD-10-CM | POA: Diagnosis not present

## 2021-07-18 NOTE — Procedures (Signed)
   Patient Name: Frances Ho Date:07/17/2021 Gender: Female D.O.B: 08/22/1946 Age (years): 11 Referring Provider: Verdis Prime, MD Height (inches): 66 Interpreting Physician: Armanda Magic MD, ABSM Weight (lbs): 225 RPSGT: Ulyess Mort BMI: 36 MRN: [id number] Neck Size: 16.00  CLINICAL INFORMATION Sleep Study Type: NPSG  Indication for sleep study: Daytime Fatigue, Fatigue, Hypertension, Obesity  Epworth Sleepiness Score: 9  SLEEP STUDY TECHNIQUE As per the AASM Manual for the Scoring of Sleep and Associated Events v2.3 (April 2016) with a hypopnea requiring 4% desaturations.  The channels recorded and monitored were frontal, central and occipital EEG, electrooculogram (EOG), submentalis EMG (chin), nasal and oral airflow, thoracic and abdominal wall motion, anterior tibialis EMG, snore microphone, electrocardiogram, and pulse oximetry.  MEDICATIONS Medications self-administered by patient taken the night of the study : sleep 3  SLEEP ARCHITECTURE The study was initiated at 11:02:17 PM and ended at 5:03:47 AM.  Sleep onset time was 62.0 minutes and the sleep efficiency was 24.9%. The total sleep time was 90 minutes.  Stage REM latency was 199.0 minutes.  The patient spent 79.4% of the night in stage N1 sleep, 18.9% in stage N2 sleep, 0.6% in stage N3 and 1.1% in REM.  Alpha intrusion was absent.  Supine sleep was 36.67%.  RESPIRATORY PARAMETERS The overall apnea/hypopnea index (AHI) was 18.0 per hour. There were 20 total apneas, including 16 obstructive, 2 central and 2 mixed apneas. There were 7 hypopneas and 71 RERAs.  The AHI during Stage REM sleep was 0.0 per hour.  AHI while supine was 41.8 per hour.  The mean oxygen saturation was 97.8%. The minimum SpO2 during sleep was 94.0%.  moderate snoring was noted during this study.  CARDIAC DATA The 2 lead EKG demonstrated atrial fibrillation. The mean heart rate was 68.1 beats per minute.  LEG MOVEMENT  DATA The total PLMS were 0 with a resulting PLMS index of 0.0. Associated arousal with leg movement index was 0.0 .  IMPRESSIONS - Moderate obstructive sleep apnea occurred during this study (AHI = 18.0/h). - No significant central sleep apnea occurred during this study (CAI = 1.3/h). - The patient had minimal or no oxygen desaturation during the study (Min O2 = 94.0%) - The patient snored with moderate snoring volume. - EKG findings include Atrial Fibrillation. - Clinically significant periodic limb movements did not occur during sleep. No significant associated arousals.  DIAGNOSIS - Obstructive Sleep Apnea (G47.33) - Atrial Fibrillation  RECOMMENDATIONS - Therapeutic CPAP titration to determine optimal pressure required to alleviate sleep disordered breathing. - Positional therapy avoiding supine position during sleep. - Avoid alcohol, sedatives and other CNS depressants that may worsen sleep apnea and disrupt normal sleep architecture. - Sleep hygiene should be reviewed to assess factors that may improve sleep quality. - Weight management and regular exercise should be initiated or continued if appropriate.  [Electronically signed] 07/18/2021 01:47 PM  Armanda Magic MD, ABSM Diplomate, American Board of Sleep Medicine

## 2021-07-19 NOTE — Procedures (Signed)
       Patient Name: Frances Ho, Frances Ho Date: 07/18/2021 Gender: Female D.O.B: 06/17/1970 Age (years): 51 Referring Provider: Armanda Magic MD, ABSM Height (inches): 62 Interpreting Physician: Armanda Magic MD, ABSM Weight (lbs): 145 RPSGT: [place] Sink BMI: 27 MRN: [id number] Neck Size: 13.75   CLINICAL INFORMATION Sleep Study Type: HST   Indication for sleep study: N/A   Epworth Sleepiness Score: N/A   SLEEP STUDY TECHNIQUE A multi-channel overnight portable sleep study was performed. The channels recorded were: nasal airflow, thoracic respiratory movement, and oxygen saturation with a pulse oximetry. Snoring was also monitored.   MEDICATIONS Patient self administered medications include: N/A.   SLEEP ARCHITECTURE Patient was studied for 364.5 minutes. The sleep efficiency was 100.0 % and the patient was supine for 0%. The arousal index was 0.0 per hour.   RESPIRATORY PARAMETERS The overall AHI was 1.8 per hour, with a central apnea index of 0 per hour.   The oxygen nadir was 92% during sleep.   CARDIAC DATA Mean heart rate during sleep was 70.3 bpm.   IMPRESSIONS - No significant obstructive sleep apnea occurred during this study (AHI = 1.8/h). - The patient had minimal or no oxygen desaturation during the study (Min O2 = 92%) - Patient snored 2.8% during the sleep.   DIAGNOSIS - Normal study   RECOMMENDATIONS - Avoid alcohol, sedatives and other CNS depressants that may worsen sleep apnea and disrupt normal sleep architecture. - Sleep hygiene should be reviewed to assess factors that may improve sleep quality. - Weight management and regular exercise should be initiated or continued.   [Electronically signed] 07/19/2021 01:12 PM   Armanda Magic MD, ABSM Diplomate, American Board of Sleep Medicine

## 2021-07-24 ENCOUNTER — Telehealth: Payer: Self-pay | Admitting: *Deleted

## 2021-07-24 DIAGNOSIS — R0683 Snoring: Secondary | ICD-10-CM

## 2021-07-24 DIAGNOSIS — G4710 Hypersomnia, unspecified: Secondary | ICD-10-CM

## 2021-07-24 DIAGNOSIS — F410 Panic disorder [episodic paroxysmal anxiety] without agoraphobia: Secondary | ICD-10-CM

## 2021-07-24 DIAGNOSIS — G4733 Obstructive sleep apnea (adult) (pediatric): Secondary | ICD-10-CM

## 2021-07-24 NOTE — Telephone Encounter (Signed)
The patient has been notified of the result and verbalized understanding.  All questions (if any) were answered. Latrelle Dodrill, CMA 07/24/2021 4:45 PM   Patient states she is still sleepy after sleeping all night and feels like she has not slept much at all. She has a hard time waking up. She states she tested negative for narcolepsy about 7 years ago and she went to see a pulmonologist and that was all negative. Patient would like know what she can do next .

## 2021-07-24 NOTE — Telephone Encounter (Signed)
-----   Message from Gaynelle Cage, CMA sent at 07/22/2021  8:36 AM EDT -----  ----- Message ----- From: Quintella Reichert, MD Sent: 07/19/2021   1:20 PM EDT To: Cv [facility] Sleep Studies   Please let patient know that sleep study showed no significant sleep apnea.

## 2021-07-25 ENCOUNTER — Other Ambulatory Visit (HOSPITAL_COMMUNITY)
Admission: RE | Admit: 2021-07-25 | Discharge: 2021-07-25 | Disposition: A | Discharge status: Home / Self Care | Payer: BC Managed Care – PPO | Source: Ambulatory Visit | Attending: Obstetrics & Gynecology | Admitting: Obstetrics & Gynecology

## 2021-07-25 ENCOUNTER — Encounter (HOSPITAL_BASED_OUTPATIENT_CLINIC_OR_DEPARTMENT_OTHER): Payer: Self-pay | Admitting: Obstetrics & Gynecology

## 2021-07-25 ENCOUNTER — Ambulatory Visit (INDEPENDENT_AMBULATORY_CARE_PROVIDER_SITE_OTHER): Payer: BC Managed Care – PPO | Admitting: Obstetrics & Gynecology

## 2021-07-25 VITALS — BP 109/73 | HR 76 | Ht 62.0 in | Wt 145.4 lb

## 2021-07-25 DIAGNOSIS — Z01419 Encounter for gynecological examination (general) (routine) without abnormal findings: Secondary | ICD-10-CM | POA: Diagnosis not present

## 2021-07-25 DIAGNOSIS — Z0000 Encounter for general adult medical examination without abnormal findings: Secondary | ICD-10-CM

## 2021-07-25 DIAGNOSIS — Z124 Encounter for screening for malignant neoplasm of cervix: Secondary | ICD-10-CM | POA: Diagnosis not present

## 2021-07-25 DIAGNOSIS — N809 Endometriosis, unspecified: Secondary | ICD-10-CM | POA: Diagnosis not present

## 2021-07-25 MED ORDER — DROSPIRENONE-ETHINYL ESTRADIOL 3-0.02 MG PO TABS: 1.0000 | ORAL_TABLET | Freq: Every day | ORAL | 3 refills | Status: DC

## 2021-07-25 NOTE — Progress Notes (Signed)
51 y.o. G0P0 Single Other or two or more races female here for annual exam.  Still having menstrual cycles.  Last one was last week.  Flow lasts about 3 days.    Would like blood work today.       Sexually active: No.  The current method of family planning is OCP (estrogen/progesterone).    Exercising: not currently Smoker:  no  Health Maintenance: Pap:  05/01/2020 Negative History of abnormal Pap:  yes, 2009 MMG:  06/13/2021 Negative Colonoscopy:  01/01/2021, follow up 10 years Screening Labs: obtained today   reports that she has never smoked. She has never used smokeless tobacco. She reports that she does not drink alcohol and does not use drugs.  Past Medical History:  Diagnosis Date   Abnormal Pap smear of cervix 2009   Anxiety    Chronic headaches    Depression    Diastolic dysfunction 03/12/2016   Endometriosis    Genital warts    Heart murmur    Mitral valve prolapse    mild bileaflet MVP with mild MR on echo 12/2019   OCD (obsessive compulsive disorder)    OSA (obstructive sleep apnea) 07/30/2010   Tachycardia     Past Surgical History:  Procedure Laterality Date   COLPOSCOPY  3/09   w/ECC-neg   FOOT SURGERY  2005   left   PELVIC LAPAROSCOPY Bilateral 11/13/03   laparoscopic removal bilateral endometriomas    Current Outpatient Medications  Medication Sig Dispense Refill   B Complex Vitamins (B COMPLEX PO) Take 1 tablet by mouth daily.     BIOTIN PO Take 1,000 mg by mouth.     clonazePAM (KLONOPIN) 0.5 MG tablet Take 1 tablet (0.5 mg total) by mouth 2 (two) times daily as needed for anxiety. 30 tablet 0   DENTA 5000 PLUS 1.1 % CREA dental cream Take 1 application by mouth daily.     finasteride (PROSCAR) 5 MG tablet Take 2.5 mg by mouth daily.     Fluocinolone Acetonide Body 0.01 % OIL Apply 1 application topically 2 (two) times daily.     FLUoxetine (PROZAC) 20 MG tablet TAKE TWO TABLETS BY MOUTH DAILY 180 tablet 0   GLUCOSA-CHONDR-NA CHONDR-MSM PO Take  1,500 mg by mouth daily.     Magnesium 100 MG CAPS Take 100 mg by mouth 2 (two) times daily.     Methylsulfonylmethane (MSM) 1000 MG CAPS Take 1 capsule by mouth daily.     Multiple Vitamin (MULTIVITAMIN) tablet Take 1 tablet by mouth daily.     drospirenone-ethinyl estradiol (NIKKI) 3-0.02 MG tablet Take 1 tablet by mouth daily. 84 tablet 3   No current facility-administered medications for this visit.    Family History  Problem Relation Age of Onset   Skin cancer Mother    Irritable bowel syndrome Mother    Colon polyps Brother 61       colon polyps   Heart disease Maternal Grandmother    Heart disease Maternal Grandfather    Heart disease Paternal Grandmother    Diabetes Paternal Grandfather    Heart disease Paternal Grandfather    Diabetes Other        grandfather   Hypertension Other        grandfather/grandmother   Esophageal cancer Neg Hx    Pancreatic cancer Neg Hx    Stomach cancer Neg Hx    ROS: Genitourinary:negative  Exam:   BP 109/73 (BP Location: Right Arm, Patient Position: Sitting, Cuff Size: Normal)  Pulse 76   Ht 5\' 2"  (1.575 m) Comment: reported  Wt 145 lb 6.4 oz (66 kg)   LMP 07/18/2021   BMI 26.59 kg/m   Height: 5\' 2"  (157.5 cm) (reported)  General appearance: alert, cooperative and appears stated age Head: Normocephalic, without obvious abnormality, atraumatic Neck: no adenopathy, supple, symmetrical, trachea midline and thyroid normal to inspection and palpation Lungs: clear to auscultation bilaterally Breasts: normal appearance, no masses or tenderness Heart: regular rate and rhythm Abdomen: soft, non-tender; bowel sounds normal; no masses,  no organomegaly Extremities: extremities normal, atraumatic, no cyanosis or edema Skin: Skin color, texture, turgor normal. No rashes or lesions Lymph nodes: Cervical, supraclavicular, and axillary nodes normal. No abnormal inguinal nodes palpated Neurologic: Grossly normal   Pelvic: External  genitalia:  no lesions              Urethra:  normal appearing urethra with no masses, tenderness or lesions              Bartholins and Skenes: normal                 Vagina: normal appearing vagina with normal color and no discharge, no lesions              Cervix: no lesions              Pap taken: Yes.   Bimanual Exam:  Uterus:  normal size, contour, position, consistency, mobility, non-tender              Adnexa: normal adnexa and no mass, fullness, tenderness               Rectovaginal: Confirms               Anus:  normal sphincter tone, no lesions  Chaperone, 07/20/2021, CMA, was present for exam.  Assessment/Plan: 1. Well woman exam with routine gynecological exam - pap obtained today.  Pt desires this today.  Discussed guidelines.  Had neg pap and HR HPV today - MMG up to date - colonoscopy 2022 - Vaccines reviewed.  Shingrix discussed. - lab work ordered today  2. Endometriosis - drospirenone-ethinyl estradiol (NIKKI) 3-0.02 MG tablet; Take 1 tablet by mouth daily.  Dispense: 84 tablet; Refill: 3  3. Blood tests for routine general physical examination - CBC - Comprehensive metabolic panel - Hemoglobin A1c - TSH - Lipid panel  4. Cervical cancer screening - Cytology - PAP( [facility])

## 2021-07-25 NOTE — Telephone Encounter (Signed)
Per Dr Mayford Knife, Set up for in lab PSG and then the next day to follow do an MSLT

## 2021-07-25 NOTE — Telephone Encounter (Signed)
Orders are in for the precert.

## 2021-07-26 LAB — COMPREHENSIVE METABOLIC PANEL
ALT: 19 IU/L (ref 0–32)
AST: 23 IU/L (ref 0–40)
Albumin/Globulin Ratio: 1.5 (ref 1.2–2.2)
Albumin: 4.3 g/dL (ref 3.8–4.8)
Alkaline Phosphatase: 103 IU/L (ref 44–121)
BUN/Creatinine Ratio: 13 (ref 9–23)
BUN: 12 mg/dL (ref 6–24)
Bilirubin Total: 0.4 mg/dL (ref 0.0–1.2)
CO2: 23 mmol/L (ref 20–29)
Calcium: 8.7 mg/dL (ref 8.7–10.2)
Chloride: 100 mmol/L (ref 96–106)
Creatinine, Ser: 0.94 mg/dL (ref 0.57–1.00)
Globulin, Total: 2.8 g/dL (ref 1.5–4.5)
Glucose: 79 mg/dL (ref 70–99)
Potassium: 3.9 mmol/L (ref 3.5–5.2)
Sodium: 137 mmol/L (ref 134–144)
Total Protein: 7.1 g/dL (ref 6.0–8.5)
eGFR: 74 mL/min/{1.73_m2} (ref >59)

## 2021-07-26 LAB — LIPID PANEL
Chol/HDL Ratio: 3.6 ratio (ref 0.0–4.4)
Cholesterol, Total: 234 mg/dL — ABNORMAL HIGH (ref 100–199)
HDL: 65 mg/dL (ref >39)
LDL Chol Calc (NIH): 138 mg/dL — ABNORMAL HIGH (ref 0–99)
Triglycerides: 176 mg/dL — ABNORMAL HIGH (ref 0–149)
VLDL Cholesterol Cal: 31 mg/dL (ref 5–40)

## 2021-07-26 LAB — CBC
Hematocrit: 40.3 % (ref 34.0–46.6)
Hemoglobin: 13.3 g/dL (ref 11.1–15.9)
MCH: 30.7 pg (ref 26.6–33.0)
MCHC: 33 g/dL (ref 31.5–35.7)
MCV: 93 fL (ref 79–97)
Platelets: 284 10*3/uL (ref 150–450)
RBC: 4.33 x10E6/uL (ref 3.77–5.28)
RDW: 12.3 % (ref 11.7–15.4)
WBC: 5.7 10*3/uL (ref 3.4–10.8)

## 2021-07-26 LAB — TSH: TSH: 2.07 u[IU]/mL (ref 0.450–4.500)

## 2021-07-26 LAB — CYTOLOGY - PAP: Diagnosis: NEGATIVE

## 2021-07-26 LAB — HEMOGLOBIN A1C
Est. average glucose Bld gHb Est-mCnc: 103 mg/dL
Hgb A1c MFr Bld: 5.2 % (ref 4.8–5.6)

## 2021-07-26 NOTE — Telephone Encounter (Signed)
Patient says her insurance will not pay for another test because she has not lost weight.

## 2021-08-02 NOTE — Addendum Note (Signed)
Addended by: Reesa Chew on: 08/02/2021 11:35 AM   Modules accepted: Orders

## 2021-08-02 NOTE — Telephone Encounter (Signed)
Prior Authorization for NPSG sent to [facility] via web portal. APPROVED-[employer]- AUTH# [id number] 08-02-21--09-30-21

## 2021-08-12 NOTE — Telephone Encounter (Addendum)
Patient called in to ask if there is medication for the hypersomnia. She does not want a cpap but she is very sleepy. Patient states she has ADHD and takes medicine for that. she is on Prozac for OCD.

## 2021-08-13 NOTE — Telephone Encounter (Signed)
The therapy is to treat OSA and then if still sleepy can do MSLT to dx hypersomnia or narcolepsy -

## 2021-08-15 ENCOUNTER — Encounter: Payer: Self-pay | Admitting: *Deleted

## 2021-09-01 ENCOUNTER — Other Ambulatory Visit (HOSPITAL_COMMUNITY): Payer: Self-pay | Admitting: Psychiatry

## 2021-09-05 ENCOUNTER — Ambulatory Visit (HOSPITAL_COMMUNITY): Payer: BC Managed Care – PPO | Admitting: Psychiatry

## 2021-09-11 NOTE — Progress Notes (Unsigned)
09/12/21- 51 yoF never smoker for sleep evaluation to re-establish with hx OSA Medical problem list includes  Mitral Regurgitation/ Prolapse, Diastolic Dysfunction, Allergic Rhinitis, Post-Concussion, Chronic Headache, Anxiety/Depression, OCD, Panic Attacks, Endometriosis,  Apnealink 08/15/10>>AHI 1, 10 min with SpO2 < 88% NPSG 10/23/10>>AHI 0, RDI 17.7, SpO2 low 95%. Download 02/10/2012:  Optimal pressure 9cm, no leaks, AHI well controlled, good compliance.  MSLT at [facility] 05/2011:  NORMAL (MSL 10.52min with no SOREMS) CPAP titration 05/2011:  AHI 7/hr on auto 5-15cm.  HST 07/18/21- AHI 1.8/ hr, desat to 92%, Normal per Dr Mayford Knife NPSG/MSLT ordered 6/29 by Dr Armanda Magic- not done Followed by Psychiatry- Dr Gilmore Laroche

## 2021-09-12 ENCOUNTER — Ambulatory Visit (INDEPENDENT_AMBULATORY_CARE_PROVIDER_SITE_OTHER): Payer: BC Managed Care – PPO | Admitting: Internal Medicine

## 2021-09-12 ENCOUNTER — Encounter: Payer: Self-pay | Admitting: Internal Medicine

## 2021-09-12 DIAGNOSIS — G4710 Hypersomnia, unspecified: Secondary | ICD-10-CM

## 2021-09-12 DIAGNOSIS — G4733 Obstructive sleep apnea (adult) (pediatric): Secondary | ICD-10-CM | POA: Diagnosis not present

## 2021-09-12 MED ORDER — AMPHETAMINE-DEXTROAMPHETAMINE 10 MG PO TABS: ORAL_TABLET | ORAL | 0 refills | Status: DC

## 2021-09-12 NOTE — Patient Instructions (Signed)
Script to try adderall 10 mg, once or twice daily  Try to hold onto regular waking and sleeping schedule. An occasional nap is fine.

## 2021-09-12 NOTE — Assessment & Plan Note (Signed)
There does not seem to be indication for repeating formal sleep studies at this time and she really does not want to have to repeat a MSLT. 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  We discussed options emphasizing adequate sleep and good sleep habits with regular schedule. We are going to try stimulant therapy with low-dose Adderall to see what effect that has.  Appropriate discussion done. Plan-Adderall 10 mg tabs once or twice daily.  Okay to start with one half tab as discussed.

## 2021-09-12 NOTE — Assessment & Plan Note (Signed)
By current criteria her previous studies have not shown significant sleep apnea.

## 2021-09-19 ENCOUNTER — Institutional Professional Consult (permissible substitution): Payer: BC Managed Care – PPO | Admitting: Internal Medicine

## 2021-10-03 ENCOUNTER — Ambulatory Visit (HOSPITAL_COMMUNITY): Payer: BC Managed Care – PPO | Admitting: Psychiatry

## 2021-10-14 ENCOUNTER — Telehealth: Payer: Self-pay | Admitting: Internal Medicine

## 2021-10-14 NOTE — Telephone Encounter (Signed)
Patient states Adderall was called in, but she is not able to take it with Prozac that she is on. Patient also states the medication mentioned over the counter has caffeine in it so she is not able to take it. Patient would like to know Dr. Janee Morn recommendation/ alternatives to take. Patient wanted to know what Dr.  Annamaria Boots thought about L-tyrosine.   Please call back at [phone].

## 2021-10-14 NOTE — Telephone Encounter (Signed)
Patient states Adderall was called in, but she is not able to take it with Prozac that she is on. Patient also states the medication mentioned over the counter has caffeine in it so she is not able to take it. Patient would like to know Dr. Janee Morn recommendation/ alternatives to take. Patient wanted to know what Dr.  Annamaria Boots thought about L-tyrosine.

## 2021-10-15 NOTE — Telephone Encounter (Signed)
I don't have any information to share on L-tyrosine. I don't think it would interfere with her other medicines, so if she wants to try it that's ok.

## 2021-10-15 NOTE — Telephone Encounter (Signed)
Called patient to inform her of the information from CY. Patient verbalized understanding. Nothing further needed

## 2021-10-15 NOTE — Telephone Encounter (Signed)
Called and spoke with patient. She stated that she did not receive a call phone earlier today. I went over the information from Dr. Annamaria Boots and she verbalized understanding.   She wanted to know what Dr. Annamaria Boots recommended over the counter that did not contain a lot of caffeine.   Dr. Annamaria Boots, can you please advise? Thanks!

## 2021-10-16 NOTE — Telephone Encounter (Signed)
Called and spoke with pt letting her know the info per CY and she verbalized understanding. Nothing further needed. 

## 2021-10-16 NOTE — Telephone Encounter (Signed)
Pretty much all of the stimulant products over the counter contain some amount of caffeine. The easiest for her would be to try a caffeine tab like NoDoz or Vivarin and break it into quarters. Try 1/4 tab and if not too strong she can adjust future doses as needed.

## 2021-10-31 ENCOUNTER — Ambulatory Visit (HOSPITAL_COMMUNITY): Payer: BC Managed Care – PPO | Admitting: Psychiatry

## 2021-11-11 DIAGNOSIS — L661 Lichen planopilaris: Secondary | ICD-10-CM | POA: Diagnosis not present

## 2021-11-21 ENCOUNTER — Ambulatory Visit (HOSPITAL_COMMUNITY): Payer: BC Managed Care – PPO | Admitting: Psychiatry

## 2021-11-28 ENCOUNTER — Ambulatory Visit (INDEPENDENT_AMBULATORY_CARE_PROVIDER_SITE_OTHER): Payer: BC Managed Care – PPO | Admitting: Psychiatry

## 2021-11-28 ENCOUNTER — Encounter (HOSPITAL_COMMUNITY): Payer: Self-pay | Admitting: Psychiatry

## 2021-11-28 VITALS — BP 104/66 | HR 88 | Temp 98.3°F | Ht 62.25 in | Wt 148.0 lb

## 2021-11-28 DIAGNOSIS — Z9189 Other specified personal risk factors, not elsewhere classified: Secondary | ICD-10-CM | POA: Diagnosis not present

## 2021-11-28 DIAGNOSIS — F429 Obsessive-compulsive disorder, unspecified: Secondary | ICD-10-CM

## 2021-11-28 DIAGNOSIS — F411 Generalized anxiety disorder: Secondary | ICD-10-CM

## 2021-11-28 MED ORDER — FLUOXETINE HCL 20 MG PO TABS: 40.0000 mg | ORAL_TABLET | Freq: Every day | ORAL | 0 refills | Status: DC

## 2021-11-28 NOTE — Progress Notes (Signed)
[facility] Outpatient Follow up visit   Patient Identification: Frances Ho MRN:  [id number] Date of Evaluation:  11/28/2021 Referral Source: Primary care Chief Complaint:    follow up depression, anxiety Visit Diagnosis:   GAD Insomnia  amotivation      HPI:  51   years old currently single Caucasian female who is living with her mom. Referred initially by primary care physician for management of sleep, anxiety  and OCD  Last visit suggested to add activities,  Also got in home sleep study was negative so got adderall for EDD or daytime sleepiness, hasn't use it worried about side effects Helped mom close in house, helps dad who is in nursing home   Klonopine helps with anxiety attacks   She procrastinates and has difficulty Prozac helps some Aggravating factor:finances, lonliness Modifying factor:family no psychosis     Past Medical History:  Past Medical History:  Diagnosis Date   Abnormal Pap smear of cervix 2009   Anxiety    Chronic headaches    Depression    Diastolic dysfunction 04/19/4008   Endometriosis    Genital warts    Heart murmur    Mitral valve prolapse    mild bileaflet MVP with mild MR on echo 12/2019   OCD (obsessive compulsive disorder)    OSA (obstructive sleep apnea) 07/30/2010   Tachycardia     Past Surgical History:  Procedure Laterality Date   COLPOSCOPY  3/09   w/ECC-neg   FOOT SURGERY  2005   left   PELVIC LAPAROSCOPY Bilateral 11/13/03   laparoscopic removal bilateral endometriomas    Family Psychiatric History: Mom : depression and anxiety  Family History:  Family History  Problem Relation Age of Onset   Skin cancer Mother    Irritable bowel syndrome Mother    Colon polyps Brother 55       colon polyps   Heart disease Maternal Grandmother    Heart disease Maternal Grandfather    Heart disease Paternal Grandmother    Diabetes Paternal Grandfather    Heart disease Paternal Grandfather    Diabetes Other         grandfather   Hypertension Other        grandfather/grandmother   Esophageal cancer Neg Hx    Pancreatic cancer Neg Hx    Stomach cancer Neg Hx     Social History:   Social History   Socioeconomic History   Marital status: Single    Spouse name: Not on file   Number of children: 0   Years of education: Not on file   Highest education level: Not on file  Occupational History   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    Social Determinants of Health   Financial Resource Strain: Not on file  Food Insecurity: Not on file  Transportation Needs: Not on file  Physical Activity: Not on file  Stress: Not on file  Social Connections: Not on file     Allergies:  No Known Allergies  Metabolic Disorder Labs: Lab Results  Component Value Date   HGBA1C 5.2 07/25/2021   MPG 99.67 05/01/2020   No results found for: "PROLACTIN" Lab Results  Component Value Date   CHOL 234 (H) 07/25/2021   TRIG 176 (H) 07/25/2021   HDL 65 07/25/2021   CHOLHDL 3.6 07/25/2021   VLDL 63 (H) 05/01/2020   LDLCALC 138 (H) 07/25/2021   LDLCALC 102 (H) 05/01/2020     Current Medications: Current Outpatient Medications  Medication Sig Dispense Refill   B Complex Vitamins (B COMPLEX PO) Take 1 tablet by mouth daily.     BIOTIN PO Take 1,000 mg by mouth.     clonazePAM (KLONOPIN) 0.5 MG tablet Take 1 tablet (0.5 mg total) by mouth 2 (two) times daily as needed for anxiety. 30 tablet 0   DENTA 5000 PLUS 1.1 % CREA dental cream Take 1 application by mouth daily.     drospirenone-ethinyl estradiol (NIKKI) 3-0.02 MG tablet Take 1 tablet  by mouth daily. 84 tablet 3   finasteride (PROSCAR) 5 MG tablet Take 2.5 mg by mouth daily.     GLUCOSA-CHONDR-NA CHONDR-MSM PO Take 1,500 mg by mouth daily.     Magnesium 100 MG CAPS Take 100 mg by mouth 2 (two) times daily.     Methylsulfonylmethane (MSM) 1000 MG CAPS Take 1 capsule by mouth daily.     Multiple Vitamin (MULTIVITAMIN) tablet Take 1 tablet by mouth daily.     amphetamine-dextroamphetamine (ADDERALL) 10 MG tablet 1 twice daily as needed for alertness (Patient not taking: Reported on 11/28/2021) 30 tablet 0   Fluocinolone Acetonide Body 0.01 % OIL Apply 1 application topically 2 (two) times daily. (Patient not taking: Reported on 11/28/2021)     FLUoxetine (PROZAC) 20 MG tablet Take 2 tablets (40 mg total) by mouth daily. 180 tablet 0   No current facility-administered medications for this visit.      Psychiatric Specialty Exam: Review of Systems  Cardiovascular:  Negative for chest pain.  Psychiatric/Behavioral:  Negative for substance abuse and suicidal ideas.     Blood pressure 104/66, pulse 88, temperature 98.3 F (36.8 C), height 5' 2.25" (1.581 m), weight 148 lb (67.1 kg), SpO2 98 %.Body mass index is 26.85 kg/m.  General Appearance: casual  Eye Contact:fair  Speech:  Normal Rate  Volume:  Decreased  Mood: fair  Affect:  congruent  Thought Process:  Goal Directed  Orientation:  Full (Time, Place, and Person)  Thought Content:  Rumination  Suicidal Thoughts:  No  Homicidal Thoughts:  No  Memory:  Immediate;   Fair Recent;   Fair  Judgement:  Fair  Insight:  Fair  Psychomotor Activity:  Decreased  Concentration:  Concentration: Fair and Attention Span: Fair  Recall:  [employer] of Knowledge:Fair  Language: Fair  Akathisia:  Negative  Handed:  Right  AIMS (if indicated):    Assets:  Desire for Improvement  ADL's:  Intact  Cognition: WNL  Sleep:  poor    Treatment Plan Summary: Medication management and Plan as follows     Prior documentation  reviewed  GAD and panic symptoms: worries related to family and mom, finances, provided supportive tehrapy, she feels prozac at 40mg  is fine and worries of side effects if increased  Also on klonopine prn or daily Work on distractions    OCD: baseline, continue prozac and klonopine   Depression: fair gets subdued but tries to distract, discussed to add activities continue prozac Direct care time spent in office 20 -25 min including chart  review and face to face Fu 102m.    Thresa Ross, MD 11/2/20233:02 PM

## 2021-12-03 ENCOUNTER — Telehealth (HOSPITAL_COMMUNITY): Payer: Self-pay

## 2021-12-03 NOTE — Telephone Encounter (Signed)
Medication refills - Patient left a message she needed her new orders to go to her [facility] in [facility] instead of the [facility] in [place]. Patient requests Dr. De Nurse resend these prescriptions to be filled. Returns next on 03/06/22.

## 2021-12-04 MED ORDER — FLUOXETINE HCL 20 MG PO TABS: 40.0000 mg | ORAL_TABLET | Freq: Every day | ORAL | 0 refills | Status: DC

## 2021-12-04 MED ORDER — CLONAZEPAM 0.5 MG PO TABS
0.5000 mg | ORAL_TABLET | Freq: Two times a day (BID) | ORAL | 0 refills | Status: DC | PRN
Start: 2021-12-04 — End: 2023-03-03

## 2021-12-05 ENCOUNTER — Other Ambulatory Visit (HOSPITAL_COMMUNITY): Payer: Self-pay | Admitting: Psychiatry

## 2022-01-07 DIAGNOSIS — H43393 Other vitreous opacities, bilateral: Secondary | ICD-10-CM | POA: Diagnosis not present

## 2022-01-07 DIAGNOSIS — H524 Presbyopia: Secondary | ICD-10-CM | POA: Diagnosis not present

## 2022-01-07 DIAGNOSIS — H5203 Hypermetropia, bilateral: Secondary | ICD-10-CM | POA: Diagnosis not present

## 2022-01-07 DIAGNOSIS — H04123 Dry eye syndrome of bilateral lacrimal glands: Secondary | ICD-10-CM | POA: Diagnosis not present

## 2022-01-24 ENCOUNTER — Ambulatory Visit (HOSPITAL_COMMUNITY): Payer: BC Managed Care – PPO | Attending: Internal Medicine

## 2022-01-24 DIAGNOSIS — I341 Nonrheumatic mitral (valve) prolapse: Secondary | ICD-10-CM | POA: Diagnosis not present

## 2022-01-24 LAB — ECHOCARDIOGRAM COMPLETE
Area-P 1/2: 3.99 cm2
MV M vel: 4.87 m/s
MV Peak grad: 94.9 mmHg
Radius: 0.7 cm
S' Lateral: 2.6 cm

## 2022-01-28 ENCOUNTER — Telehealth: Payer: Self-pay

## 2022-01-28 NOTE — Telephone Encounter (Signed)
Spoke with pt. Pt was notified of echocardiogram results. Waiting on a return call.

## 2022-03-04 ENCOUNTER — Other Ambulatory Visit (HOSPITAL_COMMUNITY): Payer: Self-pay | Admitting: Psychiatry

## 2022-03-06 ENCOUNTER — Ambulatory Visit (HOSPITAL_COMMUNITY): Payer: BC Managed Care – PPO | Admitting: Psychiatry

## 2022-03-19 ENCOUNTER — Telehealth (HOSPITAL_COMMUNITY): Payer: BC Managed Care – PPO | Admitting: Psychiatry

## 2022-03-20 ENCOUNTER — Ambulatory Visit: Payer: BC Managed Care – PPO | Admitting: Internal Medicine

## 2022-04-10 ENCOUNTER — Ambulatory Visit: Payer: BC Managed Care – PPO | Admitting: Internal Medicine

## 2022-04-15 ENCOUNTER — Encounter (HOSPITAL_COMMUNITY): Payer: Self-pay | Admitting: Psychiatry

## 2022-04-15 ENCOUNTER — Ambulatory Visit (INDEPENDENT_AMBULATORY_CARE_PROVIDER_SITE_OTHER): Payer: BC Managed Care – PPO | Admitting: Psychiatry

## 2022-04-15 DIAGNOSIS — Z9189 Other specified personal risk factors, not elsewhere classified: Secondary | ICD-10-CM

## 2022-04-15 DIAGNOSIS — F411 Generalized anxiety disorder: Secondary | ICD-10-CM | POA: Diagnosis not present

## 2022-04-15 DIAGNOSIS — F429 Obsessive-compulsive disorder, unspecified: Secondary | ICD-10-CM | POA: Diagnosis not present

## 2022-04-15 MED ORDER — FLUOXETINE HCL 10 MG PO CAPS: 10.0000 mg | ORAL_CAPSULE | Freq: Every day | ORAL | 0 refills | Status: DC

## 2022-04-15 NOTE — Progress Notes (Signed)
[facility] Outpatient Follow up visit   Patient Identification: Frances Ho MRN:  [id number] Date of Evaluation:  04/15/2022 Referral Source: Primary care Chief Complaint:    follow up depression, anxiety Visit Diagnosis:   GAD Insomnia  amotivation      HPI:  52   years old currently single Caucasian female who is living with her mom. Referred initially by primary care physician for management of sleep, anxiety  and OCD  Gets obsessive with thoughts and worries,  Says maybe need to increase prozac but worries it may enhance tinnitis  Discussed to take klonopine more often instead of prn  Helped mom close in house, helps dad who is in nursing home   Klonopine helps with anxiety attacks   She procrastinates and has difficulty  Aggravating factor:finances, lonliness  Modifying factor: family no psychosis     Past Medical History:  Past Medical History:  Diagnosis Date   Abnormal Pap smear of cervix 2009   Anxiety    Chronic headaches    Depression    Diastolic dysfunction 0000000   Endometriosis    Genital warts    Heart murmur    Mitral valve prolapse    mild bileaflet MVP with mild MR on echo 12/2019   OCD (obsessive compulsive disorder)    OSA (obstructive sleep apnea) 07/30/2010   Tachycardia     Past Surgical History:  Procedure Laterality Date   COLPOSCOPY  3/09   w/ECC-neg   FOOT SURGERY  2005   left   PELVIC LAPAROSCOPY Bilateral 11/13/03   laparoscopic removal bilateral endometriomas    Family Psychiatric History: Mom : depression and anxiety  Family History:  Family History  Problem Relation Age of Onset   Skin cancer Mother    Irritable bowel syndrome Mother    Colon polyps Brother 73       colon polyps   Heart disease Maternal Grandmother    Heart disease Maternal Grandfather    Heart disease Paternal Grandmother    Diabetes Paternal Grandfather    Heart disease Paternal Grandfather    Diabetes Other        grandfather    Hypertension Other        grandfather/grandmother   Esophageal cancer Neg Hx    Pancreatic cancer Neg Hx    Stomach cancer Neg Hx     Social History:   Social History   Socioeconomic History   Marital status: Single    Spouse name: Not on file   Number of children: 0   Years of education: Not on file   Highest education level: Not on file  Occupational History   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    Social Determinants of Health   Financial Resource Strain: Not on file  Food Insecurity: Not on file  Transportation Needs: Not on file  Physical Activity: Not on file  Stress: Not on file  Social Connections: Not on file     Allergies:  No Known Allergies  Metabolic Disorder Labs: Lab Results  Component Value Date   HGBA1C 5.2 07/25/2021   MPG 99.67 05/01/2020   No results found for: "PROLACTIN" Lab Results  Component Value Date   CHOL 234 (H) 07/25/2021   TRIG 176 (H) 07/25/2021   HDL 65 07/25/2021   CHOLHDL 3.6 07/25/2021   VLDL 63 (H) 05/01/2020   LDLCALC 138 (H) 07/25/2021   LDLCALC 102 (H) 05/01/2020     Current Medications: Current Outpatient Medications  Medication Sig Dispense Refill   amphetamine-dextroamphetamine (ADDERALL) 10 MG tablet 1 twice daily as needed for alertness (Patient not taking: Reported on 11/28/2021) 30 tablet 0   B Complex Vitamins (B COMPLEX PO) Take 1 tablet by mouth daily.     BIOTIN PO Take 1,000 mg by mouth.     clonazePAM (KLONOPIN) 0.5 MG tablet Take 1 tablet (0.5 mg total) by mouth 2 (two) times daily as needed for anxiety. 30 tablet 0   DENTA 5000  PLUS 1.1 % CREA dental cream Take 1 application by mouth daily.     drospirenone-ethinyl estradiol (NIKKI) 3-0.02 MG tablet Take 1 tablet by mouth daily. 84 tablet 3   finasteride (PROSCAR) 5 MG tablet Take 2.5 mg by mouth daily.     Fluocinolone Acetonide Body 0.01 % OIL Apply 1 application topically 2 (two) times daily. (Patient not taking: Reported on 11/28/2021)     FLUoxetine (PROZAC) 20 MG tablet TAKE 2 TABLETS BY MOUTH DAILY 180 tablet 0   GLUCOSA-CHONDR-NA CHONDR-MSM PO Take 1,500 mg by mouth daily.     Magnesium 100 MG CAPS Take 100 mg by mouth 2 (two) times daily.     Methylsulfonylmethane (MSM) 1000 MG CAPS Take 1 capsule by mouth daily.     Multiple Vitamin (MULTIVITAMIN) tablet Take 1 tablet by mouth daily.     No current facility-administered medications for this visit.      Psychiatric Specialty Exam: Review of Systems  Cardiovascular:  Negative for chest pain.  Psychiatric/Behavioral:  Negative for substance abuse and suicidal ideas. The patient is nervous/anxious.     There were no vitals taken for this visit.There is no height or weight on file to calculate BMI.  General Appearance: casual  Eye Contact:fair  Speech:  Normal Rate  Volume:  Decreased  Mood: fair  Affect:  congruent  Thought Process:  Goal Directed  Orientation:  Full (Time, Place, and Person)  Thought Content:  Rumination  Suicidal Thoughts:  No  Homicidal Thoughts:  No  Memory:  Immediate;   Fair Recent;   Fair  Judgement:  Fair  Insight:  Fair  Psychomotor Activity:  Decreased  Concentration:  Concentration: Fair and Attention Span: Fair  Recall:  [employer] of Knowledge:Fair  Language: Fair  Akathisia:  Negative  Handed:  Right  AIMS (if indicated):    Assets:  Desire for Improvement  ADL's:  Intact  Cognition: WNL  Sleep:  poor    Treatment Plan Summary: Medication management and Plan as follows     Prior documentation reviewed  GAD and panic symptoms: gets anxious, tries to  distract, discussed option of increase prozac , remains reluctant discussed other options but she plans to keep it as is but then agrees to add 10mg  , total dose will be 50mg  Take klonopine prn to more regular   OCD: baseline, gets exacerbated at times, discussed to take klonopine more often    Depression: fluctuates but not worse, continue prozac and add 10mg  more prozac  Direct care time spent in office 2  0 plus minutes including chart review and face to face Fu 40m.    Merian Capron, MD 3/19/20244:39 PM

## 2022-05-20 ENCOUNTER — Other Ambulatory Visit: Payer: Self-pay | Admitting: Obstetrics & Gynecology

## 2022-05-20 DIAGNOSIS — Z1231 Encounter for screening mammogram for malignant neoplasm of breast: Secondary | ICD-10-CM

## 2022-05-22 ENCOUNTER — Telehealth (HOSPITAL_COMMUNITY): Payer: Self-pay

## 2022-05-22 MED ORDER — FLUOXETINE HCL 10 MG PO CAPS: 10.0000 mg | ORAL_CAPSULE | Freq: Every day | ORAL | 0 refills | Status: DC

## 2022-05-22 NOTE — Telephone Encounter (Signed)
Medication refill request - Call message from patient requesting a new Fluoxetine 10 mg order be sent into the [facility] in [place], [place], not the [facility] pharmacy previously used and for 30 days. Patient's last order for 30 days with no refills was e-scribed when patient was last seen 04/15/22.  Patient returns next on 07/15/22.

## 2022-05-22 NOTE — Telephone Encounter (Signed)
Medication management -Message left for patient that Dr. Gilmore Laroche had sent in her requested new Fluoxetine order to her [facility] in [place] as requested.

## 2022-05-23 ENCOUNTER — Telehealth (HOSPITAL_COMMUNITY): Payer: Self-pay

## 2022-05-23 DIAGNOSIS — F411 Generalized anxiety disorder: Secondary | ICD-10-CM

## 2022-05-23 MED ORDER — FLUOXETINE HCL 10 MG PO TABS: 10.0000 mg | ORAL_TABLET | Freq: Every day | ORAL | 0 refills | Status: DC

## 2022-05-23 NOTE — Telephone Encounter (Signed)
Medication problem - Patient called back to request Dr. Gilmore Laroche send in Fluoxetine 10 mg, the tablets and not the capsules to her [facility] in [place].

## 2022-05-23 NOTE — Telephone Encounter (Signed)
Medication management - Telephone message left for patient that her message was received that she needed the tablet form of Fluoxetine 10 mg filled, not the capsule. Dr. Gilmore Laroche authorized this cange and e-scribed the 10 mg tablet form, one a day, #30 to patient's [facility] in [place] for the change as requested and let patient know this was sent as requested.

## 2022-05-28 ENCOUNTER — Telehealth (HOSPITAL_COMMUNITY): Payer: Self-pay

## 2022-05-28 DIAGNOSIS — F411 Generalized anxiety disorder: Secondary | ICD-10-CM

## 2022-05-28 NOTE — Telephone Encounter (Signed)
Medication referral - Fax from [facility]'s [facility], now requesting a 90 day order for patient's prescribed Fluoxetine 10 mg capsules, and last provided for 30 days on 05/23/22. Patient returns next on 07/15/22.

## 2022-05-29 MED ORDER — FLUOXETINE HCL 10 MG PO TABS: 10.0000 mg | ORAL_TABLET | Freq: Every day | ORAL | 0 refills | Status: DC

## 2022-06-01 ENCOUNTER — Other Ambulatory Visit (HOSPITAL_COMMUNITY): Payer: Self-pay | Admitting: Psychiatry

## 2022-06-05 DIAGNOSIS — L578 Other skin changes due to chronic exposure to nonionizing radiation: Secondary | ICD-10-CM | POA: Diagnosis not present

## 2022-06-05 DIAGNOSIS — D485 Neoplasm of uncertain behavior of skin: Secondary | ICD-10-CM | POA: Diagnosis not present

## 2022-06-05 DIAGNOSIS — D225 Melanocytic nevi of trunk: Secondary | ICD-10-CM | POA: Diagnosis not present

## 2022-06-05 DIAGNOSIS — L719 Rosacea, unspecified: Secondary | ICD-10-CM | POA: Diagnosis not present

## 2022-06-05 DIAGNOSIS — D2271 Melanocytic nevi of right lower limb, including hip: Secondary | ICD-10-CM | POA: Diagnosis not present

## 2022-06-05 DIAGNOSIS — D2230 Melanocytic nevi of unspecified part of face: Secondary | ICD-10-CM | POA: Diagnosis not present

## 2022-06-16 DIAGNOSIS — L661 Lichen planopilaris: Secondary | ICD-10-CM | POA: Diagnosis not present

## 2022-06-16 DIAGNOSIS — L649 Androgenic alopecia, unspecified: Secondary | ICD-10-CM | POA: Diagnosis not present

## 2022-06-16 DIAGNOSIS — Z5181 Encounter for therapeutic drug level monitoring: Secondary | ICD-10-CM | POA: Diagnosis not present

## 2022-06-24 ENCOUNTER — Ambulatory Visit
Admission: RE | Admit: 2022-06-24 | Discharge: 2022-06-24 | Disposition: A | Discharge status: Home / Self Care | Payer: BC Managed Care – PPO | Source: Ambulatory Visit | Attending: Obstetrics & Gynecology | Admitting: Obstetrics & Gynecology

## 2022-06-24 DIAGNOSIS — Z1231 Encounter for screening mammogram for malignant neoplasm of breast: Secondary | ICD-10-CM

## 2022-07-15 ENCOUNTER — Ambulatory Visit (HOSPITAL_COMMUNITY): Payer: BC Managed Care – PPO | Admitting: Psychiatry

## 2022-07-29 ENCOUNTER — Ambulatory Visit (HOSPITAL_COMMUNITY): Payer: BC Managed Care – PPO | Admitting: Psychiatry

## 2022-08-11 ENCOUNTER — Encounter (HOSPITAL_BASED_OUTPATIENT_CLINIC_OR_DEPARTMENT_OTHER): Payer: Self-pay | Admitting: Obstetrics & Gynecology

## 2022-08-11 ENCOUNTER — Other Ambulatory Visit (HOSPITAL_COMMUNITY)
Admission: RE | Admit: 2022-08-11 | Discharge: 2022-08-11 | Disposition: A | Discharge status: Home / Self Care | Payer: BC Managed Care – PPO | Source: Ambulatory Visit | Attending: Obstetrics & Gynecology | Admitting: Obstetrics & Gynecology

## 2022-08-11 ENCOUNTER — Ambulatory Visit (HOSPITAL_BASED_OUTPATIENT_CLINIC_OR_DEPARTMENT_OTHER): Payer: BC Managed Care – PPO | Admitting: Obstetrics & Gynecology

## 2022-08-11 ENCOUNTER — Ambulatory Visit (INDEPENDENT_AMBULATORY_CARE_PROVIDER_SITE_OTHER): Payer: BC Managed Care – PPO | Admitting: Obstetrics & Gynecology

## 2022-08-11 VITALS — BP 122/79 | HR 88 | Ht 62.0 in | Wt 147.6 lb

## 2022-08-11 DIAGNOSIS — R5382 Chronic fatigue, unspecified: Secondary | ICD-10-CM | POA: Diagnosis not present

## 2022-08-11 DIAGNOSIS — Z124 Encounter for screening for malignant neoplasm of cervix: Secondary | ICD-10-CM | POA: Insufficient documentation

## 2022-08-11 DIAGNOSIS — Z01419 Encounter for gynecological examination (general) (routine) without abnormal findings: Secondary | ICD-10-CM

## 2022-08-11 DIAGNOSIS — N809 Endometriosis, unspecified: Secondary | ICD-10-CM

## 2022-08-11 DIAGNOSIS — F411 Generalized anxiety disorder: Secondary | ICD-10-CM

## 2022-08-11 DIAGNOSIS — E785 Hyperlipidemia, unspecified: Secondary | ICD-10-CM

## 2022-08-11 DIAGNOSIS — N951 Menopausal and female climacteric states: Secondary | ICD-10-CM | POA: Diagnosis not present

## 2022-08-11 DIAGNOSIS — Z8742 Personal history of other diseases of the female genital tract: Secondary | ICD-10-CM

## 2022-08-11 MED ORDER — DROSPIRENONE-ETHINYL ESTRADIOL 3-0.02 MG PO TABS
1.0000 | ORAL_TABLET | Freq: Every day | ORAL | 3 refills | Status: DC
Start: 2022-08-11 — End: 2023-09-07

## 2022-08-11 NOTE — Progress Notes (Signed)
52 y.o. G0P0 Single Other or two or more races female here for annual exam.  On OCPs.  Still having menstrual cycles.  Flow is about three days.  She is having hot flashes at night.  She has noticed central weight distrubution change.  She does have sleep issues and fatigue as well.  Brother has throat cancer.  Has completed therapy.    Is followed by Dr. Gilmore Laroche in behavioral health and Dr Darreld Mclean for alopecia issues.          Sexually active: No.  Smoker:  no  Health Maintenance: Pap:  07/25/2021 Negative MMG:  06/24/2022 Negative Colonoscopy:  01/01/2021, follow up 10 years BMD:   not indicated Screening Labs: ordered   reports that she has never smoked. She has never used smokeless tobacco. She reports that she does not drink alcohol and does not use drugs.  Past Medical History:  Diagnosis Date   Abnormal Pap smear of cervix 2009   Anxiety    Chronic headaches    Depression    Diastolic dysfunction 03/12/2016   Endometriosis    Genital warts    Heart murmur    Mitral valve prolapse    mild bileaflet MVP with mild MR on echo 12/2019   OCD (obsessive compulsive disorder)    OSA (obstructive sleep apnea) 07/30/2010   Tachycardia     Past Surgical History:  Procedure Laterality Date   COLPOSCOPY  3/09   w/ECC-neg   FOOT SURGERY  2005   left   PELVIC LAPAROSCOPY Bilateral 11/13/03   laparoscopic removal bilateral endometriomas    Current Outpatient Medications  Medication Sig Dispense Refill   B Complex Vitamins (B COMPLEX PO) Take 1 tablet by mouth daily.     BIOTIN PO Take 1,000 mg by mouth.     clonazePAM (KLONOPIN) 0.5 MG tablet Take 1 tablet (0.5 mg total) by mouth 2 (two) times daily as needed for anxiety. 30 tablet 0   DENTA 5000 PLUS 1.1 % CREA dental cream Take 1 application by mouth daily.     finasteride (PROSCAR) 5 MG tablet Take 2.5 mg by mouth daily.     FLUoxetine (PROZAC) 10 MG tablet Take 1 tablet (10 mg total) by mouth daily. 90 tablet 0    FLUoxetine (PROZAC) 20 MG tablet TAKE 2 TABLETS BY MOUTH DAILY 180 tablet 0   Magnesium 100 MG CAPS Take 100 mg by mouth 2 (two) times daily.     Multiple Vitamin (MULTIVITAMIN) tablet Take 1 tablet by mouth daily.     drospirenone-ethinyl estradiol (NIKKI) 3-0.02 MG tablet Take 1 tablet by mouth daily. 84 tablet 3   Fluocinolone Acetonide Body 0.01 % OIL Apply 1 application topically 2 (two) times daily. (Patient not taking: Reported on 11/28/2021)     GLUCOSA-CHONDR-NA CHONDR-MSM PO Take 1,500 mg by mouth daily. (Patient not taking: Reported on 08/11/2022)     Methylsulfonylmethane (MSM) 1000 MG CAPS Take 1 capsule by mouth daily. (Patient not taking: Reported on 08/11/2022)     No current facility-administered medications for this visit.    Family History  Problem Relation Age of Onset   Skin cancer Mother    Irritable bowel syndrome Mother    Colon polyps Brother 25       colon polyps   Heart disease Maternal Grandmother    Heart disease Maternal Grandfather    Heart disease Paternal Grandmother    Diabetes Paternal Grandfather    Heart disease Paternal Grandfather    Diabetes  Other        grandfather   Hypertension Other        grandfather/grandmother   Esophageal cancer Neg Hx    Pancreatic cancer Neg Hx    Stomach cancer Neg Hx     ROS: Constitutional: negative Genitourinary:negative  Exam:   BP 122/79 (BP Location: Left Arm, Patient Position: Sitting, Cuff Size: Large)   Pulse 88   Ht 5\' 2"  (1.575 m) Comment: Reported  Wt 147 lb 9.6 oz (67 kg)   BMI 27.00 kg/m   Height: 5\' 2"  (157.5 cm) (Reported)  General appearance: alert, cooperative and appears stated age Head: Normocephalic, without obvious abnormality, atraumatic Neck: no adenopathy, supple, symmetrical, trachea midline and thyroid normal to inspection and palpation Lungs: clear to auscultation bilaterally Breasts: normal appearance, no masses or tenderness Heart: regular rate and rhythm Abdomen: soft,  non-tender; bowel sounds normal; no masses,  no organomegaly Extremities: extremities normal, atraumatic, no cyanosis or edema Skin: Skin color, texture, turgor normal. No rashes or lesions Lymph nodes: Cervical, supraclavicular, and axillary nodes normal. No abnormal inguinal nodes palpated Neurologic: Grossly normal   Pelvic: External genitalia:  no lesions              Urethra:  normal appearing urethra with no masses, tenderness or lesions              Bartholins and Skenes: normal                 Vagina: normal appearing vagina with normal color and no discharge, no lesions              Cervix: no lesions              Pap taken: Yes.   Bimanual Exam:  Uterus:  normal size, contour, position, consistency, mobility, non-tender              Adnexa: normal adnexa and no mass, fullness, tenderness               Rectovaginal: Confirms               Anus:  normal sphincter tone, no lesions  Chaperone, Ina Homes, CMA, was present for exam.  Assessment/Plan: 1. Well woman exam with routine gynecological exam - Pap smear obtained today per pt request.  She desires this yearly. - Mammogram up to date - Colonoscopy 2022.  Follow up 10 years - Bone mineral density not indicated - lab work done ordered today - vaccines reviewed  2. Perimenopausal vasomotor symptoms - Follicle stimulating hormone  3. Cervical cancer screening - Cytology - PAP  4. Elevated lipids - Lipid panel - Hemoglobin A1c - Comprehensive metabolic panel  5. Chronic fatigue - TSH - CBC  6. Endometriosis - drospirenone-ethinyl estradiol (NIKKI) 3-0.02 MG tablet; Take 1 tablet by mouth daily.  Dispense: 84 tablet; Refill: 3  7. GAD (generalized anxiety disorder) - followed by Dr. Gilmore Laroche

## 2022-08-12 LAB — COMPREHENSIVE METABOLIC PANEL
ALT: 21 IU/L (ref 0–32)
AST: 28 IU/L (ref 0–40)
Albumin: 4 g/dL (ref 3.8–4.9)
Alkaline Phosphatase: 109 IU/L (ref 44–121)
BUN/Creatinine Ratio: 12 (ref 9–23)
BUN: 11 mg/dL (ref 6–24)
Bilirubin Total: 0.2 mg/dL (ref 0.0–1.2)
CO2: 21 mmol/L (ref 20–29)
Calcium: 8.6 mg/dL — ABNORMAL LOW (ref 8.7–10.2)
Chloride: 99 mmol/L (ref 96–106)
Creatinine, Ser: 0.91 mg/dL (ref 0.57–1.00)
Globulin, Total: 2.9 g/dL (ref 1.5–4.5)
Glucose: 84 mg/dL (ref 70–99)
Potassium: 3.8 mmol/L (ref 3.5–5.2)
Sodium: 134 mmol/L (ref 134–144)
Total Protein: 6.9 g/dL (ref 6.0–8.5)
eGFR: 76 mL/min/{1.73_m2} (ref >59)

## 2022-08-12 LAB — HEMOGLOBIN A1C
Est. average glucose Bld gHb Est-mCnc: 103 mg/dL
Hgb A1c MFr Bld: 5.2 % (ref 4.8–5.6)

## 2022-08-12 LAB — CBC
Hematocrit: 39.3 % (ref 34.0–46.6)
Hemoglobin: 12.9 g/dL (ref 11.1–15.9)
MCH: 29.5 pg (ref 26.6–33.0)
MCHC: 32.8 g/dL (ref 31.5–35.7)
MCV: 90 fL (ref 79–97)
Platelets: 302 10*3/uL (ref 150–450)
RBC: 4.38 x10E6/uL (ref 3.77–5.28)
RDW: 12.1 % (ref 11.7–15.4)
WBC: 4.8 10*3/uL (ref 3.4–10.8)

## 2022-08-12 LAB — TSH: TSH: 2.66 u[IU]/mL (ref 0.450–4.500)

## 2022-08-12 LAB — LIPID PANEL
Chol/HDL Ratio: 3.4 ratio (ref 0.0–4.4)
Cholesterol, Total: 229 mg/dL — ABNORMAL HIGH (ref 100–199)
HDL: 67 mg/dL (ref >39)
LDL Chol Calc (NIH): 143 mg/dL — ABNORMAL HIGH (ref 0–99)
Triglycerides: 107 mg/dL (ref 0–149)
VLDL Cholesterol Cal: 19 mg/dL (ref 5–40)

## 2022-08-12 LAB — FOLLICLE STIMULATING HORMONE: FSH: 2.5 m[IU]/mL

## 2022-08-14 LAB — CYTOLOGY - PAP
Comment: NEGATIVE
Diagnosis: UNDETERMINED — AB
High risk HPV: NEGATIVE

## 2022-08-19 ENCOUNTER — Ambulatory Visit (INDEPENDENT_AMBULATORY_CARE_PROVIDER_SITE_OTHER): Payer: BC Managed Care – PPO | Admitting: Psychiatry

## 2022-08-19 ENCOUNTER — Encounter (HOSPITAL_COMMUNITY): Payer: Self-pay | Admitting: Psychiatry

## 2022-08-19 VITALS — BP 109/76 | HR 70 | Ht 62.0 in | Wt 149.0 lb

## 2022-08-19 DIAGNOSIS — F429 Obsessive-compulsive disorder, unspecified: Secondary | ICD-10-CM | POA: Diagnosis not present

## 2022-08-19 DIAGNOSIS — Z9189 Other specified personal risk factors, not elsewhere classified: Secondary | ICD-10-CM | POA: Diagnosis not present

## 2022-08-19 DIAGNOSIS — F411 Generalized anxiety disorder: Secondary | ICD-10-CM

## 2022-08-19 NOTE — Progress Notes (Signed)
[facility] Outpatient Follow up visit   Patient Identification: Frances Ho MRN:  [id number] Date of Evaluation:  08/19/2022 Referral Source: Primary care Chief Complaint:   Chief Complaint   Follow-up    follow up depression, anxiety Visit Diagnosis:   GAD Insomnia  amotivation      HPI:  52   years old currently single Caucasian female who is living with her mom. Referred initially by primary care physician for management of sleep, anxiety  and OCD  Gets obsessive with thoughts and worries,  Brother got diagnosed and treated for throat cancer. 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 but prefer if she can work from home    Klonopine helps with anxiety attacks  Procrastinates but less intense  Aggravating factor:finances, lonliness, brother sick  Modifying factor: family no psychosis     Past Medical History:  Past Medical History:  Diagnosis Date   Abnormal Pap smear of cervix 2009   Anxiety    Chronic headaches    Depression    Diastolic dysfunction 03/12/2016   Endometriosis    Genital warts    Heart murmur    Mitral valve prolapse    mild bileaflet MVP with mild MR on echo 12/2019   OCD (obsessive compulsive disorder)    OSA (obstructive sleep apnea) 07/30/2010   Tachycardia     Past Surgical History:  Procedure Laterality Date   COLPOSCOPY  3/09   w/ECC-neg   FOOT SURGERY  2005   left   PELVIC LAPAROSCOPY Bilateral 11/13/03   laparoscopic removal bilateral endometriomas    Family Psychiatric History: Mom : depression and anxiety  Family History:  Family History  Problem Relation Age of Onset   Skin cancer Mother    Irritable bowel syndrome Mother    Colon polyps Brother 65       colon polyps   Heart disease Maternal Grandmother    Heart disease Maternal Grandfather    Heart disease Paternal Grandmother    Diabetes Paternal Grandfather    Heart disease Paternal  Grandfather    Diabetes Other        grandfather   Hypertension Other        grandfather/grandmother   Esophageal cancer Neg Hx    Pancreatic cancer Neg Hx    Stomach cancer Neg Hx     Social History:   Social History   Socioeconomic History   Marital status: Single    Spouse name: Not on file   Number of children: 0   Years of education: Not on file   Highest education level: Not on file  Occupational History   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    Social Determinants of Health   Financial Resource Strain: Not on file  Food Insecurity: Not on file  Transportation Needs: Not on file  Physical Activity: Not on file  Stress: Not on file  Social Connections: Not on file     Allergies:  No  Known Allergies  Metabolic Disorder Labs: Lab Results  Component Value Date   HGBA1C 5.2 08/11/2022   MPG 99.67 05/01/2020   No results found for: "PROLACTIN" Lab Results  Component Value Date   CHOL 229 (H) 08/11/2022   TRIG 107 08/11/2022   HDL 67 08/11/2022   CHOLHDL 3.4 08/11/2022   VLDL 63 (H) 05/01/2020   LDLCALC 143 (H) 08/11/2022   LDLCALC 138 (H) 07/25/2021     Current Medications: Current Outpatient Medications  Medication Sig Dispense Refill   B Complex Vitamins (B COMPLEX PO) Take 1 tablet by mouth daily.     BIOTIN PO Take 1,000 mg by mouth.     clonazePAM (KLONOPIN) 0.5 MG tablet Take 1 tablet (0.5 mg total) by mouth 2 (two) times daily as needed for anxiety. 30 tablet 0   DENTA 5000 PLUS 1.1 % CREA dental cream Take 1 application by mouth daily.     drospirenone-ethinyl estradiol  (NIKKI) 3-0.02 MG tablet Take 1 tablet by mouth daily. 84 tablet 3   finasteride (PROSCAR) 5 MG tablet Take 2.5 mg by mouth daily.     Fluocinolone Acetonide Body 0.01 % OIL Apply 1 application  topically 2 (two) times daily.     FLUoxetine (PROZAC) 10 MG tablet Take 1 tablet (10 mg total) by mouth daily. 90 tablet 0   FLUoxetine (PROZAC) 20 MG tablet TAKE 2 TABLETS BY MOUTH DAILY 180 tablet 0   GLUCOSA-CHONDR-NA CHONDR-MSM PO Take 1,500 mg by mouth daily.     Magnesium 100 MG CAPS Take 100 mg by mouth 2 (two) times daily.     Methylsulfonylmethane (MSM) 1000 MG CAPS Take 1 capsule by mouth daily.     Multiple Vitamin (MULTIVITAMIN) tablet Take 1 tablet by mouth daily.     No current facility-administered medications for this visit.      Psychiatric Specialty Exam: Review of Systems  Cardiovascular:  Negative for chest pain.  Psychiatric/Behavioral:  Negative for substance abuse and suicidal ideas.     Blood pressure 109/76, pulse 70, height 5\' 2"  (1.575 m), weight 149 lb (67.6 kg).Body mass index is 27.25 kg/m.  General Appearance: casual  Eye Contact:fair  Speech:  Normal Rate  Volume:  Decreased  Mood: fair  Affect:  congruent  Thought Process:  Goal Directed  Orientation:  Full (Time, Place, and Person)  Thought Content:  Rumination  Suicidal Thoughts:  No  Homicidal Thoughts:  No  Memory:  Immediate;   Fair Recent;   Fair  Judgement:  Fair  Insight:  Fair  Psychomotor Activity:  Decreased  Concentration:  Concentration: Fair and Attention Span: Fair  Recall:  [employer] of Knowledge:Fair  Language: Fair  Akathisia:  Negative  Handed:  Right  AIMS (if indicated):    Assets:  Desire for Improvement  ADL's:  Intact  Cognition: WNL  Sleep:  poor    Treatment Plan Summary: Medication management and Plan as follows     Prior documentation reviewed  GAD and panic symptoms: managing but has some stressors dealing with, prozac was incresed to 50mg  will  continue Take klonopine prn to more regular   OCD: somewhat manageable, continue prozac  Feel tinnitis gets worse with high dose. She doesn't want to lower and thinking if she can increase later     Depression: fluctuates but manageable continue prozac   Direct care time spent in office 20 minutes  including chart review and face to face Fu 75m.    Thresa Ross,  MD 7/23/20243:47 PM

## 2022-08-22 ENCOUNTER — Telehealth (HOSPITAL_BASED_OUTPATIENT_CLINIC_OR_DEPARTMENT_OTHER): Payer: Self-pay | Admitting: *Deleted

## 2022-08-22 NOTE — Telephone Encounter (Signed)
Patient called and would like to know if Dr. Hyacinth Meeker has any recommendations for a female PCP in [place]?

## 2022-09-01 NOTE — Telephone Encounter (Signed)
LMOVM that Dr. Hyacinth Meeker is out of office and will return 8/12. Advised that I would ask about PCP recommendations once she returns. Advised to call with any questions.

## 2022-09-05 ENCOUNTER — Other Ambulatory Visit (HOSPITAL_COMMUNITY): Payer: Self-pay | Admitting: Psychiatry

## 2022-09-09 NOTE — Telephone Encounter (Signed)
LMOVM that pt could try to schedule appt with [facility] in [place] if looking for PCP. Advised to call with any questions.

## 2022-09-10 DIAGNOSIS — D485 Neoplasm of uncertain behavior of skin: Secondary | ICD-10-CM | POA: Diagnosis not present

## 2022-09-10 DIAGNOSIS — D2271 Melanocytic nevi of right lower limb, including hip: Secondary | ICD-10-CM | POA: Diagnosis not present

## 2022-09-19 ENCOUNTER — Telehealth (HOSPITAL_COMMUNITY): Payer: Self-pay | Admitting: *Deleted

## 2022-09-19 DIAGNOSIS — F411 Generalized anxiety disorder: Secondary | ICD-10-CM

## 2022-09-19 MED ORDER — FLUOXETINE HCL 10 MG PO TABS
10.0000 mg | ORAL_TABLET | Freq: Every day | ORAL | 0 refills | Status: DC
Start: 2022-09-19 — End: 2022-12-19

## 2022-09-19 NOTE — Addendum Note (Signed)
Addended by: Thresa Ross on: 09/19/2022 09:54 AM   Modules accepted: Orders

## 2022-09-19 NOTE — Telephone Encounter (Signed)
Rx Refill Request--[id number] [place], [place] - [address]   FLUoxetine (PROZAC) 10 MG tablet   LAST APPT--08/19/22 NEXT APPT-- 12/16/22

## 2022-12-04 ENCOUNTER — Telehealth (HOSPITAL_COMMUNITY): Payer: Self-pay | Admitting: *Deleted

## 2022-12-04 MED ORDER — FLUOXETINE HCL 20 MG PO TABS: 40.0000 mg | ORAL_TABLET | Freq: Every day | ORAL | 0 refills | Status: DC

## 2022-12-04 NOTE — Telephone Encounter (Signed)
PATIENT REQUEST REFILL [facility] [id number] - [place], [place] - [address]    FLUoxetine (PROZAC) 20 MG tablet   NEXT APPT-- 12/16/22 LAST APPT--08/19/22

## 2022-12-04 NOTE — Addendum Note (Signed)
Addended by: Thresa Ross on: 12/04/2022 12:05 PM   Modules accepted: Orders

## 2022-12-16 ENCOUNTER — Ambulatory Visit (HOSPITAL_COMMUNITY): Payer: BC Managed Care – PPO | Admitting: Psychiatry

## 2022-12-19 ENCOUNTER — Other Ambulatory Visit (HOSPITAL_COMMUNITY): Payer: Self-pay | Admitting: Psychiatry

## 2022-12-19 DIAGNOSIS — F411 Generalized anxiety disorder: Secondary | ICD-10-CM

## 2023-01-06 ENCOUNTER — Ambulatory Visit (HOSPITAL_COMMUNITY): Payer: BC Managed Care – PPO | Admitting: Psychiatry

## 2023-01-12 DIAGNOSIS — L649 Androgenic alopecia, unspecified: Secondary | ICD-10-CM | POA: Diagnosis not present

## 2023-01-12 DIAGNOSIS — L309 Dermatitis, unspecified: Secondary | ICD-10-CM | POA: Diagnosis not present

## 2023-02-10 ENCOUNTER — Ambulatory Visit (HOSPITAL_COMMUNITY): Payer: BC Managed Care – PPO | Admitting: Psychiatry

## 2023-02-17 ENCOUNTER — Ambulatory Visit (HOSPITAL_COMMUNITY): Payer: BC Managed Care – PPO | Admitting: Psychiatry

## 2023-02-24 ENCOUNTER — Ambulatory Visit (HOSPITAL_COMMUNITY): Payer: BC Managed Care – PPO | Admitting: Psychiatry

## 2023-02-25 DIAGNOSIS — H5203 Hypermetropia, bilateral: Secondary | ICD-10-CM | POA: Diagnosis not present

## 2023-02-25 DIAGNOSIS — H04123 Dry eye syndrome of bilateral lacrimal glands: Secondary | ICD-10-CM | POA: Diagnosis not present

## 2023-02-25 DIAGNOSIS — H43393 Other vitreous opacities, bilateral: Secondary | ICD-10-CM | POA: Diagnosis not present

## 2023-02-25 DIAGNOSIS — H524 Presbyopia: Secondary | ICD-10-CM | POA: Diagnosis not present

## 2023-03-03 ENCOUNTER — Ambulatory Visit (INDEPENDENT_AMBULATORY_CARE_PROVIDER_SITE_OTHER): Payer: BC Managed Care – PPO | Admitting: Psychiatry

## 2023-03-03 VITALS — BP 125/86 | HR 81 | Ht 62.0 in | Wt 149.0 lb

## 2023-03-03 DIAGNOSIS — F411 Generalized anxiety disorder: Secondary | ICD-10-CM

## 2023-03-03 DIAGNOSIS — F429 Obsessive-compulsive disorder, unspecified: Secondary | ICD-10-CM | POA: Diagnosis not present

## 2023-03-03 DIAGNOSIS — Z9189 Other specified personal risk factors, not elsewhere classified: Secondary | ICD-10-CM | POA: Diagnosis not present

## 2023-03-03 MED ORDER — CLONAZEPAM 0.5 MG PO TABS: 0.5000 mg | ORAL_TABLET | Freq: Two times a day (BID) | ORAL | 0 refills | Status: DC | PRN

## 2023-03-03 NOTE — Progress Notes (Signed)
 [facility] Outpatient Follow up visit   Patient Identification: Frances Ho MRN:  [id number] Date of Evaluation:  03/03/2023 Referral Source: Primary care Chief Complaint:   Chief Complaint   Follow-up    follow up depression, anxiety Visit Diagnosis:   GAD Insomnia  amotivation      HPI:  53  years old currently single Caucasian female who is living with her mom. Referred initially by primary care physician for management of sleep, anxiety  and OCD  Gets obsessive with thoughts and worries,   Dad died, parents were separated but it effected her mom and she feels down Patient prefers to work from home but is not allowed for now  Klonopine helps with anxiety   Procrastinates but less intense  Aggravating factor:; finances, lonliness, brother sick  Modifying factor:  family  no psychosis     Past Medical History:  Past Medical History:  Diagnosis Date   Abnormal Pap smear of cervix 2009   Anxiety    Chronic headaches    Depression    Diastolic dysfunction 03/12/2016   Endometriosis    Genital warts    Heart murmur    Mitral valve prolapse    mild bileaflet MVP with mild MR on echo 12/2019   OCD (obsessive compulsive disorder)    OSA (obstructive sleep apnea) 07/30/2010   Tachycardia     Past Surgical History:  Procedure Laterality Date   COLPOSCOPY  3/09   w/ECC-neg   FOOT SURGERY  2005   left   PELVIC LAPAROSCOPY Bilateral 11/13/03   laparoscopic removal bilateral endometriomas    Family Psychiatric History: Mom : depression and anxiety  Family History:  Family History  Problem Relation Age of Onset   Skin cancer Mother    Irritable bowel syndrome Mother    Colon polyps Brother 67       colon polyps   Heart disease Maternal Grandmother    Heart disease Maternal Grandfather    Heart disease Paternal Grandmother    Diabetes Paternal Grandfather    Heart disease Paternal Grandfather    Diabetes Other        grandfather   Hypertension Other         grandfather/grandmother   Esophageal cancer Neg Hx    Pancreatic cancer Neg Hx    Stomach cancer Neg Hx     Social History:   Social History   Socioeconomic History   Marital status: Single    Spouse name: Not on file   Number of children: 0   Years of education: Not on file   Highest education level: Not on file  Occupational History   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    Social Drivers of Corporate Investment Banker Strain: Not on file  Food Insecurity: Not on file  Transportation Needs: Not on file  Physical Activity: Not on file  Stress: Not on file  Social Connections: Not on file     Allergies:  No Known Allergies  Metabolic Disorder Labs: Lab Results  Component Value Date   HGBA1C 5.2 08/11/2022   MPG 99.67 05/01/2020   No results found for: PROLACTIN Lab Results  Component Value Date   CHOL 229 (H) 08/11/2022   TRIG 107 08/11/2022   HDL 67 08/11/2022   CHOLHDL 3.4 08/11/2022   VLDL 63 (H) 05/01/2020   LDLCALC 143 (H) 08/11/2022   LDLCALC 138 (H) 07/25/2021     Current Medications: Current Outpatient Medications  Medication Sig Dispense Refill   B Complex Vitamins (B COMPLEX PO) Take 1 tablet by mouth daily.     BIOTIN PO Take 1,000 mg by mouth.     clonazePAM  (KLONOPIN ) 0.5 MG tablet Take 1 tablet (0.5 mg total) by mouth 2 (two) times daily as needed for anxiety. 30 tablet 0   DENTA 5000 PLUS 1.1 % CREA dental cream Take 1 application by mouth daily.     drospirenone -ethinyl estradiol  (NIKKI ) 3-0.02 MG tablet Take 1 tablet by mouth daily. 84 tablet 3   finasteride  (PROSCAR) 5 MG tablet Take 2.5 mg by mouth daily.     Fluocinolone Acetonide Body 0.01 % OIL Apply 1 application  topically 2 (two) times daily.     FLUoxetine  (PROZAC ) 10 MG tablet TAKE 1 TABLET BY MOUTH EVERY DAY 90 tablet 0   FLUoxetine  (PROZAC ) 20 MG tablet Take 2 tablets (40 mg total) by mouth daily. 180 tablet 0   GLUCOSA-CHONDR-NA CHONDR-MSM PO Take 1,500 mg by mouth daily.     Magnesium 100 MG CAPS Take 100 mg by mouth 2 (two) times daily.     Methylsulfonylmethane (MSM) 1000 MG CAPS Take 1 capsule by mouth daily.     Multiple Vitamin (MULTIVITAMIN) tablet Take 1 tablet by mouth daily.     No current facility-administered medications for this visit.      Psychiatric Specialty Exam: Review of Systems  Cardiovascular:  Negative for chest pain.  Psychiatric/Behavioral:  Negative for substance abuse and suicidal ideas.     There were no vitals taken for this visit.There is no height or weight on file to calculate BMI.  General Appearance: casual  Eye Contact:fair  Speech:  Normal Rate  Volume:  Decreased  Mood: fair  Affect:  congruent  Thought Process:  Goal Directed  Orientation:  Full (Time, Place, and Person)  Thought Content:  Rumination  Suicidal Thoughts:  No  Homicidal Thoughts:  No  Memory:  Immediate;   Fair Recent;   Fair  Judgement:  Fair  Insight:  Fair  Psychomotor Activity:  Decreased  Concentration:  Concentration: Fair and Attention Span: Fair  Recall:  [employer] of Knowledge:Fair  Language: Fair  Akathisia:  Negative  Handed:  Right  AIMS (if indicated):    Assets:  Desire for Improvement  ADL's:  Intact  Cognition: WNL  Sleep:  poor    Treatment Plan Summary: Medication management and Plan as follows     Prior documentation reviewed   GAD and panic symptoms: managing it, helps mom out but gets stressed, continue prozac   OCD:  basleline, contine prozac    Depression: manageable, provided supportive and grief therapy, continue prozac    Working from home may work but not allowed yet    Direct care time spent in office 18 minutes  including chart review and face to face Fu 80m.    Jackey Flight, MD 2/4/20254:40 PM

## 2023-03-09 ENCOUNTER — Other Ambulatory Visit (HOSPITAL_COMMUNITY): Payer: Self-pay | Admitting: *Deleted

## 2023-03-09 ENCOUNTER — Telehealth (HOSPITAL_COMMUNITY): Payer: Self-pay | Admitting: *Deleted

## 2023-03-09 MED ORDER — FLUOXETINE HCL 20 MG PO TABS: 40.0000 mg | ORAL_TABLET | Freq: Every day | ORAL | 0 refills | Status: DC

## 2023-03-09 NOTE — Telephone Encounter (Signed)
 PATIENT REQUEST REFILL [facility] [id number] - [place], [place] - [address]     FLUoxetine  (PROZAC ) 20 MG tablet    NEXT APPT-- 06/30/23 LAST APPT--03/03/23

## 2023-03-09 NOTE — Telephone Encounter (Signed)
 Sent, Co Sign

## 2023-03-15 DIAGNOSIS — R519 Headache, unspecified: Secondary | ICD-10-CM | POA: Diagnosis not present

## 2023-03-15 DIAGNOSIS — J101 Influenza due to other identified influenza virus with other respiratory manifestations: Secondary | ICD-10-CM | POA: Diagnosis not present

## 2023-03-15 DIAGNOSIS — R051 Acute cough: Secondary | ICD-10-CM | POA: Diagnosis not present

## 2023-03-19 ENCOUNTER — Other Ambulatory Visit (HOSPITAL_COMMUNITY): Payer: Self-pay | Admitting: *Deleted

## 2023-03-19 ENCOUNTER — Telehealth (HOSPITAL_COMMUNITY): Payer: Self-pay | Admitting: *Deleted

## 2023-03-19 DIAGNOSIS — F411 Generalized anxiety disorder: Secondary | ICD-10-CM

## 2023-03-19 MED ORDER — FLUOXETINE HCL 10 MG PO TABS
10.0000 mg | ORAL_TABLET | Freq: Every day | ORAL | 0 refills | Status: DC
Start: 2023-03-19 — End: 2023-06-09

## 2023-03-19 NOTE — Telephone Encounter (Signed)
 PROVIDER AUTHORIZED REFILL FLUoxetine (PROZAC) 10 MG tablet -- CO SIGN

## 2023-03-19 NOTE — Telephone Encounter (Signed)
 Rx Refill Request -- [id number] [place], [place] - [address]    FLUoxetine FLUoxetine (PROZAC) 10 MG tablet LAST FILL DATE:  12/19/22  NEXT APPT   06/30/23 LAST APPT     03/03/23

## 2023-04-22 ENCOUNTER — Telehealth: Payer: Self-pay | Admitting: General Practice

## 2023-04-22 NOTE — Telephone Encounter (Signed)
 Copied from CRM [id number]. Topic: General - Other >> Apr 22, 2023  4:31 PM Almira Coaster wrote: Reason for CRM: Patient was referred to establish care with Dr.Copland, advised patient Dr.Copland was not accepting new patients. She would like to know since she is being referred by Dr.Mary Leda Quail an OBGYN in [facility] health is Dr.Copland would make an exception in seeing her.

## 2023-04-23 NOTE — Telephone Encounter (Signed)
 Okay to schedule

## 2023-04-24 NOTE — Telephone Encounter (Signed)
Lvm 2 schedule.

## 2023-05-11 DIAGNOSIS — R070 Pain in throat: Secondary | ICD-10-CM | POA: Diagnosis not present

## 2023-05-11 DIAGNOSIS — R0981 Nasal congestion: Secondary | ICD-10-CM | POA: Diagnosis not present

## 2023-05-11 DIAGNOSIS — U071 COVID-19: Secondary | ICD-10-CM | POA: Diagnosis not present

## 2023-05-11 DIAGNOSIS — R509 Fever, unspecified: Secondary | ICD-10-CM | POA: Diagnosis not present

## 2023-05-11 DIAGNOSIS — J029 Acute pharyngitis, unspecified: Secondary | ICD-10-CM | POA: Diagnosis not present

## 2023-05-14 ENCOUNTER — Ambulatory Visit: Admitting: Family Medicine

## 2023-05-26 NOTE — Progress Notes (Addendum)
 [facility] [address], [address] [place], [place] [address] [phone]  Date:  05/28/2023   Name:  Frances Ho   DOB:  1970-08-10   MRN:  [id number]  PCP:  Kaylee Partridge, MD    Chief Complaint: Establish Care (Pt would like to check immunity levels checked /Pt has had COVID and Flu 2 months apart /Pt would also like to discuss weight loss )   History of Present Illness:  Frances Ho is a 53 y.o. very pleasant female patient who presents with the following:  Seen today as a new patient to establish care- referred by Dr Annabell Key  History of sleep apnea, used to use CPAP but has gotten away from using it More recently her pulmonologist dx with her hypersomnia but did not think she needs to use CPAP She notes she may sleep 6-7 hours a night, then she may wake up still feeling tired   She has MVP- she gets an echo every couple of years   She is from [place] [employer]  Dr Annabell Key GYN Dr Linder Revere pulmonology Dr Micael Adas cardiology She sees derm for alopecia and lichen planus   Her brother has throat cancer Her father had colon cancer- he did pass away in December.    He was dx at about age 74  Fluoxetine  for anxiety and depression  Patient is concerned about "weight gain" it sounds like she is mostly concerned that her lower abdomen seems more protuberant than before.  She wonders if she can get medication for weight loss.  I advised her her weight is just minimally over ideal, I would not prescribe weight loss medication in her case.  We can certainly check a pelvic ultrasound  She also notes she had both COVID and flu this spring season.  She wonders if this means she has an immune problem because she does not usually get sick Of note she was vaccinated against neither     Patient Active Problem List   Diagnosis Date Noted   Mitral valve prolapse 10/22/2017   Panic attacks 05/14/2017   Diastolic  dysfunction 03/12/2016   Alopecia 11/02/2015   Mixed obsessional thoughts and acts 11/02/2015   Mitral regurgitation 12/20/2013   Endometriosis 08/09/2013   Hypersomnia 03/17/2011   OSA (obstructive sleep apnea) 07/30/2010   Dyspnea 07/03/2010   Allergic rhinitis, seasonal 07/03/2010   Postconcussion syndrome 03/20/2010   Headache 10/26/2009   Headache 10/26/2009    Past Medical History:  Diagnosis Date   Abnormal Pap smear of cervix 2009   Anxiety    Chronic headaches    Depression    Diastolic dysfunction 03/12/2016   Endometriosis    Genital warts    Heart murmur    Mitral valve prolapse    mild bileaflet MVP with mild MR on echo 12/2019   OCD (obsessive compulsive disorder)    OSA (obstructive sleep apnea) 07/30/2010   Tachycardia     Past Surgical History:  Procedure Laterality Date   COLPOSCOPY  3/09   w/ECC-neg   FOOT SURGERY  2005   left   PELVIC LAPAROSCOPY Bilateral 11/13/03   laparoscopic removal bilateral endometriomas    Social History   Tobacco Use   Smoking status: Never   Smokeless tobacco: Never  Vaping Use   Vaping status: Never Used  Substance Use Topics   Alcohol use: No   Drug use: No    Family  History  Problem Relation Age of Onset   Skin cancer Mother    Irritable bowel syndrome Mother    Colon polyps Brother 56       colon polyps   Heart disease Maternal Grandmother    Heart disease Maternal Grandfather    Heart disease Paternal Grandmother    Diabetes Paternal Grandfather    Heart disease Paternal Grandfather    Diabetes Other        grandfather   Hypertension Other        grandfather/grandmother   Esophageal cancer Neg Hx    Pancreatic cancer Neg Hx    Stomach cancer Neg Hx     No Known Allergies  Medication list has been reviewed and updated.  Current Outpatient Medications on File Prior to Visit  Medication Sig Dispense Refill   Apple Cider Vinegar 188 MG CAPS Take by mouth. 100 mg     B Complex Vitamins (B  COMPLEX PO) Take 1 tablet by mouth daily.     BIOTIN PO Take 1,000 mg by mouth.     clonazePAM  (KLONOPIN ) 0.5 MG tablet Take 1 tablet (0.5 mg total) by mouth 2 (two) times daily as needed for anxiety. 30 tablet 0   DENTA 5000 PLUS 1.1 % CREA dental cream Take 1 application by mouth daily.     drospirenone -ethinyl estradiol  (NIKKI ) 3-0.02 MG tablet Take 1 tablet by mouth daily. 84 tablet 3   finasteride (PROSCAR) 5 MG tablet Take 2.5 mg by mouth daily.     Fluocinolone Acetonide Body 0.01 % OIL Apply 1 application  topically 2 (two) times daily.     FLUoxetine  (PROZAC ) 10 MG tablet Take 1 tablet (10 mg total) by mouth daily. 90 tablet 0   FLUoxetine  (PROZAC ) 20 MG tablet Take 2 tablets (40 mg total) by mouth daily. 180 tablet 0   GLUCOSA-CHONDR-NA CHONDR-MSM PO Take 1,500 mg by mouth daily.     Magnesium 100 MG CAPS Take 100 mg by mouth 2 (two) times daily.     Methylsulfonylmethane (MSM) 1000 MG CAPS Take 1 capsule by mouth daily.     Multiple Vitamin (MULTIVITAMIN) tablet Take 1 tablet by mouth daily.     No current facility-administered medications on file prior to visit.    Review of Systems:  As per HPI- otherwise negative.   Physical Examination: Vitals:   05/28/23 1527  BP: 120/72  Pulse: 84  SpO2: 98%   Vitals:   05/28/23 1527  Weight: 147 lb (66.7 kg)  Height: 5\' 2"  (1.575 m)   Body mass index is 26.89 kg/m. Ideal Body Weight: Weight in (lb) to have BMI = 25: 136.4  GEN: no acute distress.  Mildly overweight, looks well HEENT: Atraumatic, Normocephalic.  Ears and Nose: No external deformity. CV: RRR, No M/G/R. No JVD. No thrill. No extra heart sounds. PULM: CTA B, no wheezes, crackles, rhonchi. No retractions. No resp. distress. No accessory muscle use. ABD: S, NT, ND, +BS. No rebound. No HSM.  Some lower abdominal fullness, likely due to body habitus.  I cannot palpate any particular mass EXTR: No c/c/e PSYCH: Normally interactive. Conversant.    Assessment  and Plan: Screening for diabetes mellitus - Plan: Comprehensive metabolic panel with GFR, Hemoglobin A1c  Thyroid  disorder screening - Plan: TSH  Screening for deficiency anemia - Plan: CBC  Screening, lipid - Plan: Lipid panel  Abdominal distention - Plan: US  Pelvic Complete With Transvaginal  I have messaged her gastroenterologist about new family history of  colon cancer Routine lab screening as above Will check her CBC as she is concerned about her immunity function Set up pelvic ultrasound to evaluate for any uterine distention  Signed Gates Kasal, MD  Received labs- message to pt 5/2  Results for orders placed or performed in visit on 05/28/23  CBC   Collection Time: 05/28/23  4:10 PM  Result Value Ref Range   WBC 5.4 4.0 - 10.5 K/uL   RBC 4.27 3.87 - 5.11 Mil/uL   Platelets 208.0 150.0 - 400.0 K/uL   Hemoglobin 13.1 12.0 - 15.0 g/dL   HCT 40.9 81.1 - 91.4 %   MCV 92.3 78.0 - 100.0 fl   MCHC 33.3 30.0 - 36.0 g/dL   RDW 78.2 95.6 - 21.3 %  Comprehensive metabolic panel with GFR   Collection Time: 05/28/23  4:10 PM  Result Value Ref Range   Sodium 136 135 - 145 mEq/L   Potassium 3.8 3.5 - 5.1 mEq/L   Chloride 101 96 - 112 mEq/L   CO2 25 19 - 32 mEq/L   Glucose, Bld 80 70 - 99 mg/dL   BUN 13 6 - 23 mg/dL   Creatinine, Ser 0.86 0.40 - 1.20 mg/dL   Total Bilirubin 0.4 0.2 - 1.2 mg/dL   Alkaline Phosphatase 91 39 - 117 U/L   AST 24 0 - 37 U/L   ALT 19 0 - 35 U/L   Total Protein 8.0 6.0 - 8.3 g/dL   Albumin 4.2 3.5 - 5.2 g/dL   GFR 57.84 >69.62 mL/min   Calcium 9.4 8.4 - 10.5 mg/dL  Hemoglobin X5M   Collection Time: 05/28/23  4:10 PM  Result Value Ref Range   Hgb A1c MFr Bld 5.3 4.6 - 6.5 %  Lipid panel   Collection Time: 05/28/23  4:10 PM  Result Value Ref Range   Cholesterol 214 (H) 0 - 200 mg/dL   Triglycerides 841.3 (H) 0.0 - 149.0 mg/dL   HDL 24.40 >10.27 mg/dL   VLDL 25.3 (H) 0.0 - 66.4 mg/dL   LDL Cholesterol 403 (H) 0 - 99 mg/dL   Total  CHOL/HDL Ratio 3    NonHDL 151.44   TSH   Collection Time: 05/28/23  4:10 PM  Result Value Ref Range   TSH 2.33 0.35 - 5.50 uIU/mL

## 2023-05-28 ENCOUNTER — Ambulatory Visit (HOSPITAL_BASED_OUTPATIENT_CLINIC_OR_DEPARTMENT_OTHER)

## 2023-05-28 ENCOUNTER — Ambulatory Visit (INDEPENDENT_AMBULATORY_CARE_PROVIDER_SITE_OTHER): Admitting: Family Medicine

## 2023-05-28 VITALS — BP 120/72 | HR 84 | Ht 62.0 in | Wt 147.0 lb

## 2023-05-28 DIAGNOSIS — Z131 Encounter for screening for diabetes mellitus: Secondary | ICD-10-CM | POA: Diagnosis not present

## 2023-05-28 DIAGNOSIS — Z13220 Encounter for screening for lipoid disorders: Secondary | ICD-10-CM | POA: Diagnosis not present

## 2023-05-28 DIAGNOSIS — Z130 Encounter for screening for diseases of the blood and blood-forming organs and certain disorders involving the immune mechanism: Secondary | ICD-10-CM | POA: Diagnosis not present

## 2023-05-28 DIAGNOSIS — R140 Abdominal distension (gaseous): Secondary | ICD-10-CM

## 2023-05-28 DIAGNOSIS — Z1329 Encounter for screening for other suspected endocrine disorder: Secondary | ICD-10-CM

## 2023-05-28 NOTE — Patient Instructions (Addendum)
 It was good to see you today I will be in touch with your labs asap We will also check a pelvic US  - stop by imaging on the ground floor to see if they can do this now

## 2023-05-29 ENCOUNTER — Encounter: Payer: Self-pay | Admitting: Family Medicine

## 2023-05-29 ENCOUNTER — Encounter (HOSPITAL_BASED_OUTPATIENT_CLINIC_OR_DEPARTMENT_OTHER): Payer: Self-pay

## 2023-05-29 ENCOUNTER — Ambulatory Visit (HOSPITAL_BASED_OUTPATIENT_CLINIC_OR_DEPARTMENT_OTHER)
Admission: RE | Admit: 2023-05-29 | Discharge: 2023-05-29 | Disposition: A | Discharge status: Home / Self Care | Source: Ambulatory Visit | Attending: Family Medicine | Admitting: Family Medicine

## 2023-05-29 DIAGNOSIS — R140 Abdominal distension (gaseous): Secondary | ICD-10-CM

## 2023-05-29 LAB — CBC
HCT: 39.4 % (ref 36.0–46.0)
Hemoglobin: 13.1 g/dL (ref 12.0–15.0)
MCHC: 33.3 g/dL (ref 30.0–36.0)
MCV: 92.3 fl (ref 78.0–100.0)
Platelets: 208 10*3/uL (ref 150.0–400.0)
RBC: 4.27 Mil/uL (ref 3.87–5.11)
RDW: 12.9 % (ref 11.5–15.5)
WBC: 5.4 10*3/uL (ref 4.0–10.5)

## 2023-05-29 LAB — TSH: TSH: 2.33 u[IU]/mL (ref 0.35–5.50)

## 2023-05-29 LAB — COMPREHENSIVE METABOLIC PANEL WITH GFR
ALT: 19 U/L (ref 0–35)
AST: 24 U/L (ref 0–37)
Albumin: 4.2 g/dL (ref 3.5–5.2)
Alkaline Phosphatase: 91 U/L (ref 39–117)
BUN: 13 mg/dL (ref 6–23)
CO2: 25 meq/L (ref 19–32)
Calcium: 9.4 mg/dL (ref 8.4–10.5)
Chloride: 101 meq/L (ref 96–112)
Creatinine, Ser: 0.96 mg/dL (ref 0.40–1.20)
GFR: 68.23 mL/min (ref >60.00)
Glucose, Bld: 80 mg/dL (ref 70–99)
Potassium: 3.8 meq/L (ref 3.5–5.1)
Sodium: 136 meq/L (ref 135–145)
Total Bilirubin: 0.4 mg/dL (ref 0.2–1.2)
Total Protein: 8 g/dL (ref 6.0–8.3)

## 2023-05-29 LAB — LIPID PANEL
Cholesterol: 214 mg/dL — ABNORMAL HIGH (ref 0–200)
HDL: 62.9 mg/dL (ref >39.00)
LDL Cholesterol: 107 mg/dL — ABNORMAL HIGH (ref 0–99)
NonHDL: 151.44
Total CHOL/HDL Ratio: 3
Triglycerides: 221 mg/dL — ABNORMAL HIGH (ref 0.0–149.0)
VLDL: 44.2 mg/dL — ABNORMAL HIGH (ref 0.0–40.0)

## 2023-05-29 LAB — HEMOGLOBIN A1C: Hgb A1c MFr Bld: 5.3 % (ref 4.6–6.5)

## 2023-06-01 ENCOUNTER — Ambulatory Visit

## 2023-06-01 ENCOUNTER — Other Ambulatory Visit: Payer: Self-pay | Admitting: Family Medicine

## 2023-06-01 DIAGNOSIS — R140 Abdominal distension (gaseous): Secondary | ICD-10-CM

## 2023-06-01 DIAGNOSIS — Z130 Encounter for screening for diseases of the blood and blood-forming organs and certain disorders involving the immune mechanism: Secondary | ICD-10-CM

## 2023-06-01 DIAGNOSIS — Z13220 Encounter for screening for lipoid disorders: Secondary | ICD-10-CM

## 2023-06-01 DIAGNOSIS — Z131 Encounter for screening for diabetes mellitus: Secondary | ICD-10-CM

## 2023-06-01 DIAGNOSIS — R635 Abnormal weight gain: Secondary | ICD-10-CM

## 2023-06-01 DIAGNOSIS — Z1329 Encounter for screening for other suspected endocrine disorder: Secondary | ICD-10-CM

## 2023-06-04 ENCOUNTER — Encounter: Payer: Self-pay | Admitting: Family Medicine

## 2023-06-09 ENCOUNTER — Other Ambulatory Visit (HOSPITAL_COMMUNITY): Payer: Self-pay | Admitting: *Deleted

## 2023-06-09 ENCOUNTER — Telehealth (HOSPITAL_COMMUNITY): Payer: Self-pay | Admitting: *Deleted

## 2023-06-09 DIAGNOSIS — F411 Generalized anxiety disorder: Secondary | ICD-10-CM

## 2023-06-09 MED ORDER — FLUOXETINE HCL 10 MG PO TABS: 10.0000 mg | ORAL_TABLET | Freq: Every day | ORAL | 0 refills | Status: DC

## 2023-06-09 MED ORDER — FLUOXETINE HCL 20 MG PO TABS: 40.0000 mg | ORAL_TABLET | Freq: Every day | ORAL | 0 refills | Status: DC

## 2023-06-09 NOTE — Telephone Encounter (Signed)
 FLUoxetine  (PROZAC ) 20 MG tablet  FLUoxetine  (PROZAC ) 10 MG tablet   [facility] [id number] - [place], [place] - [address]      NEXT APPT   06/30/23 LAST APPT     03/03/23  Sent, Co Sign

## 2023-06-10 DIAGNOSIS — Z85820 Personal history of malignant melanoma of skin: Secondary | ICD-10-CM | POA: Diagnosis not present

## 2023-06-10 DIAGNOSIS — L578 Other skin changes due to chronic exposure to nonionizing radiation: Secondary | ICD-10-CM | POA: Diagnosis not present

## 2023-06-16 ENCOUNTER — Other Ambulatory Visit: Payer: Self-pay | Admitting: Obstetrics & Gynecology

## 2023-06-16 DIAGNOSIS — Z1231 Encounter for screening mammogram for malignant neoplasm of breast: Secondary | ICD-10-CM

## 2023-06-22 ENCOUNTER — Other Ambulatory Visit (HOSPITAL_COMMUNITY): Payer: Self-pay | Admitting: Psychiatry

## 2023-06-22 DIAGNOSIS — F411 Generalized anxiety disorder: Secondary | ICD-10-CM

## 2023-06-30 ENCOUNTER — Ambulatory Visit (HOSPITAL_COMMUNITY): Payer: BC Managed Care – PPO | Admitting: Psychiatry

## 2023-07-17 ENCOUNTER — Ambulatory Visit

## 2023-07-21 ENCOUNTER — Ambulatory Visit (HOSPITAL_COMMUNITY): Admitting: Psychiatry

## 2023-07-30 ENCOUNTER — Ambulatory Visit

## 2023-07-30 ENCOUNTER — Ambulatory Visit
Admission: RE | Admit: 2023-07-30 | Discharge: 2023-07-30 | Disposition: A | Discharge status: Home / Self Care | Payer: Self-pay | Source: Ambulatory Visit | Attending: Obstetrics & Gynecology | Admitting: Obstetrics & Gynecology

## 2023-07-30 DIAGNOSIS — Z1231 Encounter for screening mammogram for malignant neoplasm of breast: Secondary | ICD-10-CM

## 2023-08-03 ENCOUNTER — Ambulatory Visit: Payer: Self-pay | Attending: Cardiology | Admitting: Cardiology

## 2023-08-03 ENCOUNTER — Encounter: Payer: Self-pay | Admitting: Cardiology

## 2023-08-03 VITALS — BP 118/72 | HR 90 | Ht 62.0 in | Wt 144.2 lb

## 2023-08-03 DIAGNOSIS — I341 Nonrheumatic mitral (valve) prolapse: Secondary | ICD-10-CM | POA: Diagnosis not present

## 2023-08-03 DIAGNOSIS — R0789 Other chest pain: Secondary | ICD-10-CM

## 2023-08-03 NOTE — Patient Instructions (Addendum)
 Medication Instructions:  Your physician recommends that you continue on your current medications as directed. Please refer to the Current Medication list given to you today.  *If you need a refill on your cardiac medications before your next appointment, please call your pharmacy*  Lab Work: None.  If you have labs (blood work) drawn today and your tests are completely normal, you will receive your results only by: MyChart Message (if you have MyChart) OR A paper copy in the mail If you have any lab test that is abnormal or we need to change your treatment, we will call you to review the results.  Testing/Procedures: Your physician has requested that you have an echocardiogram. Echocardiography is a painless test that uses sound waves to create images of your heart. It provides your doctor with information about the size and shape of your heart and how well your heart's chambers and valves are working. This procedure takes approximately one hour. There are no restrictions for this procedure. Please do NOT wear cologne, perfume, aftershave, or lotions (deodorant is allowed). Please arrive 15 minutes prior to your appointment time.  Please note: We ask at that you not bring children with you during ultrasound (echo/ vascular) testing. Due to room size and safety concerns, children are not allowed in the ultrasound rooms during exams. Our front office staff cannot provide observation of children in our lobby area while testing is being conducted. An adult accompanying a patient to their appointment will only be allowed in the ultrasound room at the discretion of the ultrasound technician under special circumstances. We apologize for any inconvenience.   Follow-Up: At [facility], you and your health needs are our priority.  As part of our continuing mission to provide you with exceptional heart care, our providers are all part of one team.  This team includes your primary Cardiologist  (physician) and Advanced Practice Providers or APPs (Physician Assistants and Nurse Practitioners) who all work together to provide you with the care you need, when you need it.  Your next appointment:   1 year(s)  Provider:   Wilbert Bihari, MD

## 2023-08-03 NOTE — Progress Notes (Addendum)
 Cardiology Consult Note:    Date:  08/03/2023   ID:  Frances Ho, DOB Jul 20, 1970, MRN [id number]  PCP:  Watt Harlene BROCKS, MD  Cardiologist:  Wilbert Bihari, MD    Referring MD: Watt Harlene BROCKS, MD   Chief Complaint  Patient presents with   Mitral Regurgitation    History of Present Illness:    Frances Ho is a 53 y.o. 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  Had a sleep study in 2023 for excessive daytime sleepiness which showed no obstructive sleep apnea.  She was lost to follow-up with last office visit in 2021.  She is now referred back by her PCP Harlene Buzzard, MD for cardiology consult to follow-up on her mitral valve prolapse with MR.   She denies any chest pain or pressure, SOB, DOE, PND, orthopnea, LE edema, dizziness, palpitations or syncope. She is compliant with her meds and is tolerating meds with no SE.    Past Medical History:  Diagnosis Date   Abnormal Pap smear of cervix 2009   Anxiety    Chronic headaches    Depression    Diastolic dysfunction 03/12/2016   Endometriosis    Genital warts    Heart murmur    Mitral valve prolapse    mild bileaflet MVP with mild MR on echo 12/2019   OCD (obsessive compulsive disorder)    OSA (obstructive sleep apnea) 07/30/2010   Tachycardia     Past Surgical History:  Procedure Laterality Date   COLPOSCOPY  3/09   w/ECC-neg   FOOT SURGERY  2005   left   PELVIC LAPAROSCOPY Bilateral 11/13/03   laparoscopic removal bilateral endometriomas    Current Medications: Current Meds  Medication Sig   Apple Cider Vinegar 188 MG CAPS Take by mouth. 100 mg   B Complex Vitamins (B COMPLEX PO) Take 1 tablet by mouth daily.   BIOTIN PO Take 1,000 mg by mouth.   clonazePAM  (KLONOPIN ) 0.5 MG tablet Take 1 tablet (0.5 mg total) by mouth 2 (two) times daily as needed for anxiety.   DENTA 5000 PLUS 1.1 % CREA dental cream Take 1 application by mouth daily.   drospirenone -ethinyl estradiol  (NIKKI ) 3-0.02 MG  tablet Take 1 tablet by mouth daily.   finasteride (PROSCAR) 5 MG tablet Take 2.5 mg by mouth daily.   Fluocinolone Acetonide Body 0.01 % OIL Apply 1 application  topically 2 (two) times daily.   FLUoxetine  (PROZAC ) 10 MG tablet TAKE 1 TABLET BY MOUTH EVERY DAY   FLUoxetine  (PROZAC ) 20 MG tablet Take 2 tablets (40 mg total) by mouth daily.   GLUCOSA-CHONDR-NA CHONDR-MSM PO Take 1,500 mg by mouth daily.   Magnesium 100 MG CAPS Take 100 mg by mouth 2 (two) times daily.   Methylsulfonylmethane (MSM) 1000 MG CAPS Take 1 capsule by mouth daily.   Multiple Vitamin (MULTIVITAMIN) tablet Take 1 tablet by mouth daily.     Allergies:   Patient has no known allergies.   Social History   Socioeconomic History   Marital status: Single    Spouse name: Not on file   Number of children: 0   Years of education: Not on file   Highest education level: Not on file  Occupational History   Occupation: clerical at [employer]  Tobacco Use   Smoking status: Never   Smokeless tobacco: Never  Vaping Use   Vaping status: Never Used  Substance and Sexual Activity   Alcohol use: No  Drug use: No   Sexual activity: Not Currently    Partners: Male    Birth control/protection: Pill    Comment: yaz  Other Topics Concern   Not on file  Social History Narrative   Pt is single and lives with family.    Social Drivers of Corporate investment banker Strain: Not on file  Food Insecurity: Not on file  Transportation Needs: Not on file  Physical Activity: Not on file  Stress: Not on file  Social Connections: Not on file     Family History: The patient's family history includes Colon cancer in her father; Colon polyps (age of onset: 59) in her brother; Diabetes in her paternal grandfather and another family member; Heart disease in her maternal grandfather, maternal grandmother, paternal grandfather, and paternal grandmother; Hypertension in an other family member; Irritable bowel syndrome in her mother;  Skin cancer in her mother. There is no history of Esophageal cancer, Pancreatic cancer, or Stomach cancer.  ROS:   Please see the history of present illness.    ROS  All other systems reviewed and negative.   EKGs/Labs/Other Studies Reviewed:    The following studies were reviewed today: EKG Interpretation Date/Time:  Monday August 03 2023 14:52:11 EDT Ventricular Rate:  90 PR Interval:  114 QRS Duration:  74 QT Interval:  376 QTC Calculation: 459 R Axis:   56  Text Interpretation: Normal sinus rhythm Normal ECG When compared with ECG of 30-Apr-2015 17:02, PREVIOUS ECG IS PRESENT Confirmed by Shlomo Corning [phone]) on 08/03/2023 2:56:26 PM    Recent Labs: 05/28/2023: ALT 19; BUN 13; Creatinine, Ser 0.96; Hemoglobin 13.1; Platelets 208.0; Potassium 3.8; Sodium 136; TSH 2.33   Recent Lipid Panel    Component Value Date/Time   CHOL 214 (H) 05/28/2023 1610   CHOL 229 (H) 08/11/2022 1511   TRIG 221.0 (H) 05/28/2023 1610   HDL 62.90 05/28/2023 1610   HDL 67 08/11/2022 1511   CHOLHDL 3 05/28/2023 1610   VLDL 44.2 (H) 05/28/2023 1610   LDLCALC 107 (H) 05/28/2023 1610   LDLCALC 143 (H) 08/11/2022 1511    Physical Exam:    VS:  BP 118/72   Pulse 90   Ht 5' 2 (1.575 m)   Wt 144 lb 3.2 oz (65.4 kg)   SpO2 99%   BMI 26.37 kg/m     Wt Readings from Last 3 Encounters:  08/03/23 144 lb 3.2 oz (65.4 kg)  05/28/23 147 lb (66.7 kg)  08/11/22 147 lb 9.6 oz (67 kg)   GEN: Well nourished, well developed in no acute distress HEENT: Normal NECK: No JVD; No carotid bruits LYMPHATICS: No lymphadenopathy CARDIAC:RRR, no murmurs, rubs, gallops RESPIRATORY:  Clear to auscultation without rales, wheezing or rhonchi  ABDOMEN: Soft, non-tender, non-distended MUSCULOSKELETAL:  No edema; No deformity  SKIN: Warm and dry NEUROLOGIC:  Alert and oriented x 3 PSYCHIATRIC:  Normal affect  ASSESSMENT:    1. Mitral valve prolapse   2. Other chest pain     PLAN:    In order of problems listed  above:  1.  MVP with MR -echo 2019 showed bileaflet MVP with trivial MR -repeat echo 2023 with EF 60 to 65% with G1 DD mild to moderate MR with mild late systolic MVP of both mitral valve leaflets - Repeat 2D echo   2.  Chest pain -ETT showed no ischemia in 2018 and coronary Ca score 12/2018 was 0 -she has had what sounds like MSK pain that is reproducible  with palpation over the sternum in the past -Denies any anginal chest pain  Followup with me in 1 year  Medication Adjustments/Labs and Tests Ordered: Current medicines are reviewed at length with the patient today.  Concerns regarding medicines are outlined above.  Orders Placed This Encounter  Procedures   EKG 12-Lead   No orders of the defined types were placed in this encounter.   Signed, Wilbert Bihari, MD  08/03/2023 2:56 PM    [facility]

## 2023-08-03 NOTE — Addendum Note (Signed)
 Addended by: JANIT GENI CROME on: 08/03/2023 03:10 PM   Modules accepted: Orders

## 2023-09-02 ENCOUNTER — Encounter (HOSPITAL_BASED_OUTPATIENT_CLINIC_OR_DEPARTMENT_OTHER): Payer: Self-pay

## 2023-09-03 ENCOUNTER — Encounter: Payer: Self-pay | Admitting: Cardiology

## 2023-09-03 ENCOUNTER — Ambulatory Visit (HOSPITAL_BASED_OUTPATIENT_CLINIC_OR_DEPARTMENT_OTHER)

## 2023-09-03 ENCOUNTER — Ambulatory Visit: Payer: Self-pay | Admitting: Cardiology

## 2023-09-03 DIAGNOSIS — I341 Nonrheumatic mitral (valve) prolapse: Secondary | ICD-10-CM | POA: Diagnosis not present

## 2023-09-03 DIAGNOSIS — R0789 Other chest pain: Secondary | ICD-10-CM | POA: Diagnosis not present

## 2023-09-03 DIAGNOSIS — I340 Nonrheumatic mitral (valve) insufficiency: Secondary | ICD-10-CM

## 2023-09-03 LAB — ECHOCARDIOGRAM COMPLETE
Area-P 1/2: 4.06 cm2
MV M vel: 5.04 m/s
MV Peak grad: 101.6 mmHg
Radius: 0.5 cm
S' Lateral: 1.7 cm

## 2023-09-04 NOTE — Telephone Encounter (Signed)
 Call to patient to discuss echo results. Left detailed message per DPR advising:  2D echo showed normal pumping function of the heart with EF 60 to 65%, moderately leaky mitral valve due to bileaflet mitral valve prolapse.  Compared to echo 2023 there is just been a slight  increase in degree of mitral regurgitation.     Advised Dr. Shlomo would like to repeat 2D echo in 1 year for mitral valve prolapse with MR, order placed. Asked patient to call our office if any questions.

## 2023-09-04 NOTE — Telephone Encounter (Signed)
-----   Message from Wilbert Bihari sent at 09/03/2023  6:09 PM EDT ----- 2D echo showed normal pumping function of the heart with EF 60 to 65%, moderately leaky mitral valve due to bileaflet mitral valve prolapse.  Compared to echo 2023 there is just been a slight  increase in degree of mitral regurgitation.  Please repeat 2D echo in 1 year for mitral valve prolapse with MR ----- Message ----- From: Interface, [employer] Sent: 09/03/2023   5:32 PM EDT To: Wilbert JONELLE Bihari, MD

## 2023-09-05 ENCOUNTER — Other Ambulatory Visit (HOSPITAL_BASED_OUTPATIENT_CLINIC_OR_DEPARTMENT_OTHER): Payer: Self-pay | Admitting: Obstetrics & Gynecology

## 2023-09-05 DIAGNOSIS — N809 Endometriosis, unspecified: Secondary | ICD-10-CM

## 2023-09-07 ENCOUNTER — Telehealth (INDEPENDENT_AMBULATORY_CARE_PROVIDER_SITE_OTHER): Admitting: Psychiatry

## 2023-09-07 ENCOUNTER — Encounter (HOSPITAL_COMMUNITY): Payer: Self-pay | Admitting: Psychiatry

## 2023-09-07 DIAGNOSIS — F429 Obsessive-compulsive disorder, unspecified: Secondary | ICD-10-CM

## 2023-09-07 DIAGNOSIS — F32A Depression, unspecified: Secondary | ICD-10-CM

## 2023-09-07 DIAGNOSIS — F411 Generalized anxiety disorder: Secondary | ICD-10-CM | POA: Diagnosis not present

## 2023-09-07 DIAGNOSIS — Z9189 Other specified personal risk factors, not elsewhere classified: Secondary | ICD-10-CM

## 2023-09-07 MED ORDER — CLONAZEPAM 0.5 MG PO TABS: 0.5000 mg | ORAL_TABLET | Freq: Two times a day (BID) | ORAL | 0 refills | Status: AC | PRN

## 2023-09-07 MED ORDER — FLUOXETINE HCL 10 MG PO TABS
10.0000 mg | ORAL_TABLET | Freq: Every day | ORAL | 0 refills | Status: DC
Start: 2023-09-07 — End: 2023-09-24

## 2023-09-07 MED ORDER — FLUOXETINE HCL 20 MG PO TABS: 40.0000 mg | ORAL_TABLET | Freq: Every day | ORAL | 0 refills | Status: DC

## 2023-09-07 NOTE — Progress Notes (Signed)
 [facility] Outpatient Follow up visit   Patient Identification: FREDERICK KLINGER MRN:  [id number] Date of Evaluation:  09/07/2023 Referral Source: Primary care Chief Complaint:     follow up depression, anxiety Visit Diagnosis:   GAD Insomnia  Virtual Visit via Video Note  I connected with Rylah Fukuda Lundeen on 09/07/23 at  4:30 PM EDT by a video enabled telemedicine application and verified that I am speaking with the correct person using two identifiers.  Location: Patient: home Provider: home office   I discussed the limitations of evaluation and management by telemedicine and the availability of in person appointments. The patient expressed understanding and agreed to proceed.      I discussed the assessment and treatment plan with the patient. The patient was provided an opportunity to ask questions and all were answered. The patient agreed with the plan and demonstrated an understanding of the instructions.   The patient was advised to call back or seek an in-person evaluation if the symptoms worsen or if the condition fails to improve as anticipated.  I provided 20 minutes of non-face-to-face time during this encounter.      HPI:  53   years old currently single Caucasian female. Referred initially by primary care physician for management of sleep, anxiety  and OCD  On evaluation has been struggling with loneliness.  Her mom died 2 months ago.  Her dad died last year so she is going through grief if that she does not need grief counseling as of now she has support of her brother but to live separately in an apartment.  Patient has been living with her mom for many years and now living alone so she talked about other options of not being lonely and adding activities  In general she does not want to increase medication believes the medication is helping her depression She is also worried about the job because she wants to work from home and the boss gives her a hard time at times  he does agree that she can work from home but she is as of now rethinking if she would do that job Klonopin  Prozac  helps with anxiety  Aggravating factor:; finances, lonliness, brother sick, mom's death  Modifying factor: Brother and friend no psychosis     Past Medical History:  Past Medical History:  Diagnosis Date   Abnormal Pap smear of cervix 2009   Anxiety    Chronic headaches    Depression    Diastolic dysfunction 03/12/2016   Endometriosis    Genital warts    Heart murmur    Mitral valve prolapse    Late systolic prolapse of both mitral valve leaflets with moderate MR by echo 08/2023   OCD (obsessive compulsive disorder)    OSA (obstructive sleep apnea) 07/30/2010   Tachycardia     Past Surgical History:  Procedure Laterality Date   COLPOSCOPY  3/09   w/ECC-neg   FOOT SURGERY  2005   left   PELVIC LAPAROSCOPY Bilateral 11/13/03   laparoscopic removal bilateral endometriomas    Family Psychiatric History: Mom : depression and anxiety  Family History:  Family History  Problem Relation Age of Onset   Skin cancer Mother    Irritable bowel syndrome Mother    Colon cancer Father        dx in his  80's-metastasized   Colon polyps Brother 40       colon polyps   Heart disease Maternal Grandmother    Heart disease Maternal Grandfather  Heart disease Paternal Grandmother    Diabetes Paternal Grandfather    Heart disease Paternal Grandfather    Diabetes Other        grandfather   Hypertension Other        grandfather/grandmother   Esophageal cancer Neg Hx    Pancreatic cancer Neg Hx    Stomach cancer Neg Hx     Social History:   Social History   Socioeconomic History   Marital status: Single    Spouse name: Not on file   Number of children: 0   Years of education: Not on file   Highest education level: Not on file  Occupational History   Occupation: clerical at [employer]  Tobacco Use   Smoking status: Never   Smokeless tobacco: Never   Vaping Use   Vaping status: Never Used  Substance and Sexual Activity   Alcohol use: No   Drug use: No   Sexual activity: Not Currently    Partners: Male    Birth control/protection: Pill    Comment: yaz  Other Topics Concern   Not on file  Social History Narrative   Pt is single and lives with family.    Social Drivers of Corporate investment banker Strain: Not on file  Food Insecurity: Not on file  Transportation Needs: Not on file  Physical Activity: Not on file  Stress: Not on file  Social Connections: Not on file     Allergies:  No Known Allergies  Metabolic Disorder Labs: Lab Results  Component Value Date   HGBA1C 5.3 05/28/2023   MPG 99.67 05/01/2020   No results found for: PROLACTIN Lab Results  Component Value Date   CHOL 214 (H) 05/28/2023   TRIG 221.0 (H) 05/28/2023   HDL 62.90 05/28/2023   CHOLHDL 3 05/28/2023   VLDL 44.2 (H) 05/28/2023   LDLCALC 107 (H) 05/28/2023   LDLCALC 143 (H) 08/11/2022     Current Medications: Current Outpatient Medications  Medication Sig Dispense Refill   Apple Cider Vinegar 188 MG CAPS Take by mouth. 100 mg     B Complex Vitamins (B COMPLEX PO) Take 1 tablet by mouth daily.     BIOTIN PO Take 1,000 mg by mouth.     clonazePAM  (KLONOPIN ) 0.5 MG tablet Take 1 tablet (0.5 mg total) by mouth 2 (two) times daily as needed for anxiety. 30 tablet 0   DENTA 5000 PLUS 1.1 % CREA dental cream Take 1 application by mouth daily.     finasteride (PROSCAR) 5 MG tablet Take 2.5 mg by mouth daily.     Fluocinolone Acetonide Body 0.01 % OIL Apply 1 application  topically 2 (two) times daily.     FLUoxetine  (PROZAC ) 10 MG tablet Take 1 tablet (10 mg total) by mouth daily. 90 tablet 0   FLUoxetine  (PROZAC ) 20 MG tablet Take 2 tablets (40 mg total) by mouth daily. 180 tablet 0   GLUCOSA-CHONDR-NA CHONDR-MSM PO Take 1,500 mg by mouth daily.     Magnesium 100 MG CAPS Take 100 mg by mouth 2 (two) times daily.     Methylsulfonylmethane  (MSM) 1000 MG CAPS Take 1 capsule by mouth daily.     Multiple Vitamin (MULTIVITAMIN) tablet Take 1 tablet by mouth daily.     NIKKI  3-0.02 MG tablet TAKE 1 TABLET BY MOUTH EVERY DAY 84 tablet 3   No current facility-administered medications for this visit.      Psychiatric Specialty Exam: Review of Systems  Cardiovascular:  Negative for chest pain.  Psychiatric/Behavioral:  Negative for substance abuse and suicidal ideas.     There were no vitals taken for this visit.There is no height or weight on file to calculate BMI.  General Appearance: casual  Eye Contact:fair  Speech:  Normal Rate  Volume:  Decreased  Mood: fair somewhat subdued  Affect:  congruent  Thought Process:  Goal Directed  Orientation:  Full (Time, Place, and Person)  Thought Content:  Rumination  Suicidal Thoughts:  No  Homicidal Thoughts:  No  Memory:  Immediate;   Fair Recent;   Fair  Judgement:  Fair  Insight:  Fair  Psychomotor Activity:  Decreased  Concentration:  Concentration: Fair and Attention Span: Fair  Recall:  [employer] of Knowledge:Fair  Language: Fair  Akathisia:  Negative  Handed:  Right  AIMS (if indicated):    Assets:  Desire for Improvement  ADL's:  Intact  Cognition: WNL  Sleep:  poor    Treatment Plan Summary: Medication management and Plan as follows   Prior documentation reviewed   GAD and panic symptoms: Managing it stress related to being lonely.  Lost her mom provided supportive therapy, grief counseling but overall she feels medication are doing what they can and   OCD: baseline, continue prozac    Depression: Feels lonely going through grief but handling it has some friends and a brother continue medication she prefers to work from home and is looking into that job acceptance by the boss   Patient not psychotic or suicidal reviewed medication questions addressed follow-up in 3 months or earlier if needed  Jackey Flight, MD 8/11/20254:51 PM

## 2023-09-24 ENCOUNTER — Other Ambulatory Visit (HOSPITAL_COMMUNITY): Payer: Self-pay | Admitting: *Deleted

## 2023-09-24 DIAGNOSIS — F411 Generalized anxiety disorder: Secondary | ICD-10-CM

## 2023-09-24 MED ORDER — FLUOXETINE HCL 10 MG PO TABS: 10.0000 mg | ORAL_TABLET | Freq: Every day | ORAL | 0 refills | Status: AC

## 2023-09-29 ENCOUNTER — Telehealth (HOSPITAL_COMMUNITY): Payer: Self-pay | Admitting: *Deleted

## 2023-09-29 NOTE — Telephone Encounter (Signed)
 Spoke with patient and she stated that the 10mg  of [facility]ox[facility] [place]. Per pt she get the 20mg  from [employer]. But she do not need the 20mg  and just need the 10mg .

## 2023-09-29 NOTE — Telephone Encounter (Signed)
 [facility] [place] [facility] of [address] sent refill request for patient Fluoxetine  HCL 10mg . Per pt, chart, this medication was sent to [facility] on 09/24/2023 with 90 tablet 0 refill.  Staff called patient to see which pharmacy she's using due to medication being request from 2 different pharmacy. Staff was not able to reach patient and [facility] and office number was provided on voicemail.

## 2023-09-30 NOTE — Telephone Encounter (Signed)
 LMOM

## 2023-10-01 DIAGNOSIS — L6612 Frontal fibrosing alopecia: Secondary | ICD-10-CM | POA: Diagnosis not present

## 2023-10-01 DIAGNOSIS — L219 Seborrheic dermatitis, unspecified: Secondary | ICD-10-CM | POA: Diagnosis not present

## 2023-10-01 DIAGNOSIS — L649 Androgenic alopecia, unspecified: Secondary | ICD-10-CM | POA: Diagnosis not present

## 2023-10-05 ENCOUNTER — Encounter (HOSPITAL_BASED_OUTPATIENT_CLINIC_OR_DEPARTMENT_OTHER): Payer: Self-pay | Admitting: Obstetrics & Gynecology

## 2023-10-05 ENCOUNTER — Other Ambulatory Visit (HOSPITAL_COMMUNITY)
Admission: RE | Admit: 2023-10-05 | Discharge: 2023-10-05 | Disposition: A | Discharge status: Home / Self Care | Source: Ambulatory Visit | Attending: Obstetrics & Gynecology | Admitting: Obstetrics & Gynecology

## 2023-10-05 ENCOUNTER — Ambulatory Visit (HOSPITAL_BASED_OUTPATIENT_CLINIC_OR_DEPARTMENT_OTHER): Admitting: Obstetrics & Gynecology

## 2023-10-05 VITALS — BP 114/85 | HR 88 | Ht 62.0 in | Wt 143.4 lb

## 2023-10-05 DIAGNOSIS — Z124 Encounter for screening for malignant neoplasm of cervix: Secondary | ICD-10-CM | POA: Diagnosis not present

## 2023-10-05 DIAGNOSIS — E7800 Pure hypercholesterolemia, unspecified: Secondary | ICD-10-CM

## 2023-10-05 DIAGNOSIS — N951 Menopausal and female climacteric states: Secondary | ICD-10-CM | POA: Diagnosis not present

## 2023-10-05 DIAGNOSIS — R232 Flushing: Secondary | ICD-10-CM

## 2023-10-05 DIAGNOSIS — N809 Endometriosis, unspecified: Secondary | ICD-10-CM

## 2023-10-05 DIAGNOSIS — Z01419 Encounter for gynecological examination (general) (routine) without abnormal findings: Secondary | ICD-10-CM

## 2023-10-05 MED ORDER — DROSPIRENONE-ETHINYL ESTRADIOL 3-0.02 MG PO TABS: 1.0000 | ORAL_TABLET | Freq: Every day | ORAL | 3 refills | Status: AC

## 2023-10-05 NOTE — Progress Notes (Unsigned)
 ANNUAL EXAM Patient name: Frances Ho MRN [id number]  Date of birth: 10/26/1970 Chief Complaint:   Annual Exam  History of Present Illness:   Denyla Cortese [place] is a 53 y.o. G0P0 Caucasian female being seen today for a routine annual exam.  She lost both of her parents this past year.  Her mother died in 07-15-23.  She has been on contact with an old boyfriend.     No LMP recorded. (Menstrual status: Oral contraceptives).   Last pap 08/11/2022. Results were: ASCUS w/ HRHPV negative. H/O abnormal pap: no Last mammogram: 07/30/2023. Results were: normal. Family h/o breast cancer: no Last colonoscopy: 01/01/2021. Results were: normal. Family h/o colorectal cancer: yes father     08/11/2022    2:11 PM 07/25/2021    2:38 PM 11/22/2015   11:15 AM  Depression screen PHQ 2/9  Decreased Interest 0 0   Down, Depressed, Hopeless 0 0   PHQ - 2 Score 0 0   Altered sleeping     Tired, decreased energy     Change in appetite     Feeling bad or failure about yourself      Trouble concentrating     Moving slowly or fidgety/restless     Suicidal thoughts     PHQ-9 Score     Difficult doing work/chores        Information is confidential and restricted. Go to Review Flowsheets to unlock data.        11/22/2015   11:16 AM  GAD 7 : Generalized Anxiety Score  Nervous, Anxious, on Edge   Control/stop worrying   Worry too much - different things   Trouble relaxing   Restless   Easily annoyed or irritable   Afraid - awful might happen   Total GAD 7 Score   Anxiety Difficulty      Information is confidential and restricted. Go to Review Flowsheets to unlock data.     Review of Systems:   Pertinent items are noted in HPI Denies any headaches, blurred vision, fatigue, shortness of breath, chest pain, abdominal pain, abnormal vaginal discharge/itching/odor/irritation, problems with periods, bowel movements, urination, or intercourse unless otherwise stated above. Pertinent History Reviewed:   Reviewed past medical,surgical, social and family history.  Reviewed problem list, medications and allergies. Physical Assessment:   Vitals:   10/05/23 1605  BP: 114/85  Pulse: 88  SpO2: 100%  Weight: 143 lb 6.4 oz (65 kg)  Height: 5' 2 (1.575 m)  Body mass index is 26.23 kg/m.        Physical Examination:   General appearance - well appearing, and in no distress  Mental status - alert, oriented to person, place, and time  Psych:  She has a normal mood and affect  Skin - warm and dry, normal color, no suspicious lesions noted  Chest - effort normal, all lung fields clear to auscultation bilaterally  Heart - normal rate and regular rhythm  Neck:  midline trachea, no thyromegaly or nodules  Breasts - breasts appear normal, no suspicious masses, no skin or nipple changes or  axillary nodes  Abdomen - soft, nontender, nondistended, no masses or organomegaly  Pelvic - VULVA: normal appearing vulva with no masses, tenderness or lesions   VAGINA: normal appearing vagina with normal color and discharge, no lesions   CERVIX: normal appearing cervix without discharge or lesions, no CMT  Thin prep pap is done without HPV  UTERUS: uterus is felt to be normal size, shape, consistency and  nontender   ADNEXA: No adnexal masses or tenderness noted.  Rectal - normal rectal, good sphincter tone, no masses felt.   Extremities:  No swelling or varicosities noted  Chaperone present for exam  No results found for this or any previous visit (from the past 24 hours).  Assessment & Plan:  1. Well woman exam with routine gynecological exam (Primary) ***  2. Cervical cancer screening *** - Cytology - PAP( [place]) - drospirenone -ethinyl estradiol  (NIKKI ) 3-0.02 MG tablet; Take 1 tablet by mouth daily.  Dispense: 84 tablet; Refill: 3  3. Perimenopausal vasomotor symptoms *** - Follicle stimulating hormone; Future  4. Elevated LDL cholesterol level *** - Lipid panel; Future  5.  Endometriosis *** - drospirenone -ethinyl estradiol  (NIKKI ) 3-0.02 MG tablet; Take 1 tablet by mouth daily.  Dispense: 84 tablet; Refill: 3   No orders of the defined types were placed in this encounter.   Meds: No orders of the defined types were placed in this encounter.   Follow-up: No follow-ups on file.  Sprague, Kaitlyn E, RN 10/05/2023 4:08 PM

## 2023-10-07 ENCOUNTER — Telehealth (HOSPITAL_COMMUNITY): Payer: Self-pay | Admitting: Psychiatry

## 2023-10-07 NOTE — Telephone Encounter (Signed)
 Patient left voicemail requesting update on refill request from last week for  FLUoxetine  (PROZAC ) 10 MG tablet . Reviewed record, contacted [facility] and [facility] informed [facility] [place] [facility] and request transfer of this prescription [facility]. [facility] and informed of [facility]. Left voicemail for patient with this update.

## 2023-10-08 ENCOUNTER — Encounter (HOSPITAL_BASED_OUTPATIENT_CLINIC_OR_DEPARTMENT_OTHER): Payer: Self-pay | Admitting: Obstetrics & Gynecology

## 2023-10-08 ENCOUNTER — Ambulatory Visit (HOSPITAL_BASED_OUTPATIENT_CLINIC_OR_DEPARTMENT_OTHER): Payer: Self-pay | Admitting: Obstetrics & Gynecology

## 2023-10-08 LAB — CYTOLOGY - PAP: Diagnosis: NEGATIVE

## 2023-11-09 ENCOUNTER — Other Ambulatory Visit (HOSPITAL_BASED_OUTPATIENT_CLINIC_OR_DEPARTMENT_OTHER): Payer: Self-pay

## 2023-11-09 DIAGNOSIS — E7800 Pure hypercholesterolemia, unspecified: Secondary | ICD-10-CM | POA: Diagnosis not present

## 2023-11-09 DIAGNOSIS — N951 Menopausal and female climacteric states: Secondary | ICD-10-CM

## 2023-11-10 LAB — FOLLICLE STIMULATING HORMONE: FSH: 1.8 m[IU]/mL

## 2023-11-10 LAB — LIPID PANEL
Chol/HDL Ratio: 3.8 ratio (ref 0.0–4.4)
Cholesterol, Total: 242 mg/dL — ABNORMAL HIGH (ref 100–199)
HDL: 64 mg/dL (ref >39)
LDL Chol Calc (NIH): 146 mg/dL — ABNORMAL HIGH (ref 0–99)
Triglycerides: 181 mg/dL — ABNORMAL HIGH (ref 0–149)
VLDL Cholesterol Cal: 32 mg/dL (ref 5–40)

## 2023-11-12 ENCOUNTER — Ambulatory Visit (HOSPITAL_BASED_OUTPATIENT_CLINIC_OR_DEPARTMENT_OTHER): Payer: Self-pay | Admitting: Obstetrics & Gynecology

## 2023-12-17 ENCOUNTER — Other Ambulatory Visit (HOSPITAL_COMMUNITY): Payer: Self-pay

## 2023-12-18 MED ORDER — FLUOXETINE HCL 20 MG PO TABS: 40.0000 mg | ORAL_TABLET | Freq: Every day | ORAL | 0 refills | Status: AC

## 2024-01-04 ENCOUNTER — Telehealth (HOSPITAL_COMMUNITY): Admitting: Psychiatry

## 2024-01-25 ENCOUNTER — Telehealth (HOSPITAL_COMMUNITY): Admitting: Psychiatry

## 2024-02-01 ENCOUNTER — Telehealth (HOSPITAL_COMMUNITY): Admitting: Psychiatry

## 2024-02-01 ENCOUNTER — Encounter (HOSPITAL_COMMUNITY): Payer: Self-pay

## 2024-02-16 ENCOUNTER — Telehealth (HOSPITAL_COMMUNITY): Admitting: Psychiatry

## 2024-02-29 ENCOUNTER — Encounter (HOSPITAL_COMMUNITY): Payer: Self-pay | Admitting: Psychiatry

## 2024-02-29 ENCOUNTER — Telehealth (HOSPITAL_COMMUNITY): Admitting: Psychiatry

## 2024-02-29 DIAGNOSIS — F411 Generalized anxiety disorder: Secondary | ICD-10-CM

## 2024-02-29 DIAGNOSIS — F429 Obsessive-compulsive disorder, unspecified: Secondary | ICD-10-CM

## 2024-02-29 DIAGNOSIS — Z9189 Other specified personal risk factors, not elsewhere classified: Secondary | ICD-10-CM

## 2024-05-30 ENCOUNTER — Telehealth (HOSPITAL_COMMUNITY): Admitting: Psychiatry
